# Patient Record
Sex: Female | Born: 1955 | Race: Black or African American | Hispanic: No | Smoking: Former smoker
Health system: Southern US, Community
[De-identification: ages and names within clinical notes are randomized; demographics above are authoritative.]

## PROBLEM LIST (undated history)

## (undated) DIAGNOSIS — M797 Fibromyalgia: Secondary | ICD-10-CM

## (undated) DIAGNOSIS — C50919 Malignant neoplasm of unspecified site of unspecified female breast: Secondary | ICD-10-CM

## (undated) DIAGNOSIS — E213 Hyperparathyroidism, unspecified: Secondary | ICD-10-CM

## (undated) DIAGNOSIS — I82409 Acute embolism and thrombosis of unspecified deep veins of unspecified lower extremity: Secondary | ICD-10-CM

## (undated) DIAGNOSIS — Z8673 Personal history of transient ischemic attack (TIA), and cerebral infarction without residual deficits: Secondary | ICD-10-CM

## (undated) DIAGNOSIS — E119 Type 2 diabetes mellitus without complications: Secondary | ICD-10-CM

## (undated) DIAGNOSIS — I5030 Unspecified diastolic (congestive) heart failure: Secondary | ICD-10-CM

## (undated) DIAGNOSIS — I10 Essential (primary) hypertension: Secondary | ICD-10-CM

## (undated) DIAGNOSIS — N289 Disorder of kidney and ureter, unspecified: Secondary | ICD-10-CM

## (undated) DIAGNOSIS — I639 Cerebral infarction, unspecified: Secondary | ICD-10-CM

## (undated) DIAGNOSIS — N189 Chronic kidney disease, unspecified: Secondary | ICD-10-CM

## (undated) HISTORY — DX: Hyperparathyroidism, unspecified: E21.3

## (undated) HISTORY — PX: OTHER SURGICAL HISTORY: SHX169

## (undated) HISTORY — DX: Acute embolism and thrombosis of unspecified deep veins of unspecified lower extremity: I82.409

## (undated) HISTORY — DX: Unspecified diastolic (congestive) heart failure: I50.30

## (undated) HISTORY — PX: APPENDECTOMY: SHX54

## (undated) HISTORY — DX: Type 2 diabetes mellitus without complications: E11.9

## (undated) HISTORY — DX: Cerebral infarction, unspecified: I63.9

## (undated) HISTORY — DX: Malignant neoplasm of unspecified site of unspecified female breast: C50.919

## (undated) HISTORY — DX: Fibromyalgia: M79.7

## (undated) HISTORY — DX: Chronic kidney disease, unspecified: N18.9

## (undated) HISTORY — DX: Personal history of transient ischemic attack (TIA), and cerebral infarction without residual deficits: Z86.73

## (undated) HISTORY — DX: Essential (primary) hypertension: I10

## (undated) HISTORY — PX: BREAST BIOPSY: SHX20

---

## 2012-05-27 ENCOUNTER — Inpatient Hospital Stay: Payer: Self-pay | Admitting: Internal Medicine

## 2012-05-27 LAB — COMPREHENSIVE METABOLIC PANEL
Albumin: 2.2 g/dL — ABNORMAL LOW (ref 3.4–5.0)
Alkaline Phosphatase: 110 U/L (ref 50–136)
Anion Gap: 6 — ABNORMAL LOW (ref 7–16)
Bilirubin,Total: 0.2 mg/dL (ref 0.2–1.0)
Calcium, Total: 8.3 mg/dL — ABNORMAL LOW (ref 8.5–10.1)
Co2: 25 mmol/L (ref 21–32)
Creatinine: 1.83 mg/dL — ABNORMAL HIGH (ref 0.60–1.30)
Glucose: 86 mg/dL (ref 65–99)
Osmolality: 290 (ref 275–301)
Potassium: 3.8 mmol/L (ref 3.5–5.1)
SGPT (ALT): 39 U/L (ref 12–78)
Sodium: 145 mmol/L (ref 136–145)
Total Protein: 6.8 g/dL (ref 6.4–8.2)

## 2012-05-27 LAB — URINALYSIS, COMPLETE
Bacteria: NONE SEEN
Blood: NEGATIVE
Nitrite: NEGATIVE
RBC,UR: 1 /HPF (ref 0–5)
Specific Gravity: 1.022 (ref 1.003–1.030)
WBC UR: 3 /HPF (ref 0–5)

## 2012-05-27 LAB — DRUG SCREEN, URINE
Amphetamines, Ur Screen: NEGATIVE (ref ?–1000)
Barbiturates, Ur Screen: NEGATIVE (ref ?–200)
Benzodiazepine, Ur Scrn: NEGATIVE (ref ?–200)
Cannabinoid 50 Ng, Ur ~~LOC~~: NEGATIVE (ref ?–50)
Cocaine Metabolite,Ur ~~LOC~~: POSITIVE (ref ?–300)
MDMA (Ecstasy)Ur Screen: NEGATIVE (ref ?–500)
Methadone, Ur Screen: NEGATIVE (ref ?–300)
Phencyclidine (PCP) Ur S: NEGATIVE (ref ?–25)
Tricyclic, Ur Screen: NEGATIVE (ref ?–1000)

## 2012-05-27 LAB — CBC
HGB: 12.1 g/dL (ref 12.0–16.0)
MCH: 29.3 pg (ref 26.0–34.0)
MCV: 88 fL (ref 80–100)
Platelet: 175 10*3/uL (ref 150–440)
RBC: 4.12 10*6/uL (ref 3.80–5.20)

## 2012-05-27 LAB — TROPONIN I: Troponin-I: 0.06 ng/mL — ABNORMAL HIGH

## 2012-05-27 LAB — CK TOTAL AND CKMB (NOT AT ARMC)
CK, Total: 222 U/L — ABNORMAL HIGH (ref 21–215)
CK-MB: 2.4 ng/mL (ref 0.5–3.6)

## 2012-05-28 LAB — CBC WITH DIFFERENTIAL/PLATELET
Basophil #: 0 10*3/uL (ref 0.0–0.1)
Basophil %: 0.5 %
Eosinophil %: 4.1 %
HCT: 29.7 % — ABNORMAL LOW (ref 35.0–47.0)
Lymphocyte #: 2.8 10*3/uL (ref 1.0–3.6)
Lymphocyte %: 57.3 %
MCHC: 34.2 g/dL (ref 32.0–36.0)
Monocyte #: 0.5 x10 3/mm (ref 0.2–0.9)
Monocyte %: 10.9 %
Neutrophil #: 1.3 10*3/uL — ABNORMAL LOW (ref 1.4–6.5)
Platelet: 136 10*3/uL — ABNORMAL LOW (ref 150–440)
RBC: 3.38 10*6/uL — ABNORMAL LOW (ref 3.80–5.20)
RDW: 14.4 % (ref 11.5–14.5)

## 2012-05-28 LAB — BASIC METABOLIC PANEL
Chloride: 114 mmol/L — ABNORMAL HIGH (ref 98–107)
Co2: 26 mmol/L (ref 21–32)
Creatinine: 2.06 mg/dL — ABNORMAL HIGH (ref 0.60–1.30)
EGFR (African American): 30 — ABNORMAL LOW
Glucose: 72 mg/dL (ref 65–99)
Osmolality: 289 (ref 275–301)
Sodium: 144 mmol/L (ref 136–145)

## 2012-05-28 LAB — LIPID PANEL
Cholesterol: 200 mg/dL (ref 0–200)
Ldl Cholesterol, Calc: 105 mg/dL — ABNORMAL HIGH (ref 0–100)
Triglycerides: 98 mg/dL (ref 0–200)
VLDL Cholesterol, Calc: 20 mg/dL (ref 5–40)

## 2012-05-28 LAB — TROPONIN I: Troponin-I: 0.06 ng/mL — ABNORMAL HIGH

## 2012-05-29 DIAGNOSIS — I369 Nonrheumatic tricuspid valve disorder, unspecified: Secondary | ICD-10-CM

## 2012-06-20 ENCOUNTER — Emergency Department: Payer: Self-pay | Admitting: Emergency Medicine

## 2012-06-20 LAB — CBC
HCT: 30.4 % — ABNORMAL LOW (ref 35.0–47.0)
HGB: 10.1 g/dL — ABNORMAL LOW (ref 12.0–16.0)
MCH: 29.3 pg (ref 26.0–34.0)
MCV: 88 fL (ref 80–100)
RDW: 13.6 % (ref 11.5–14.5)
WBC: 5.7 10*3/uL (ref 3.6–11.0)

## 2012-06-20 LAB — COMPREHENSIVE METABOLIC PANEL
Albumin: 2.3 g/dL — ABNORMAL LOW (ref 3.4–5.0)
Alkaline Phosphatase: 119 U/L (ref 50–136)
Anion Gap: 4 — ABNORMAL LOW (ref 7–16)
BUN: 36 mg/dL — ABNORMAL HIGH (ref 7–18)
Bilirubin,Total: 0.2 mg/dL (ref 0.2–1.0)
Calcium, Total: 8.3 mg/dL — ABNORMAL LOW (ref 8.5–10.1)
Chloride: 114 mmol/L — ABNORMAL HIGH (ref 98–107)
Co2: 23 mmol/L (ref 21–32)
Creatinine: 2.46 mg/dL — ABNORMAL HIGH (ref 0.60–1.30)
EGFR (African American): 24 — ABNORMAL LOW
EGFR (Non-African Amer.): 21 — ABNORMAL LOW
Glucose: 94 mg/dL (ref 65–99)
Osmolality: 289 (ref 275–301)
Potassium: 4.1 mmol/L (ref 3.5–5.1)
SGOT(AST): 41 U/L — ABNORMAL HIGH (ref 15–37)
SGPT (ALT): 30 U/L (ref 12–78)
Sodium: 141 mmol/L (ref 136–145)
Total Protein: 6.7 g/dL (ref 6.4–8.2)

## 2012-06-20 LAB — PRO B NATRIURETIC PEPTIDE: B-Type Natriuretic Peptide: 3170 pg/mL — ABNORMAL HIGH (ref 0–125)

## 2012-06-25 LAB — PRO B NATRIURETIC PEPTIDE: B-Type Natriuretic Peptide: 3717 pg/mL — ABNORMAL HIGH (ref 0–125)

## 2012-06-25 LAB — URINALYSIS, COMPLETE
Bilirubin,UR: NEGATIVE
Ketone: NEGATIVE
Nitrite: NEGATIVE
Protein: 100
Specific Gravity: 1.009 (ref 1.003–1.030)
Transitional Epi: <1
WBC UR: 1 /HPF (ref 0–5)

## 2012-06-25 LAB — COMPREHENSIVE METABOLIC PANEL
Albumin: 2.4 g/dL — ABNORMAL LOW (ref 3.4–5.0)
Alkaline Phosphatase: 116 U/L (ref 50–136)
Anion Gap: 6 — ABNORMAL LOW (ref 7–16)
Bilirubin,Total: 0.2 mg/dL (ref 0.2–1.0)
Calcium, Total: 8.4 mg/dL — ABNORMAL LOW (ref 8.5–10.1)
Creatinine: 1.81 mg/dL — ABNORMAL HIGH (ref 0.60–1.30)
EGFR (Non-African Amer.): 30 — ABNORMAL LOW
Glucose: 72 mg/dL (ref 65–99)
SGPT (ALT): 28 U/L (ref 12–78)
Sodium: 142 mmol/L (ref 136–145)

## 2012-06-25 LAB — CBC
HGB: 11 g/dL — ABNORMAL LOW (ref 12.0–16.0)
MCHC: 33.3 g/dL (ref 32.0–36.0)
Platelet: 192 10*3/uL (ref 150–440)
RDW: 13.8 % (ref 11.5–14.5)

## 2012-06-26 ENCOUNTER — Inpatient Hospital Stay: Payer: Self-pay | Admitting: Internal Medicine

## 2012-06-26 DIAGNOSIS — R780 Finding of alcohol in blood: Secondary | ICD-10-CM

## 2012-06-26 LAB — DRUG SCREEN, URINE
Amphetamines, Ur Screen: NEGATIVE (ref ?–1000)
Barbiturates, Ur Screen: NEGATIVE (ref ?–200)
Cocaine Metabolite,Ur ~~LOC~~: NEGATIVE (ref ?–300)
Opiate, Ur Screen: POSITIVE (ref ?–300)
Phencyclidine (PCP) Ur S: NEGATIVE (ref ?–25)
Tricyclic, Ur Screen: NEGATIVE (ref ?–1000)

## 2012-06-26 LAB — BASIC METABOLIC PANEL
Anion Gap: 7 (ref 7–16)
Co2: 23 mmol/L (ref 21–32)
Creatinine: 2.11 mg/dL — ABNORMAL HIGH (ref 0.60–1.30)
EGFR (African American): 29 — ABNORMAL LOW
EGFR (Non-African Amer.): 25 — ABNORMAL LOW
Glucose: 150 mg/dL — ABNORMAL HIGH (ref 65–99)
Osmolality: 286 (ref 275–301)
Potassium: 3.9 mmol/L (ref 3.5–5.1)
Sodium: 140 mmol/L (ref 136–145)

## 2012-06-26 LAB — TROPONIN I: Troponin-I: 0.41 ng/mL — ABNORMAL HIGH

## 2012-06-29 LAB — CREATININE, SERUM
EGFR (African American): 26 — ABNORMAL LOW
EGFR (Non-African Amer.): 23 — ABNORMAL LOW

## 2012-06-30 ENCOUNTER — Telehealth: Payer: Self-pay

## 2012-06-30 NOTE — Telephone Encounter (Signed)
TCM  

## 2012-06-30 NOTE — Telephone Encounter (Signed)
Message copied by [facility], Lynnelle Mesmer E on Fri Jun 30, 2012  9:30 AM ------      Message from: Coralee Rud      Created: Thu Jun 29, 2012 12:33 PM      Regarding: tcm/ph       07/18/12 2pm dr Kirke Corin ------

## 2012-06-30 NOTE — Telephone Encounter (Signed)
No answer TCM #1 attempt

## 2012-07-03 NOTE — Telephone Encounter (Signed)
TCM #2 No answer

## 2012-07-04 ENCOUNTER — Encounter: Payer: Self-pay | Admitting: *Deleted

## 2012-07-09 ENCOUNTER — Emergency Department: Payer: Self-pay | Admitting: Emergency Medicine

## 2012-07-18 ENCOUNTER — Encounter: Payer: Medicaid Other | Admitting: Cardiovascular Disease

## 2012-08-09 ENCOUNTER — Inpatient Hospital Stay: Payer: Self-pay | Admitting: Internal Medicine

## 2012-08-09 LAB — COMPREHENSIVE METABOLIC PANEL
Albumin: 2.9 g/dL — ABNORMAL LOW (ref 3.4–5.0)
Alkaline Phosphatase: 95 U/L (ref 50–136)
BUN: 19 mg/dL — ABNORMAL HIGH (ref 7–18)
Bilirubin,Total: 0.4 mg/dL (ref 0.2–1.0)
Calcium, Total: 8.5 mg/dL (ref 8.5–10.1)
Chloride: 110 mmol/L — ABNORMAL HIGH (ref 98–107)
EGFR (Non-African Amer.): 27 — ABNORMAL LOW
Glucose: 84 mg/dL (ref 65–99)
Osmolality: 283 (ref 275–301)
Potassium: 4.9 mmol/L (ref 3.5–5.1)
Total Protein: 7.2 g/dL (ref 6.4–8.2)

## 2012-08-09 LAB — URINALYSIS, COMPLETE
Bilirubin,UR: NEGATIVE
Glucose,UR: 50 mg/dL (ref 0–75)
Ketone: NEGATIVE
Nitrite: NEGATIVE
Ph: 7 (ref 4.5–8.0)
RBC,UR: <1 /HPF (ref 0–5)
Specific Gravity: 1.016 (ref 1.003–1.030)
Squamous Epithelial: 1

## 2012-08-09 LAB — CBC
HCT: 31.4 % — ABNORMAL LOW (ref 35.0–47.0)
HGB: 10.3 g/dL — ABNORMAL LOW (ref 12.0–16.0)
MCHC: 32.8 g/dL (ref 32.0–36.0)
Platelet: 198 10*3/uL (ref 150–440)
RBC: 3.56 10*6/uL — ABNORMAL LOW (ref 3.80–5.20)

## 2012-08-09 LAB — PROTIME-INR: Prothrombin Time: 12.6 secs (ref 11.5–14.7)

## 2012-08-10 LAB — LIPID PANEL
Cholesterol: 142 mg/dL (ref 0–200)
HDL Cholesterol: 79 mg/dL — ABNORMAL HIGH (ref 40–60)
Ldl Cholesterol, Calc: 47 mg/dL (ref 0–100)
VLDL Cholesterol, Calc: 16 mg/dL (ref 5–40)

## 2012-08-12 ENCOUNTER — Emergency Department: Payer: Self-pay | Admitting: Internal Medicine

## 2012-08-12 LAB — CBC WITH DIFFERENTIAL/PLATELET
HCT: 29.5 % — ABNORMAL LOW (ref 35.0–47.0)
HGB: 9.5 g/dL — ABNORMAL LOW (ref 12.0–16.0)
Lymphocyte #: 2.3 10*3/uL (ref 1.0–3.6)
Lymphocyte %: 46.1 %
MCH: 28.8 pg (ref 26.0–34.0)
MCHC: 32.3 g/dL (ref 32.0–36.0)
MCV: 89 fL (ref 80–100)
Neutrophil #: 1.8 10*3/uL (ref 1.4–6.5)
Neutrophil %: 35.7 %
RBC: 3.31 10*6/uL — ABNORMAL LOW (ref 3.80–5.20)

## 2012-08-12 LAB — TSH: Thyroid Stimulating Horm: 1.08 u[IU]/mL

## 2012-08-12 LAB — BASIC METABOLIC PANEL
Anion Gap: 6 — ABNORMAL LOW (ref 7–16)
BUN: 21 mg/dL — ABNORMAL HIGH (ref 7–18)
Chloride: 112 mmol/L — ABNORMAL HIGH (ref 98–107)
Creatinine: 2.35 mg/dL — ABNORMAL HIGH (ref 0.60–1.30)
EGFR (Non-African Amer.): 22 — ABNORMAL LOW
Glucose: 83 mg/dL (ref 65–99)
Potassium: 3.7 mmol/L (ref 3.5–5.1)

## 2012-08-12 LAB — URINALYSIS, COMPLETE
Bacteria: NONE SEEN
Blood: NEGATIVE
Glucose,UR: NEGATIVE mg/dL (ref 0–75)
Leukocyte Esterase: NEGATIVE
Nitrite: NEGATIVE
Protein: 100
RBC,UR: 1 /HPF (ref 0–5)
Specific Gravity: 1.006 (ref 1.003–1.030)
Squamous Epithelial: <1

## 2012-08-12 LAB — DRUG SCREEN, URINE

## 2012-08-12 LAB — HEPATIC FUNCTION PANEL A (ARMC)
Alkaline Phosphatase: 89 U/L (ref 50–136)
Bilirubin, Direct: 0.1 mg/dL (ref 0.00–0.20)
SGPT (ALT): 21 U/L (ref 12–78)

## 2012-08-12 LAB — TROPONIN I: Troponin-I: <0.02 ng/mL

## 2012-08-12 LAB — ACETAMINOPHEN LEVEL: Acetaminophen: <2 ug/mL

## 2012-09-21 ENCOUNTER — Emergency Department: Payer: Self-pay | Admitting: Emergency Medicine

## 2012-09-21 LAB — CBC
HGB: 11.8 g/dL — ABNORMAL LOW (ref 12.0–16.0)
MCH: 30.3 pg (ref 26.0–34.0)
MCHC: 34.2 g/dL (ref 32.0–36.0)
MCV: 89 fL (ref 80–100)
RBC: 3.9 10*6/uL (ref 3.80–5.20)

## 2012-09-21 LAB — BASIC METABOLIC PANEL
Calcium, Total: 8.7 mg/dL (ref 8.5–10.1)
Chloride: 110 mmol/L — ABNORMAL HIGH (ref 98–107)
Co2: 24 mmol/L (ref 21–32)
EGFR (African American): 29 — ABNORMAL LOW
EGFR (Non-African Amer.): 25 — ABNORMAL LOW
Glucose: 88 mg/dL (ref 65–99)
Osmolality: 283 (ref 275–301)
Potassium: 3.8 mmol/L (ref 3.5–5.1)

## 2012-09-21 LAB — CK TOTAL AND CKMB (NOT AT ARMC)
CK, Total: 83 U/L (ref 21–215)
CK-MB: 1.5 ng/mL (ref 0.5–3.6)

## 2012-09-21 LAB — URINALYSIS, COMPLETE
Bacteria: NONE SEEN
Bilirubin,UR: NEGATIVE
Blood: NEGATIVE
Ketone: NEGATIVE
Specific Gravity: 1.02 (ref 1.003–1.030)
WBC UR: 2 /HPF (ref 0–5)

## 2012-12-08 ENCOUNTER — Ambulatory Visit: Payer: Self-pay | Admitting: Nephrology

## 2013-03-14 ENCOUNTER — Ambulatory Visit: Payer: Self-pay | Admitting: Family Medicine

## 2013-03-27 ENCOUNTER — Ambulatory Visit: Payer: Self-pay | Admitting: Family Medicine

## 2013-03-30 LAB — PATHOLOGY REPORT

## 2013-04-16 ENCOUNTER — Ambulatory Visit: Payer: Self-pay | Admitting: Surgery

## 2013-04-16 LAB — BASIC METABOLIC PANEL
ANION GAP: 6 — AB (ref 7–16)
BUN: 28 mg/dL — ABNORMAL HIGH (ref 7–18)
CALCIUM: 8.2 mg/dL — AB (ref 8.5–10.1)
CO2: 23 mmol/L (ref 21–32)
Chloride: 113 mmol/L — ABNORMAL HIGH (ref 98–107)
Creatinine: 2.78 mg/dL — ABNORMAL HIGH (ref 0.60–1.30)
GFR CALC AF AMER: 21 — AB
GFR CALC NON AF AMER: 18 — AB
Glucose: 230 mg/dL — ABNORMAL HIGH (ref 65–99)
Osmolality: 296 (ref 275–301)
Potassium: 4 mmol/L (ref 3.5–5.1)
SODIUM: 142 mmol/L (ref 136–145)

## 2013-04-16 LAB — CBC
HCT: 32.1 % — AB (ref 35.0–47.0)
HGB: 10.8 g/dL — AB (ref 12.0–16.0)
MCH: 29.3 pg (ref 26.0–34.0)
MCHC: 33.7 g/dL (ref 32.0–36.0)
MCV: 87 fL (ref 80–100)
Platelet: 166 10*3/uL (ref 150–440)
RBC: 3.69 10*6/uL — ABNORMAL LOW (ref 3.80–5.20)
RDW: 13.6 % (ref 11.5–14.5)
WBC: 5.3 10*3/uL (ref 3.6–11.0)

## 2013-04-16 LAB — PROTIME-INR
INR: 1
Prothrombin Time: 13 secs (ref 11.5–14.7)

## 2013-05-25 ENCOUNTER — Ambulatory Visit: Payer: Self-pay | Admitting: Surgery

## 2013-05-25 HISTORY — PX: BREAST LUMPECTOMY: SHX2

## 2013-05-25 LAB — DRUG SCREEN, URINE

## 2013-06-04 LAB — PATHOLOGY REPORT

## 2013-06-08 ENCOUNTER — Ambulatory Visit: Payer: Self-pay | Admitting: Internal Medicine

## 2013-06-13 ENCOUNTER — Ambulatory Visit: Payer: Self-pay | Admitting: Internal Medicine

## 2013-06-13 DIAGNOSIS — I059 Rheumatic mitral valve disease, unspecified: Secondary | ICD-10-CM

## 2013-06-19 ENCOUNTER — Ambulatory Visit: Payer: Self-pay | Admitting: Surgery

## 2013-06-25 ENCOUNTER — Ambulatory Visit: Payer: Self-pay | Admitting: Internal Medicine

## 2013-06-26 LAB — CBC CANCER CENTER
Basophil #: 0 x10 3/mm (ref 0.0–0.1)
Basophil %: 0.5 %
Eosinophil #: 0.3 x10 3/mm (ref 0.0–0.7)
Eosinophil %: 5 %
HCT: 32.2 % — AB (ref 35.0–47.0)
HGB: 10.5 g/dL — AB (ref 12.0–16.0)
LYMPHS PCT: 48.6 %
Lymphocyte #: 3.2 x10 3/mm (ref 1.0–3.6)
MCH: 28.2 pg (ref 26.0–34.0)
MCHC: 32.5 g/dL (ref 32.0–36.0)
MCV: 87 fL (ref 80–100)
MONO ABS: 0.6 x10 3/mm (ref 0.2–0.9)
Monocyte %: 10 %
NEUTROS ABS: 2.3 x10 3/mm (ref 1.4–6.5)
Neutrophil %: 35.9 %
Platelet: 201 x10 3/mm (ref 150–440)
RBC: 3.72 10*6/uL — AB (ref 3.80–5.20)
RDW: 14.5 % (ref 11.5–14.5)
WBC: 6.5 x10 3/mm (ref 3.6–11.0)

## 2013-06-26 LAB — HEPATIC FUNCTION PANEL A (ARMC)
Albumin: 2.7 g/dL — ABNORMAL LOW (ref 3.4–5.0)
Alkaline Phosphatase: 96 U/L
BILIRUBIN DIRECT: 0.1 mg/dL (ref 0.00–0.20)
Bilirubin,Total: 0.2 mg/dL (ref 0.2–1.0)
SGOT(AST): 29 U/L (ref 15–37)
SGPT (ALT): 30 U/L (ref 12–78)
TOTAL PROTEIN: 7.4 g/dL (ref 6.4–8.2)

## 2013-06-26 LAB — CREATININE, SERUM
Creatinine: 3.26 mg/dL — ABNORMAL HIGH (ref 0.60–1.30)
EGFR (African American): 17 — ABNORMAL LOW
GFR CALC NON AF AMER: 15 — AB

## 2013-06-29 ENCOUNTER — Telehealth: Payer: Self-pay

## 2013-06-29 NOTE — Telephone Encounter (Signed)
LMOM to find out PCP

## 2013-07-02 ENCOUNTER — Ambulatory Visit: Payer: Self-pay | Admitting: Nephrology

## 2013-07-03 LAB — CBC CANCER CENTER
BASOS PCT: 0.7 %
Basophil #: 0 x10 3/mm (ref 0.0–0.1)
Eosinophil #: 0.3 x10 3/mm (ref 0.0–0.7)
Eosinophil %: 5.9 %
HCT: 33.5 % — ABNORMAL LOW (ref 35.0–47.0)
HGB: 11 g/dL — AB (ref 12.0–16.0)
LYMPHS ABS: 2.6 x10 3/mm (ref 1.0–3.6)
Lymphocyte %: 45.6 %
MCH: 28.5 pg (ref 26.0–34.0)
MCHC: 32.9 g/dL (ref 32.0–36.0)
MCV: 86 fL (ref 80–100)
Monocyte #: 0.6 x10 3/mm (ref 0.2–0.9)
Monocyte %: 9.9 %
Neutrophil #: 2.1 x10 3/mm (ref 1.4–6.5)
Neutrophil %: 37.9 %
Platelet: 229 x10 3/mm (ref 150–440)
RBC: 3.87 10*6/uL (ref 3.80–5.20)
RDW: 14.2 % (ref 11.5–14.5)
WBC: 5.7 x10 3/mm (ref 3.6–11.0)

## 2013-07-03 LAB — BASIC METABOLIC PANEL
Anion Gap: 9 (ref 7–16)
BUN: 39 mg/dL — AB (ref 7–18)
CHLORIDE: 109 mmol/L — AB (ref 98–107)
Calcium, Total: 8.5 mg/dL (ref 8.5–10.1)
Co2: 23 mmol/L (ref 21–32)
Creatinine: 3.32 mg/dL — ABNORMAL HIGH (ref 0.60–1.30)
EGFR (African American): 17 — ABNORMAL LOW
GFR CALC NON AF AMER: 15 — AB
Glucose: 105 mg/dL — ABNORMAL HIGH (ref 65–99)
Osmolality: 291 (ref 275–301)
POTASSIUM: 4.8 mmol/L (ref 3.5–5.1)
Sodium: 141 mmol/L (ref 136–145)

## 2013-07-10 LAB — CBC CANCER CENTER
BASOS ABS: 0 x10 3/mm (ref 0.0–0.1)
Basophil %: 0.6 %
Eosinophil #: 0.4 x10 3/mm (ref 0.0–0.7)
Eosinophil %: 12.5 %
HCT: 30 % — AB (ref 35.0–47.0)
HGB: 9.7 g/dL — AB (ref 12.0–16.0)
Lymphocyte #: 1.4 x10 3/mm (ref 1.0–3.6)
Lymphocyte %: 49.9 %
MCH: 28.3 pg (ref 26.0–34.0)
MCHC: 32.3 g/dL (ref 32.0–36.0)
MCV: 88 fL (ref 80–100)
MONOS PCT: 14.9 %
Monocyte #: 0.4 x10 3/mm (ref 0.2–0.9)
NEUTROS ABS: 0.6 x10 3/mm — AB (ref 1.4–6.5)
NEUTROS PCT: 22.1 %
PLATELETS: 91 x10 3/mm — AB (ref 150–440)
RBC: 3.42 10*6/uL — ABNORMAL LOW (ref 3.80–5.20)
RDW: 14 % (ref 11.5–14.5)
WBC: 2.8 x10 3/mm — AB (ref 3.6–11.0)

## 2013-07-10 LAB — HEPATIC FUNCTION PANEL A (ARMC)
ALT: 34 U/L (ref 12–78)
AST: 26 U/L (ref 15–37)
Albumin: 2.5 g/dL — ABNORMAL LOW (ref 3.4–5.0)
Alkaline Phosphatase: 87 U/L
Bilirubin, Direct: 0.1 mg/dL (ref 0.00–0.20)
Bilirubin,Total: 0.2 mg/dL (ref 0.2–1.0)
Total Protein: 6.6 g/dL (ref 6.4–8.2)

## 2013-07-10 LAB — CREATININE, SERUM
Creatinine: 3.21 mg/dL — ABNORMAL HIGH (ref 0.60–1.30)
EGFR (African American): 18 — ABNORMAL LOW
EGFR (Non-African Amer.): 15 — ABNORMAL LOW

## 2013-07-11 ENCOUNTER — Ambulatory Visit: Payer: Self-pay | Admitting: Vascular Surgery

## 2013-07-11 LAB — URINALYSIS, COMPLETE
BILIRUBIN, UR: NEGATIVE
Glucose,UR: 50 mg/dL (ref 0–75)
KETONE: NEGATIVE
LEUKOCYTE ESTERASE: NEGATIVE
NITRITE: NEGATIVE
PH: 6 (ref 4.5–8.0)
Protein: >=500
RBC,UR: 15 /HPF (ref 0–5)
Specific Gravity: 1.014 (ref 1.003–1.030)
Squamous Epithelial: 1

## 2013-07-11 LAB — CBC
HCT: 28.9 % — ABNORMAL LOW (ref 35.0–47.0)
HGB: 9.5 g/dL — AB (ref 12.0–16.0)
MCH: 28.8 pg (ref 26.0–34.0)
MCHC: 33 g/dL (ref 32.0–36.0)
MCV: 87 fL (ref 80–100)
Platelet: 95 10*3/uL — ABNORMAL LOW (ref 150–440)
RBC: 3.32 10*6/uL — AB (ref 3.80–5.20)
RDW: 14.1 % (ref 11.5–14.5)
WBC: 6.1 10*3/uL (ref 3.6–11.0)

## 2013-07-11 LAB — BASIC METABOLIC PANEL
ANION GAP: 6 — AB (ref 7–16)
BUN: 38 mg/dL — ABNORMAL HIGH (ref 7–18)
CHLORIDE: 109 mmol/L — AB (ref 98–107)
Calcium, Total: 8.7 mg/dL (ref 8.5–10.1)
Co2: 24 mmol/L (ref 21–32)
Creatinine: 3.35 mg/dL — ABNORMAL HIGH (ref 0.60–1.30)
EGFR (African American): 17 — ABNORMAL LOW
GFR CALC NON AF AMER: 14 — AB
Glucose: 127 mg/dL — ABNORMAL HIGH (ref 65–99)
OSMOLALITY: 288 (ref 275–301)
Potassium: 4.6 mmol/L (ref 3.5–5.1)
SODIUM: 139 mmol/L (ref 136–145)

## 2013-07-12 ENCOUNTER — Emergency Department: Payer: Self-pay | Admitting: Emergency Medicine

## 2013-07-12 LAB — CBC WITH DIFFERENTIAL/PLATELET
Bands: 3 %
EOS PCT: 3 %
HCT: 28.8 % — AB (ref 35.0–47.0)
HGB: 9.5 g/dL — AB (ref 12.0–16.0)
LYMPHS PCT: 31 %
MCH: 28.7 pg (ref 26.0–34.0)
MCHC: 33 g/dL (ref 32.0–36.0)
MCV: 87 fL (ref 80–100)
METAMYELOCYTE: 1 %
Monocytes: 4 %
Myelocyte: 1 %
PLATELETS: 106 10*3/uL — AB (ref 150–440)
RBC: 3.31 10*6/uL — ABNORMAL LOW (ref 3.80–5.20)
RDW: 14.2 % (ref 11.5–14.5)
Segmented Neutrophils: 57 %
WBC: 9.2 10*3/uL (ref 3.6–11.0)

## 2013-07-12 LAB — COMPREHENSIVE METABOLIC PANEL
ALBUMIN: 2.5 g/dL — AB (ref 3.4–5.0)
ALT: 38 U/L (ref 12–78)
AST: 45 U/L — AB (ref 15–37)
Alkaline Phosphatase: 96 U/L
Anion Gap: 7 (ref 7–16)
BUN: 35 mg/dL — AB (ref 7–18)
Bilirubin,Total: 0.3 mg/dL (ref 0.2–1.0)
CHLORIDE: 109 mmol/L — AB (ref 98–107)
Calcium, Total: 8.2 mg/dL — ABNORMAL LOW (ref 8.5–10.1)
Co2: 23 mmol/L (ref 21–32)
Creatinine: 3.44 mg/dL — ABNORMAL HIGH (ref 0.60–1.30)
EGFR (African American): 16 — ABNORMAL LOW
EGFR (Non-African Amer.): 14 — ABNORMAL LOW
GLUCOSE: 87 mg/dL (ref 65–99)
Osmolality: 285 (ref 275–301)
Potassium: 5.1 mmol/L (ref 3.5–5.1)
Sodium: 139 mmol/L (ref 136–145)
Total Protein: 6.8 g/dL (ref 6.4–8.2)

## 2013-07-12 LAB — TROPONIN I

## 2013-07-17 LAB — CBC CANCER CENTER
Basophil #: 0.1 x10 3/mm (ref 0.0–0.1)
Basophil %: 1 %
Eosinophil #: 0.1 x10 3/mm (ref 0.0–0.7)
Eosinophil %: 1 %
HCT: 30.2 % — ABNORMAL LOW (ref 35.0–47.0)
HGB: 9.9 g/dL — ABNORMAL LOW (ref 12.0–16.0)
LYMPHS PCT: 28.2 %
Lymphocyte #: 2 x10 3/mm (ref 1.0–3.6)
MCH: 28 pg (ref 26.0–34.0)
MCHC: 32.8 g/dL (ref 32.0–36.0)
MCV: 85 fL (ref 80–100)
MONOS PCT: 14.4 %
Monocyte #: 1 x10 3/mm — ABNORMAL HIGH (ref 0.2–0.9)
NEUTROS PCT: 55.4 %
Neutrophil #: 3.9 x10 3/mm (ref 1.4–6.5)
PLATELETS: 147 x10 3/mm — AB (ref 150–440)
RBC: 3.53 10*6/uL — ABNORMAL LOW (ref 3.80–5.20)
RDW: 14.2 % (ref 11.5–14.5)
WBC: 7 x10 3/mm (ref 3.6–11.0)

## 2013-07-17 LAB — CREATININE, SERUM
Creatinine: 3.02 mg/dL — ABNORMAL HIGH (ref 0.60–1.30)
GFR CALC AF AMER: 19 — AB
GFR CALC NON AF AMER: 16 — AB

## 2013-07-19 ENCOUNTER — Ambulatory Visit: Payer: Self-pay | Admitting: Vascular Surgery

## 2013-07-25 ENCOUNTER — Ambulatory Visit: Payer: Self-pay | Admitting: Internal Medicine

## 2013-07-25 LAB — BASIC METABOLIC PANEL
ANION GAP: 11 (ref 7–16)
BUN: 42 mg/dL — ABNORMAL HIGH (ref 7–18)
CALCIUM: 8.2 mg/dL — AB (ref 8.5–10.1)
CO2: 20 mmol/L — AB (ref 21–32)
CREATININE: 3.45 mg/dL — AB (ref 0.60–1.30)
Chloride: 111 mmol/L — ABNORMAL HIGH (ref 98–107)
EGFR (African American): 16 — ABNORMAL LOW
GFR CALC NON AF AMER: 14 — AB
Glucose: 158 mg/dL — ABNORMAL HIGH (ref 65–99)
OSMOLALITY: 297 (ref 275–301)
Potassium: 4.6 mmol/L (ref 3.5–5.1)
Sodium: 142 mmol/L (ref 136–145)

## 2013-07-25 LAB — CBC CANCER CENTER
Basophil #: 0.1 x10 3/mm (ref 0.0–0.1)
Basophil %: 1.4 %
Eosinophil #: 0.1 x10 3/mm (ref 0.0–0.7)
Eosinophil %: 1 %
HCT: 28.9 % — ABNORMAL LOW (ref 35.0–47.0)
HGB: 9.4 g/dL — ABNORMAL LOW (ref 12.0–16.0)
LYMPHS PCT: 39.6 %
Lymphocyte #: 2.1 x10 3/mm (ref 1.0–3.6)
MCH: 28.4 pg (ref 26.0–34.0)
MCHC: 32.6 g/dL (ref 32.0–36.0)
MCV: 87 fL (ref 80–100)
Monocyte #: 0.8 x10 3/mm (ref 0.2–0.9)
Monocyte %: 15.3 %
NEUTROS PCT: 42.7 %
Neutrophil #: 2.3 x10 3/mm (ref 1.4–6.5)
PLATELETS: 296 x10 3/mm (ref 150–440)
RBC: 3.33 10*6/uL — ABNORMAL LOW (ref 3.80–5.20)
RDW: 14.9 % — ABNORMAL HIGH (ref 11.5–14.5)
WBC: 5.4 x10 3/mm (ref 3.6–11.0)

## 2013-07-25 LAB — HEPATIC FUNCTION PANEL A (ARMC)
ALBUMIN: 2.3 g/dL — AB (ref 3.4–5.0)
ALT: 40 U/L (ref 12–78)
AST: 35 U/L (ref 15–37)
Alkaline Phosphatase: 99 U/L
Bilirubin, Direct: <0.05 mg/dL (ref 0.00–0.20)
Bilirubin,Total: 0.2 mg/dL (ref 0.2–1.0)
Total Protein: 6.9 g/dL (ref 6.4–8.2)

## 2013-08-01 LAB — CBC CANCER CENTER
BASOS PCT: 1.2 %
Basophil #: 0 x10 3/mm (ref 0.0–0.1)
EOS ABS: 0.3 x10 3/mm (ref 0.0–0.7)
Eosinophil %: 10.4 %
HCT: 25.6 % — AB (ref 35.0–47.0)
HGB: 8.3 g/dL — ABNORMAL LOW (ref 12.0–16.0)
LYMPHS PCT: 41.5 %
Lymphocyte #: 1.2 x10 3/mm (ref 1.0–3.6)
MCH: 28.5 pg (ref 26.0–34.0)
MCHC: 32.4 g/dL (ref 32.0–36.0)
MCV: 88 fL (ref 80–100)
MONO ABS: 0.4 x10 3/mm (ref 0.2–0.9)
Monocyte %: 14.5 %
Neutrophil #: 1 x10 3/mm — ABNORMAL LOW (ref 1.4–6.5)
Neutrophil %: 32.4 %
PLATELETS: 76 x10 3/mm — AB (ref 150–440)
RBC: 2.91 10*6/uL — ABNORMAL LOW (ref 3.80–5.20)
RDW: 15.4 % — ABNORMAL HIGH (ref 11.5–14.5)
WBC: 3 x10 3/mm — ABNORMAL LOW (ref 3.6–11.0)

## 2013-08-01 LAB — CREATININE, SERUM
Creatinine: 3.25 mg/dL — ABNORMAL HIGH (ref 0.60–1.30)
EGFR (African American): 17 — ABNORMAL LOW
EGFR (Non-African Amer.): 15 — ABNORMAL LOW

## 2013-08-08 LAB — CBC CANCER CENTER
BASOS ABS: 0 x10 3/mm (ref 0.0–0.1)
Basophil %: 0.8 %
Eosinophil #: 0.1 x10 3/mm (ref 0.0–0.7)
Eosinophil %: 2.7 %
HCT: 28.2 % — AB (ref 35.0–47.0)
HGB: 9.2 g/dL — AB (ref 12.0–16.0)
LYMPHS ABS: 1.5 x10 3/mm (ref 1.0–3.6)
Lymphocyte %: 27.2 %
MCH: 28.6 pg (ref 26.0–34.0)
MCHC: 32.5 g/dL (ref 32.0–36.0)
MCV: 88 fL (ref 80–100)
Monocyte #: 1 x10 3/mm — ABNORMAL HIGH (ref 0.2–0.9)
Monocyte %: 18.1 %
NEUTROS PCT: 51.2 %
Neutrophil #: 2.8 x10 3/mm (ref 1.4–6.5)
Platelet: 152 x10 3/mm (ref 150–440)
RBC: 3.2 10*6/uL — AB (ref 3.80–5.20)
RDW: 15.3 % — ABNORMAL HIGH (ref 11.5–14.5)
WBC: 5.5 x10 3/mm (ref 3.6–11.0)

## 2013-08-08 LAB — CREATININE, SERUM
Creatinine: 3.4 mg/dL — ABNORMAL HIGH (ref 0.60–1.30)
EGFR (Non-African Amer.): 14 — ABNORMAL LOW
GFR CALC AF AMER: 16 — AB

## 2013-08-15 ENCOUNTER — Ambulatory Visit: Payer: Self-pay | Admitting: Nephrology

## 2013-08-15 LAB — CBC CANCER CENTER
BASOS PCT: 2.1 %
Basophil #: 0.1 x10 3/mm (ref 0.0–0.1)
EOS PCT: 2 %
Eosinophil #: 0.1 x10 3/mm (ref 0.0–0.7)
HCT: 27.7 % — AB (ref 35.0–47.0)
HGB: 9 g/dL — ABNORMAL LOW (ref 12.0–16.0)
LYMPHS PCT: 41.6 %
Lymphocyte #: 1.6 x10 3/mm (ref 1.0–3.6)
MCH: 28.7 pg (ref 26.0–34.0)
MCHC: 32.3 g/dL (ref 32.0–36.0)
MCV: 89 fL (ref 80–100)
MONO ABS: 0.8 x10 3/mm (ref 0.2–0.9)
Monocyte %: 20.9 %
NEUTROS PCT: 33.4 %
Neutrophil #: 1.3 x10 3/mm — ABNORMAL LOW (ref 1.4–6.5)
Platelet: 248 x10 3/mm (ref 150–440)
RBC: 3.12 10*6/uL — AB (ref 3.80–5.20)
RDW: 17 % — AB (ref 11.5–14.5)
WBC: 3.9 x10 3/mm (ref 3.6–11.0)

## 2013-08-15 LAB — BASIC METABOLIC PANEL
Anion Gap: 9 (ref 7–16)
BUN: 33 mg/dL — AB (ref 7–18)
CO2: 25 mmol/L (ref 21–32)
Calcium, Total: 8.4 mg/dL — ABNORMAL LOW (ref 8.5–10.1)
Chloride: 108 mmol/L — ABNORMAL HIGH (ref 98–107)
Creatinine: 3.28 mg/dL — ABNORMAL HIGH (ref 0.60–1.30)
GFR CALC AF AMER: 17 — AB
GFR CALC NON AF AMER: 15 — AB
GLUCOSE: 112 mg/dL — AB (ref 65–99)
OSMOLALITY: 291 (ref 275–301)
POTASSIUM: 4.1 mmol/L (ref 3.5–5.1)
SODIUM: 142 mmol/L (ref 136–145)

## 2013-08-15 LAB — PHOSPHORUS: Phosphorus: 4.9 mg/dL (ref 2.5–4.9)

## 2013-08-15 LAB — HEPATIC FUNCTION PANEL A (ARMC)
ALT: 49 U/L
Albumin: 2.4 g/dL — ABNORMAL LOW (ref 3.4–5.0)
Alkaline Phosphatase: 101 U/L
Bilirubin, Direct: <0.1 mg/dL (ref 0.00–0.20)
Bilirubin,Total: <0.1 mg/dL — ABNORMAL LOW (ref 0.2–1.0)
SGOT(AST): 40 U/L — ABNORMAL HIGH (ref 15–37)
TOTAL PROTEIN: 6.5 g/dL (ref 6.4–8.2)

## 2013-08-22 LAB — CBC CANCER CENTER
BASOS ABS: 0.1 x10 3/mm (ref 0.0–0.1)
Basophil %: 1.3 %
EOS ABS: 0.2 x10 3/mm (ref 0.0–0.7)
Eosinophil %: 4.3 %
HCT: 29 % — AB (ref 35.0–47.0)
HGB: 9.7 g/dL — ABNORMAL LOW (ref 12.0–16.0)
LYMPHS ABS: 2 x10 3/mm (ref 1.0–3.6)
LYMPHS PCT: 46.8 %
MCH: 29.5 pg (ref 26.0–34.0)
MCHC: 33.2 g/dL (ref 32.0–36.0)
MCV: 89 fL (ref 80–100)
MONOS PCT: 13.5 %
Monocyte #: 0.6 x10 3/mm (ref 0.2–0.9)
NEUTROS ABS: 1.4 x10 3/mm (ref 1.4–6.5)
NEUTROS PCT: 34.1 %
Platelet: 227 x10 3/mm (ref 150–440)
RBC: 3.27 10*6/uL — AB (ref 3.80–5.20)
RDW: 17.3 % — ABNORMAL HIGH (ref 11.5–14.5)
WBC: 4.2 x10 3/mm (ref 3.6–11.0)

## 2013-08-22 LAB — CREATININE, SERUM
Creatinine: 3.71 mg/dL — ABNORMAL HIGH (ref 0.60–1.30)
EGFR (Non-African Amer.): 13 — ABNORMAL LOW
GFR CALC AF AMER: 15 — AB

## 2013-08-25 ENCOUNTER — Ambulatory Visit: Payer: Self-pay | Admitting: Internal Medicine

## 2013-08-29 LAB — CREATININE, SERUM
Creatinine: 3.36 mg/dL — ABNORMAL HIGH (ref 0.60–1.30)
GFR CALC AF AMER: 17 — AB
GFR CALC NON AF AMER: 14 — AB

## 2013-08-29 LAB — CBC CANCER CENTER
BASOS ABS: 0 x10 3/mm (ref 0.0–0.1)
Basophil %: 1.2 %
Eosinophil #: 0.4 x10 3/mm (ref 0.0–0.7)
Eosinophil %: 19.7 %
HCT: 27.3 % — ABNORMAL LOW (ref 35.0–47.0)
HGB: 8.9 g/dL — AB (ref 12.0–16.0)
LYMPHS ABS: 0.8 x10 3/mm — AB (ref 1.0–3.6)
LYMPHS PCT: 44.6 %
MCH: 29.4 pg (ref 26.0–34.0)
MCHC: 32.7 g/dL (ref 32.0–36.0)
MCV: 90 fL (ref 80–100)
MONO ABS: 0.3 x10 3/mm (ref 0.2–0.9)
MONOS PCT: 15.6 %
NEUTROS PCT: 18.9 %
Neutrophil #: 0.3 x10 3/mm — ABNORMAL LOW (ref 1.4–6.5)
Platelet: 59 x10 3/mm — ABNORMAL LOW (ref 150–440)
RBC: 3.04 10*6/uL — AB (ref 3.80–5.20)
RDW: 16.3 % — AB (ref 11.5–14.5)
WBC: 1.8 x10 3/mm — CL (ref 3.6–11.0)

## 2013-09-05 LAB — CBC CANCER CENTER
Basophil #: 0 10*3/uL
Basophil %: 0.9 %
Eosinophil #: 0.1 10*3/uL
Eosinophil %: 2.1 %
HCT: 28.9 % — ABNORMAL LOW
HGB: 9.3 g/dL — ABNORMAL LOW
Lymphocyte %: 28.8 %
Lymphs Abs: 1.3 10*3/uL
MCH: 29.1 pg
MCHC: 32.4 g/dL
MCV: 90 fL
Monocyte #: 1.2 10*3/uL — ABNORMAL HIGH
Monocyte %: 26.4 %
Neutrophil #: 1.8 10*3/uL
Neutrophil %: 41.8 %
Platelet: 159 10*3/uL
RBC: 3.21 10*6/uL — ABNORMAL LOW
RDW: 16.6 % — ABNORMAL HIGH
WBC: 4.4 10*3/uL

## 2013-09-05 LAB — CREATININE, SERUM
Creatinine: 3.79 mg/dL — ABNORMAL HIGH (ref 0.60–1.30)
EGFR (Non-African Amer.): 12 — ABNORMAL LOW
GFR CALC AF AMER: 14 — AB

## 2013-09-12 LAB — CBC CANCER CENTER
BASOS ABS: 0.1 x10 3/mm (ref 0.0–0.1)
Basophil %: 1.9 %
EOS PCT: 1.5 %
Eosinophil #: 0 x10 3/mm (ref 0.0–0.7)
HCT: 27.4 % — ABNORMAL LOW (ref 35.0–47.0)
HGB: 8.9 g/dL — AB (ref 12.0–16.0)
LYMPHS PCT: 52.6 %
Lymphocyte #: 1.6 x10 3/mm (ref 1.0–3.6)
MCH: 29.6 pg (ref 26.0–34.0)
MCHC: 32.6 g/dL (ref 32.0–36.0)
MCV: 91 fL (ref 80–100)
MONO ABS: 0.6 x10 3/mm (ref 0.2–0.9)
Monocyte %: 20.2 %
Neutrophil #: 0.7 x10 3/mm — ABNORMAL LOW (ref 1.4–6.5)
Neutrophil %: 23.8 %
Platelet: 241 x10 3/mm (ref 150–440)
RBC: 3.01 10*6/uL — AB (ref 3.80–5.20)
RDW: 16.8 % — ABNORMAL HIGH (ref 11.5–14.5)
WBC: 3.1 x10 3/mm — ABNORMAL LOW (ref 3.6–11.0)

## 2013-09-12 LAB — HEPATIC FUNCTION PANEL A (ARMC)
ALBUMIN: 2.4 g/dL — AB (ref 3.4–5.0)
ALK PHOS: 91 U/L
Bilirubin, Direct: <0.05 mg/dL (ref 0.00–0.20)
Bilirubin,Total: 0.2 mg/dL (ref 0.2–1.0)
SGOT(AST): 56 U/L — ABNORMAL HIGH (ref 15–37)
SGPT (ALT): 64 U/L — ABNORMAL HIGH
Total Protein: 6.3 g/dL — ABNORMAL LOW (ref 6.4–8.2)

## 2013-09-12 LAB — BASIC METABOLIC PANEL
Anion Gap: 10 (ref 7–16)
BUN: 48 mg/dL — ABNORMAL HIGH (ref 7–18)
CALCIUM: 7.9 mg/dL — AB (ref 8.5–10.1)
Chloride: 107 mmol/L (ref 98–107)
Co2: 23 mmol/L (ref 21–32)
Creatinine: 4.19 mg/dL — ABNORMAL HIGH (ref 0.60–1.30)
EGFR (Non-African Amer.): 11 — ABNORMAL LOW
GFR CALC AF AMER: 13 — AB
GLUCOSE: 151 mg/dL — AB (ref 65–99)
Osmolality: 295 (ref 275–301)
POTASSIUM: 5 mmol/L (ref 3.5–5.1)
Sodium: 140 mmol/L (ref 136–145)

## 2013-09-19 LAB — BASIC METABOLIC PANEL
Anion Gap: 10 (ref 7–16)
BUN: 59 mg/dL — ABNORMAL HIGH (ref 7–18)
Calcium, Total: 8.4 mg/dL — ABNORMAL LOW (ref 8.5–10.1)
Chloride: 107 mmol/L (ref 98–107)
Co2: 24 mmol/L (ref 21–32)
Creatinine: 3.96 mg/dL — ABNORMAL HIGH (ref 0.60–1.30)
EGFR (African American): 14 — ABNORMAL LOW
EGFR (Non-African Amer.): 12 — ABNORMAL LOW
Glucose: 157 mg/dL — ABNORMAL HIGH (ref 65–99)
OSMOLALITY: 301 (ref 275–301)
Potassium: 4.2 mmol/L (ref 3.5–5.1)
SODIUM: 141 mmol/L (ref 136–145)

## 2013-09-19 LAB — CBC CANCER CENTER
Basophil #: 0 x10 3/mm (ref 0.0–0.1)
Basophil %: 1.4 %
Eosinophil #: 0.2 x10 3/mm (ref 0.0–0.7)
Eosinophil %: 5.8 %
HCT: 31.6 % — AB (ref 35.0–47.0)
HGB: 10.2 g/dL — AB (ref 12.0–16.0)
Lymphocyte #: 1.4 x10 3/mm (ref 1.0–3.6)
Lymphocyte %: 42.8 %
MCH: 29.8 pg (ref 26.0–34.0)
MCHC: 32.3 g/dL (ref 32.0–36.0)
MCV: 92 fL (ref 80–100)
Monocyte #: 0.5 x10 3/mm (ref 0.2–0.9)
Monocyte %: 17 %
Neutrophil #: 1 x10 3/mm — ABNORMAL LOW (ref 1.4–6.5)
Neutrophil %: 33 %
Platelet: 219 x10 3/mm (ref 150–440)
RBC: 3.43 10*6/uL — ABNORMAL LOW (ref 3.80–5.20)
RDW: 16.8 % — ABNORMAL HIGH (ref 11.5–14.5)
WBC: 3.2 x10 3/mm — AB (ref 3.6–11.0)

## 2013-09-19 LAB — HEPATIC FUNCTION PANEL A (ARMC)
ALBUMIN: 2.7 g/dL — AB (ref 3.4–5.0)
Alkaline Phosphatase: 85 U/L
Bilirubin, Direct: <0.05 mg/dL (ref 0.00–0.20)
Bilirubin,Total: 0.2 mg/dL (ref 0.2–1.0)
SGOT(AST): 55 U/L — ABNORMAL HIGH (ref 15–37)
SGPT (ALT): 64 U/L — ABNORMAL HIGH
Total Protein: 7 g/dL (ref 6.4–8.2)

## 2013-09-25 ENCOUNTER — Ambulatory Visit: Payer: Self-pay | Admitting: Internal Medicine

## 2013-09-26 LAB — CBC CANCER CENTER
Basophil #: 0 x10 3/mm (ref 0.0–0.1)
Basophil %: 1.3 %
EOS ABS: 0.3 x10 3/mm (ref 0.0–0.7)
Eosinophil %: 11.3 %
HCT: 30.2 % — ABNORMAL LOW (ref 35.0–47.0)
HGB: 9.8 g/dL — ABNORMAL LOW (ref 12.0–16.0)
LYMPHS PCT: 41 %
Lymphocyte #: 1.2 x10 3/mm (ref 1.0–3.6)
MCH: 29.7 pg (ref 26.0–34.0)
MCHC: 32.4 g/dL (ref 32.0–36.0)
MCV: 92 fL (ref 80–100)
Monocyte #: 0.6 x10 3/mm (ref 0.2–0.9)
Monocyte %: 19.6 %
Neutrophil #: 0.8 x10 3/mm — ABNORMAL LOW (ref 1.4–6.5)
Neutrophil %: 26.8 %
Platelet: 163 x10 3/mm (ref 150–440)
RBC: 3.29 10*6/uL — AB (ref 3.80–5.20)
RDW: 16.1 % — ABNORMAL HIGH (ref 11.5–14.5)
WBC: 2.8 x10 3/mm — AB (ref 3.6–11.0)

## 2013-09-26 LAB — BASIC METABOLIC PANEL
ANION GAP: 12 (ref 7–16)
BUN: 55 mg/dL — AB (ref 7–18)
CO2: 21 mmol/L (ref 21–32)
CREATININE: 3.94 mg/dL — AB (ref 0.60–1.30)
Calcium, Total: 8.1 mg/dL — ABNORMAL LOW (ref 8.5–10.1)
Chloride: 109 mmol/L — ABNORMAL HIGH (ref 98–107)
EGFR (African American): 14 — ABNORMAL LOW
EGFR (Non-African Amer.): 12 — ABNORMAL LOW
Glucose: 105 mg/dL — ABNORMAL HIGH (ref 65–99)
Osmolality: 299 (ref 275–301)
POTASSIUM: 4.1 mmol/L (ref 3.5–5.1)
Sodium: 142 mmol/L (ref 136–145)

## 2013-10-03 LAB — CBC CANCER CENTER
Basophil #: 0 x10 3/mm (ref 0.0–0.1)
Basophil %: 1.1 %
Eosinophil #: 0.3 x10 3/mm (ref 0.0–0.7)
Eosinophil %: 10.1 %
HCT: 31.2 % — AB (ref 35.0–47.0)
HGB: 10.2 g/dL — ABNORMAL LOW (ref 12.0–16.0)
Lymphocyte #: 1.3 x10 3/mm (ref 1.0–3.6)
Lymphocyte %: 48.7 %
MCH: 29.8 pg (ref 26.0–34.0)
MCHC: 32.7 g/dL (ref 32.0–36.0)
MCV: 91 fL (ref 80–100)
Monocyte #: 0.3 x10 3/mm (ref 0.2–0.9)
Monocyte %: 12.8 %
NEUTROS PCT: 27.3 %
Neutrophil #: 0.7 x10 3/mm — ABNORMAL LOW (ref 1.4–6.5)
Platelet: 132 x10 3/mm — ABNORMAL LOW (ref 150–440)
RBC: 3.42 10*6/uL — AB (ref 3.80–5.20)
RDW: 14.8 % — ABNORMAL HIGH (ref 11.5–14.5)
WBC: 2.6 x10 3/mm — AB (ref 3.6–11.0)

## 2013-10-03 LAB — BASIC METABOLIC PANEL
Anion Gap: 12 (ref 7–16)
BUN: 43 mg/dL — ABNORMAL HIGH (ref 7–18)
CALCIUM: 7.8 mg/dL — AB (ref 8.5–10.1)
CHLORIDE: 108 mmol/L — AB (ref 98–107)
Co2: 22 mmol/L (ref 21–32)
Creatinine: 3.75 mg/dL — ABNORMAL HIGH (ref 0.60–1.30)
EGFR (African American): 15 — ABNORMAL LOW
EGFR (Non-African Amer.): 13 — ABNORMAL LOW
Glucose: 142 mg/dL — ABNORMAL HIGH (ref 65–99)
Osmolality: 296 (ref 275–301)
Potassium: 3.9 mmol/L (ref 3.5–5.1)
Sodium: 142 mmol/L (ref 136–145)

## 2013-10-08 ENCOUNTER — Ambulatory Visit: Payer: Self-pay | Admitting: Nephrology

## 2013-10-08 LAB — CBC CANCER CENTER
BASOS ABS: 0 x10 3/mm (ref 0.0–0.1)
BASOS PCT: 0.8 %
EOS ABS: 0.4 x10 3/mm (ref 0.0–0.7)
Eosinophil %: 7.8 %
HCT: 32.6 % — ABNORMAL LOW (ref 35.0–47.0)
HGB: 10.6 g/dL — ABNORMAL LOW (ref 12.0–16.0)
Lymphocyte #: 1.8 x10 3/mm (ref 1.0–3.6)
Lymphocyte %: 37.1 %
MCH: 29.9 pg (ref 26.0–34.0)
MCHC: 32.5 g/dL (ref 32.0–36.0)
MCV: 92 fL (ref 80–100)
MONOS PCT: 18.8 %
Monocyte #: 0.9 x10 3/mm (ref 0.2–0.9)
Neutrophil #: 1.7 x10 3/mm (ref 1.4–6.5)
Neutrophil %: 35.5 %
Platelet: 134 x10 3/mm — ABNORMAL LOW (ref 150–440)
RBC: 3.55 10*6/uL — ABNORMAL LOW (ref 3.80–5.20)
RDW: 14.6 % — AB (ref 11.5–14.5)
WBC: 4.8 x10 3/mm (ref 3.6–11.0)

## 2013-10-08 LAB — BASIC METABOLIC PANEL
ANION GAP: 10 (ref 7–16)
BUN: 54 mg/dL — AB (ref 7–18)
CHLORIDE: 107 mmol/L (ref 98–107)
CO2: 24 mmol/L (ref 21–32)
Calcium, Total: 8.7 mg/dL (ref 8.5–10.1)
Creatinine: 3.73 mg/dL — ABNORMAL HIGH (ref 0.60–1.30)
EGFR (Non-African Amer.): 13 — ABNORMAL LOW
GFR CALC AF AMER: 15 — AB
GLUCOSE: 148 mg/dL — AB (ref 65–99)
OSMOLALITY: 299 (ref 275–301)
Potassium: 3.4 mmol/L — ABNORMAL LOW (ref 3.5–5.1)
Sodium: 141 mmol/L (ref 136–145)

## 2013-10-08 LAB — PHOSPHORUS: Phosphorus: 5.1 mg/dL — ABNORMAL HIGH (ref 2.5–4.9)

## 2013-10-15 LAB — CBC CANCER CENTER
Basophil #: 0 x10 3/mm (ref 0.0–0.1)
Basophil %: 0.9 %
Eosinophil #: 0.3 x10 3/mm (ref 0.0–0.7)
Eosinophil %: 17 %
HCT: 30.8 % — ABNORMAL LOW (ref 35.0–47.0)
HGB: 10.1 g/dL — AB (ref 12.0–16.0)
LYMPHS ABS: 0.8 x10 3/mm — AB (ref 1.0–3.6)
Lymphocyte %: 48.7 %
MCH: 29.9 pg (ref 26.0–34.0)
MCHC: 32.6 g/dL (ref 32.0–36.0)
MCV: 92 fL (ref 80–100)
Monocyte #: 0.3 x10 3/mm (ref 0.2–0.9)
Monocyte %: 16.9 %
Neutrophil #: 0.3 x10 3/mm — ABNORMAL LOW (ref 1.4–6.5)
Neutrophil %: 16.5 %
Platelet: 57 x10 3/mm — ABNORMAL LOW (ref 150–440)
RBC: 3.36 10*6/uL — AB (ref 3.80–5.20)
RDW: 13.3 % (ref 11.5–14.5)
WBC: 1.6 x10 3/mm — CL (ref 3.6–11.0)

## 2013-10-15 LAB — CREATININE, SERUM
CREATININE: 3.92 mg/dL — AB (ref 0.60–1.30)
EGFR (Non-African Amer.): 12 — ABNORMAL LOW
GFR CALC AF AMER: 14 — AB

## 2013-10-22 LAB — CBC CANCER CENTER
Basophil #: 0 x10 3/mm (ref 0.0–0.1)
Basophil %: 0.9 %
EOS ABS: 0.1 x10 3/mm (ref 0.0–0.7)
EOS PCT: 1.5 %
HCT: 28.2 % — ABNORMAL LOW (ref 35.0–47.0)
HGB: 9.1 g/dL — AB (ref 12.0–16.0)
LYMPHS ABS: 0.9 x10 3/mm — AB (ref 1.0–3.6)
Lymphocyte %: 22.3 %
MCH: 30.1 pg (ref 26.0–34.0)
MCHC: 32.4 g/dL (ref 32.0–36.0)
MCV: 93 fL (ref 80–100)
MONO ABS: 0.9 x10 3/mm (ref 0.2–0.9)
Monocyte %: 24 %
Neutrophil #: 2 x10 3/mm (ref 1.4–6.5)
Neutrophil %: 51.3 %
PLATELETS: 146 x10 3/mm — AB (ref 150–440)
RBC: 3.03 10*6/uL — ABNORMAL LOW (ref 3.80–5.20)
RDW: 13.9 % (ref 11.5–14.5)
WBC: 3.9 x10 3/mm (ref 3.6–11.0)

## 2013-10-22 LAB — CREATININE, SERUM
Creatinine: 3.88 mg/dL — ABNORMAL HIGH (ref 0.60–1.30)
EGFR (African American): 15 — ABNORMAL LOW
EGFR (Non-African Amer.): 13 — ABNORMAL LOW

## 2013-10-25 ENCOUNTER — Ambulatory Visit: Payer: Self-pay | Admitting: Internal Medicine

## 2013-10-25 ENCOUNTER — Ambulatory Visit: Payer: Self-pay | Admitting: Nephrology

## 2013-10-29 ENCOUNTER — Ambulatory Visit: Payer: Self-pay | Admitting: Vascular Surgery

## 2013-10-29 LAB — HEPATIC FUNCTION PANEL A (ARMC)
AST: 134 U/L — AB (ref 15–37)
Albumin: 2.9 g/dL — ABNORMAL LOW (ref 3.4–5.0)
Alkaline Phosphatase: 116 U/L
BILIRUBIN TOTAL: 0.2 mg/dL (ref 0.2–1.0)
SGPT (ALT): 142 U/L — ABNORMAL HIGH
TOTAL PROTEIN: 7.2 g/dL (ref 6.4–8.2)

## 2013-10-29 LAB — CBC CANCER CENTER
Basophil #: 0.1 x10 3/mm (ref 0.0–0.1)
Basophil %: 1.2 %
Eosinophil #: 0.1 x10 3/mm (ref 0.0–0.7)
Eosinophil %: 1.4 %
HCT: 28.6 % — ABNORMAL LOW (ref 35.0–47.0)
HGB: 9.1 g/dL — ABNORMAL LOW (ref 12.0–16.0)
Lymphocyte #: 1.7 x10 3/mm (ref 1.0–3.6)
Lymphocyte %: 33.3 %
MCH: 29.6 pg (ref 26.0–34.0)
MCHC: 31.7 g/dL — ABNORMAL LOW (ref 32.0–36.0)
MCV: 94 fL (ref 80–100)
Monocyte #: 0.8 x10 3/mm (ref 0.2–0.9)
Monocyte %: 14.9 %
Neutrophil #: 2.5 x10 3/mm (ref 1.4–6.5)
Neutrophil %: 49.2 %
Platelet: 221 x10 3/mm (ref 150–440)
RBC: 3.06 10*6/uL — ABNORMAL LOW (ref 3.80–5.20)
RDW: 14.8 % — ABNORMAL HIGH (ref 11.5–14.5)
WBC: 5.1 x10 3/mm (ref 3.6–11.0)

## 2013-10-29 LAB — BASIC METABOLIC PANEL
Anion Gap: 5 — ABNORMAL LOW (ref 7–16)
BUN: 39 mg/dL — ABNORMAL HIGH (ref 7–18)
Calcium, Total: 8.1 mg/dL — ABNORMAL LOW (ref 8.5–10.1)
Chloride: 114 mmol/L — ABNORMAL HIGH (ref 98–107)
Co2: 21 mmol/L (ref 21–32)
Creatinine: 3.87 mg/dL — ABNORMAL HIGH (ref 0.60–1.30)
EGFR (African American): 15 — ABNORMAL LOW
EGFR (Non-African Amer.): 13 — ABNORMAL LOW
Glucose: 207 mg/dL — ABNORMAL HIGH (ref 65–99)
Osmolality: 295 (ref 275–301)
Potassium: 4.7 mmol/L (ref 3.5–5.1)
Sodium: 140 mmol/L (ref 136–145)

## 2013-11-05 ENCOUNTER — Ambulatory Visit: Payer: Self-pay | Admitting: Vascular Surgery

## 2013-11-05 LAB — COMPREHENSIVE METABOLIC PANEL
ALBUMIN: 3 g/dL — AB (ref 3.4–5.0)
ALK PHOS: 100 U/L
Anion Gap: 9 (ref 7–16)
BUN: 41 mg/dL — AB (ref 7–18)
Bilirubin,Total: 0.3 mg/dL (ref 0.2–1.0)
CALCIUM: 8.2 mg/dL — AB (ref 8.5–10.1)
CHLORIDE: 99 mmol/L (ref 98–107)
CO2: 24 mmol/L (ref 21–32)
Creatinine: 3.94 mg/dL — ABNORMAL HIGH (ref 0.60–1.30)
EGFR (African American): 15 — ABNORMAL LOW
EGFR (Non-African Amer.): 12 — ABNORMAL LOW
Glucose: 456 mg/dL — ABNORMAL HIGH (ref 65–99)
Osmolality: 294 (ref 275–301)
Potassium: 4.5 mmol/L (ref 3.5–5.1)
SGOT(AST): 79 U/L — ABNORMAL HIGH (ref 15–37)
SGPT (ALT): 120 U/L — ABNORMAL HIGH
SODIUM: 132 mmol/L — AB (ref 136–145)
TOTAL PROTEIN: 7 g/dL (ref 6.4–8.2)

## 2013-11-05 LAB — CBC CANCER CENTER
BASOS ABS: 0 x10 3/mm (ref 0.0–0.1)
Basophil %: 0.5 %
Eosinophil #: 0 x10 3/mm (ref 0.0–0.7)
Eosinophil %: 0.1 %
HCT: 32.2 % — ABNORMAL LOW (ref 35.0–47.0)
HGB: 10.4 g/dL — ABNORMAL LOW (ref 12.0–16.0)
LYMPHS ABS: 0.5 x10 3/mm — AB (ref 1.0–3.6)
LYMPHS PCT: 19.8 %
MCH: 29.9 pg (ref 26.0–34.0)
MCHC: 32.4 g/dL (ref 32.0–36.0)
MCV: 92 fL (ref 80–100)
Monocyte #: 0 x10 3/mm — ABNORMAL LOW (ref 0.2–0.9)
Monocyte %: 1.2 %
Neutrophil #: 2 x10 3/mm (ref 1.4–6.5)
Neutrophil %: 78.4 %
PLATELETS: 144 x10 3/mm — AB (ref 150–440)
RBC: 3.49 10*6/uL — ABNORMAL LOW (ref 3.80–5.20)
RDW: 14.5 % (ref 11.5–14.5)
WBC: 2.6 x10 3/mm — ABNORMAL LOW (ref 3.6–11.0)

## 2013-11-12 LAB — COMPREHENSIVE METABOLIC PANEL
ALK PHOS: 85 U/L
Albumin: 2.9 g/dL — ABNORMAL LOW (ref 3.4–5.0)
Anion Gap: 7 (ref 7–16)
BILIRUBIN TOTAL: 0.3 mg/dL (ref 0.2–1.0)
BUN: 24 mg/dL — ABNORMAL HIGH (ref 7–18)
CO2: 28 mmol/L (ref 21–32)
Calcium, Total: 8.9 mg/dL (ref 8.5–10.1)
Chloride: 101 mmol/L (ref 98–107)
Creatinine: 3.34 mg/dL — ABNORMAL HIGH (ref 0.60–1.30)
EGFR (African American): 18 — ABNORMAL LOW
GFR CALC NON AF AMER: 15 — AB
Glucose: 235 mg/dL — ABNORMAL HIGH (ref 65–99)
OSMOLALITY: 284 (ref 275–301)
POTASSIUM: 4.1 mmol/L (ref 3.5–5.1)
SGOT(AST): 60 U/L — ABNORMAL HIGH (ref 15–37)
SGPT (ALT): 106 U/L — ABNORMAL HIGH
SODIUM: 136 mmol/L (ref 136–145)
TOTAL PROTEIN: 7 g/dL (ref 6.4–8.2)

## 2013-11-12 LAB — CBC CANCER CENTER
Basophil #: 0 x10 3/mm (ref 0.0–0.1)
Basophil %: 0.9 %
EOS ABS: 0.1 x10 3/mm (ref 0.0–0.7)
EOS PCT: 3.7 %
HCT: 29.5 % — ABNORMAL LOW (ref 35.0–47.0)
HGB: 9.5 g/dL — ABNORMAL LOW (ref 12.0–16.0)
LYMPHS ABS: 0.7 x10 3/mm — AB (ref 1.0–3.6)
LYMPHS PCT: 19.2 %
MCH: 29.7 pg (ref 26.0–34.0)
MCHC: 32.3 g/dL (ref 32.0–36.0)
MCV: 92 fL (ref 80–100)
Monocyte #: 0.1 x10 3/mm — ABNORMAL LOW (ref 0.2–0.9)
Monocyte %: 1.5 %
NEUTROS PCT: 74.7 %
Neutrophil #: 2.8 x10 3/mm (ref 1.4–6.5)
Platelet: 103 x10 3/mm — ABNORMAL LOW (ref 150–440)
RBC: 3.21 10*6/uL — ABNORMAL LOW (ref 3.80–5.20)
RDW: 13.9 % (ref 11.5–14.5)
WBC: 3.7 x10 3/mm (ref 3.6–11.0)

## 2013-11-17 ENCOUNTER — Inpatient Hospital Stay: Payer: Self-pay | Admitting: Internal Medicine

## 2013-11-17 LAB — CBC WITH DIFFERENTIAL/PLATELET
Basophil #: 0 10*3/uL (ref 0.0–0.1)
Basophil %: 1.2 %
Eosinophil #: 0.1 10*3/uL (ref 0.0–0.7)
Eosinophil %: 10 %
HCT: 30.7 % — AB (ref 35.0–47.0)
HGB: 9.8 g/dL — ABNORMAL LOW (ref 12.0–16.0)
Lymphocyte #: 1 10*3/uL (ref 1.0–3.6)
Lymphocyte %: 76.2 %
MCH: 29.3 pg (ref 26.0–34.0)
MCHC: 32 g/dL (ref 32.0–36.0)
MCV: 92 fL (ref 80–100)
MONOS PCT: 4 %
Monocyte #: 0.1 x10 3/mm — ABNORMAL LOW (ref 0.2–0.9)
Neutrophil #: 0.1 10*3/uL — ABNORMAL LOW (ref 1.4–6.5)
Neutrophil %: 8.6 %
Platelet: 87 10*3/uL — ABNORMAL LOW (ref 150–440)
RBC: 3.35 10*6/uL — ABNORMAL LOW (ref 3.80–5.20)
RDW: 13.7 % (ref 11.5–14.5)
WBC: 1.3 10*3/uL — CL (ref 3.6–11.0)

## 2013-11-17 LAB — COMPREHENSIVE METABOLIC PANEL
ALK PHOS: 70 U/L
ANION GAP: 9 (ref 7–16)
AST: 45 U/L — AB (ref 15–37)
Albumin: 2.9 g/dL — ABNORMAL LOW (ref 3.4–5.0)
BILIRUBIN TOTAL: 0.4 mg/dL (ref 0.2–1.0)
BUN: 31 mg/dL — ABNORMAL HIGH (ref 7–18)
CO2: 28 mmol/L (ref 21–32)
Calcium, Total: 7.8 mg/dL — ABNORMAL LOW (ref 8.5–10.1)
Chloride: 103 mmol/L (ref 98–107)
Creatinine: 3.09 mg/dL — ABNORMAL HIGH (ref 0.60–1.30)
GFR CALC AF AMER: 20 — AB
GFR CALC NON AF AMER: 16 — AB
Glucose: 227 mg/dL — ABNORMAL HIGH (ref 65–99)
Osmolality: 293 (ref 275–301)
POTASSIUM: 3.7 mmol/L (ref 3.5–5.1)
SGPT (ALT): 74 U/L — ABNORMAL HIGH
Sodium: 140 mmol/L (ref 136–145)
Total Protein: 6.6 g/dL (ref 6.4–8.2)

## 2013-11-17 LAB — URINALYSIS, COMPLETE
BACTERIA: NONE SEEN
BLOOD: NEGATIVE
Bilirubin,UR: NEGATIVE
Glucose,UR: 50 mg/dL (ref 0–75)
Ketone: NEGATIVE
Leukocyte Esterase: NEGATIVE
Nitrite: NEGATIVE
PH: 7 (ref 4.5–8.0)
RBC,UR: NONE SEEN /HPF (ref 0–5)
Specific Gravity: 1.011 (ref 1.003–1.030)
Squamous Epithelial: 1

## 2013-11-17 LAB — TROPONIN I: Troponin-I: 0.03 ng/mL

## 2013-11-17 LAB — LIPASE, BLOOD: Lipase: 150 U/L (ref 73–393)

## 2013-11-18 LAB — BASIC METABOLIC PANEL
ANION GAP: 8 (ref 7–16)
BUN: 31 mg/dL — AB (ref 7–18)
CALCIUM: 7.7 mg/dL — AB (ref 8.5–10.1)
Chloride: 109 mmol/L — ABNORMAL HIGH (ref 98–107)
Co2: 25 mmol/L (ref 21–32)
Creatinine: 2.82 mg/dL — ABNORMAL HIGH (ref 0.60–1.30)
EGFR (African American): 22 — ABNORMAL LOW
EGFR (Non-African Amer.): 18 — ABNORMAL LOW
GLUCOSE: 165 mg/dL — AB (ref 65–99)
Osmolality: 293 (ref 275–301)
Potassium: 3.8 mmol/L (ref 3.5–5.1)
SODIUM: 142 mmol/L (ref 136–145)

## 2013-11-18 LAB — CBC WITH DIFFERENTIAL/PLATELET
Basophil: 4 %
Eosinophil: 18 %
HCT: 27 % — ABNORMAL LOW (ref 35.0–47.0)
HGB: 8.5 g/dL — ABNORMAL LOW (ref 12.0–16.0)
Lymphocytes: 64 %
MCH: 29.2 pg (ref 26.0–34.0)
MCHC: 31.3 g/dL — ABNORMAL LOW (ref 32.0–36.0)
MCV: 93 fL (ref 80–100)
MONOS PCT: 4 %
Platelet: 80 10*3/uL — ABNORMAL LOW (ref 150–440)
RBC: 2.9 10*6/uL — ABNORMAL LOW (ref 3.80–5.20)
RDW: 13.7 % (ref 11.5–14.5)
SEGMENTED NEUTROPHILS: 8 %
VARIANT LYMPHOCYTE - H1-RLYMPH: 2 %
WBC: 1.1 10*3/uL — CL (ref 3.6–11.0)

## 2013-11-19 LAB — CBC WITH DIFFERENTIAL/PLATELET
BASOS PCT: 1.4 %
Basophil #: 0 10*3/uL (ref 0.0–0.1)
EOS ABS: 0.1 10*3/uL (ref 0.0–0.7)
Eosinophil %: 9 %
HCT: 25.3 % — ABNORMAL LOW (ref 35.0–47.0)
HGB: 8.3 g/dL — ABNORMAL LOW (ref 12.0–16.0)
Lymphocyte #: 0.9 10*3/uL — ABNORMAL LOW (ref 1.0–3.6)
Lymphocyte %: 66.2 %
MCH: 30.3 pg (ref 26.0–34.0)
MCHC: 32.7 g/dL (ref 32.0–36.0)
MCV: 93 fL (ref 80–100)
Monocyte #: 0.1 x10 3/mm — ABNORMAL LOW (ref 0.2–0.9)
Monocyte %: 10.9 %
Neutrophil #: 0.2 10*3/uL — ABNORMAL LOW (ref 1.4–6.5)
Neutrophil %: 12.5 %
Platelet: 100 10*3/uL — ABNORMAL LOW (ref 150–440)
RBC: 2.73 10*6/uL — AB (ref 3.80–5.20)
RDW: 13.4 % (ref 11.5–14.5)
WBC: 1.4 10*3/uL — AB (ref 3.6–11.0)

## 2013-11-19 LAB — BASIC METABOLIC PANEL
ANION GAP: 7 (ref 7–16)
BUN: 26 mg/dL — ABNORMAL HIGH (ref 7–18)
CALCIUM: 7.8 mg/dL — AB (ref 8.5–10.1)
CHLORIDE: 111 mmol/L — AB (ref 98–107)
Co2: 21 mmol/L (ref 21–32)
Creatinine: 3.04 mg/dL — ABNORMAL HIGH (ref 0.60–1.30)
EGFR (Non-African Amer.): 17 — ABNORMAL LOW
GFR CALC AF AMER: 20 — AB
Glucose: 150 mg/dL — ABNORMAL HIGH (ref 65–99)
OSMOLALITY: 285 (ref 275–301)
Potassium: 4.3 mmol/L (ref 3.5–5.1)
Sodium: 139 mmol/L (ref 136–145)

## 2013-11-19 LAB — URINE CULTURE

## 2013-11-20 LAB — RENAL FUNCTION PANEL
ALBUMIN: 2.3 g/dL — AB (ref 3.4–5.0)
Anion Gap: 7 (ref 7–16)
BUN: 24 mg/dL — ABNORMAL HIGH (ref 7–18)
CALCIUM: 7.7 mg/dL — AB (ref 8.5–10.1)
CHLORIDE: 116 mmol/L — AB (ref 98–107)
CREATININE: 3.16 mg/dL — AB (ref 0.60–1.30)
Co2: 22 mmol/L (ref 21–32)
EGFR (African American): 19 — ABNORMAL LOW
EGFR (Non-African Amer.): 16 — ABNORMAL LOW
GLUCOSE: 139 mg/dL — AB (ref 65–99)
Osmolality: 295 (ref 275–301)
POTASSIUM: 4.5 mmol/L (ref 3.5–5.1)
Phosphorus: 3 mg/dL (ref 2.5–4.9)
Sodium: 145 mmol/L (ref 136–145)

## 2013-11-20 LAB — CBC WITH DIFFERENTIAL/PLATELET
BASOS ABS: 0 10*3/uL (ref 0.0–0.1)
Basophil %: 0.6 %
Eosinophil #: 0.2 10*3/uL (ref 0.0–0.7)
Eosinophil %: 5.4 %
HCT: 26.1 % — AB (ref 35.0–47.0)
HGB: 8.5 g/dL — AB (ref 12.0–16.0)
LYMPHS PCT: 36.3 %
Lymphocyte #: 1.1 10*3/uL (ref 1.0–3.6)
MCH: 30 pg (ref 26.0–34.0)
MCHC: 32.8 g/dL (ref 32.0–36.0)
MCV: 92 fL (ref 80–100)
Monocyte #: 0.5 x10 3/mm (ref 0.2–0.9)
Monocyte %: 17.1 %
Neutrophil #: 1.3 10*3/uL — ABNORMAL LOW (ref 1.4–6.5)
Neutrophil %: 40.6 %
Platelet: 115 10*3/uL — ABNORMAL LOW (ref 150–440)
RBC: 2.84 10*6/uL — ABNORMAL LOW (ref 3.80–5.20)
RDW: 13.9 % (ref 11.5–14.5)
WBC: 3.2 10*3/uL — ABNORMAL LOW (ref 3.6–11.0)

## 2013-11-21 LAB — CBC WITH DIFFERENTIAL/PLATELET
Basophil #: 0 10*3/uL (ref 0.0–0.1)
Basophil %: 0.4 %
Eosinophil #: 0.2 10*3/uL (ref 0.0–0.7)
Eosinophil %: 2.7 %
HCT: 26.1 % — AB (ref 35.0–47.0)
HGB: 8.6 g/dL — ABNORMAL LOW (ref 12.0–16.0)
LYMPHS PCT: 31.2 %
Lymphocyte #: 1.9 10*3/uL (ref 1.0–3.6)
MCH: 30 pg (ref 26.0–34.0)
MCHC: 32.8 g/dL (ref 32.0–36.0)
MCV: 91 fL (ref 80–100)
MONOS PCT: 20 %
Monocyte #: 1.2 x10 3/mm — ABNORMAL HIGH (ref 0.2–0.9)
Neutrophil #: 2.8 10*3/uL (ref 1.4–6.5)
Neutrophil %: 45.7 %
Platelet: 131 10*3/uL — ABNORMAL LOW (ref 150–440)
RBC: 2.85 10*6/uL — AB (ref 3.80–5.20)
RDW: 13.9 % (ref 11.5–14.5)
WBC: 6.1 10*3/uL (ref 3.6–11.0)

## 2013-11-22 LAB — CULTURE, BLOOD (SINGLE)

## 2013-11-25 ENCOUNTER — Ambulatory Visit: Payer: Self-pay | Admitting: Internal Medicine

## 2013-11-26 ENCOUNTER — Inpatient Hospital Stay: Payer: Self-pay | Admitting: Internal Medicine

## 2013-11-26 LAB — URINALYSIS, COMPLETE
BILIRUBIN, UR: NEGATIVE
Bacteria: NONE SEEN
Blood: NEGATIVE
Glucose,UR: 50 mg/dL (ref 0–75)
Ketone: NEGATIVE
NITRITE: NEGATIVE
Ph: 7 (ref 4.5–8.0)
RBC,UR: 14 /HPF (ref 0–5)
Specific Gravity: 1.013 (ref 1.003–1.030)
Squamous Epithelial: 1
WBC UR: 93 /HPF (ref 0–5)

## 2013-11-26 LAB — COMPREHENSIVE METABOLIC PANEL
ALBUMIN: 2.3 g/dL — AB (ref 3.4–5.0)
ALK PHOS: 101 U/L
Anion Gap: 9 (ref 7–16)
BILIRUBIN TOTAL: 0.4 mg/dL (ref 0.2–1.0)
BUN: 17 mg/dL (ref 7–18)
CHLORIDE: 105 mmol/L (ref 98–107)
CO2: 26 mmol/L (ref 21–32)
Calcium, Total: 7.7 mg/dL — ABNORMAL LOW (ref 8.5–10.1)
Creatinine: 3.41 mg/dL — ABNORMAL HIGH (ref 0.60–1.30)
EGFR (Non-African Amer.): 15 — ABNORMAL LOW
GFR CALC AF AMER: 18 — AB
GLUCOSE: 150 mg/dL — AB (ref 65–99)
Osmolality: 284 (ref 275–301)
Potassium: 3.3 mmol/L — ABNORMAL LOW (ref 3.5–5.1)
SGOT(AST): 30 U/L (ref 15–37)
SGPT (ALT): 30 U/L
Sodium: 140 mmol/L (ref 136–145)
Total Protein: 6.2 g/dL — ABNORMAL LOW (ref 6.4–8.2)

## 2013-11-26 LAB — PROTIME-INR
INR: 1.1
Prothrombin Time: 13.7 secs (ref 11.5–14.7)

## 2013-11-26 LAB — CK-MB: CK-MB: 0.6 ng/mL (ref 0.5–3.6)

## 2013-11-26 LAB — APTT: ACTIVATED PTT: 32.2 s (ref 23.6–35.9)

## 2013-11-26 LAB — CBC WITH DIFFERENTIAL/PLATELET
BASOS PCT: 0.4 %
Basophil #: 0 10*3/uL (ref 0.0–0.1)
Eosinophil #: 0 10*3/uL (ref 0.0–0.7)
Eosinophil %: 0.5 %
HCT: 26.6 % — AB (ref 35.0–47.0)
HGB: 8.6 g/dL — ABNORMAL LOW (ref 12.0–16.0)
LYMPHS PCT: 15.4 %
Lymphocyte #: 1.3 10*3/uL (ref 1.0–3.6)
MCH: 29.7 pg (ref 26.0–34.0)
MCHC: 32.2 g/dL (ref 32.0–36.0)
MCV: 92 fL (ref 80–100)
MONOS PCT: 19.1 %
Monocyte #: 1.6 x10 3/mm — ABNORMAL HIGH (ref 0.2–0.9)
NEUTROS PCT: 64.6 %
Neutrophil #: 5.5 10*3/uL (ref 1.4–6.5)
Platelet: 129 10*3/uL — ABNORMAL LOW (ref 150–440)
RBC: 2.88 10*6/uL — AB (ref 3.80–5.20)
RDW: 14.7 % — ABNORMAL HIGH (ref 11.5–14.5)
WBC: 8.5 10*3/uL (ref 3.6–11.0)

## 2013-11-26 LAB — TROPONIN I
TROPONIN-I: 0.09 ng/mL — AB
Troponin-I: 0.08 ng/mL — ABNORMAL HIGH
Troponin-I: 0.08 ng/mL — ABNORMAL HIGH

## 2013-11-26 LAB — MAGNESIUM: Magnesium: 1.6 mg/dL — ABNORMAL LOW

## 2013-11-27 DIAGNOSIS — N186 End stage renal disease: Secondary | ICD-10-CM

## 2013-11-27 DIAGNOSIS — I10 Essential (primary) hypertension: Secondary | ICD-10-CM

## 2013-11-27 DIAGNOSIS — R7989 Other specified abnormal findings of blood chemistry: Secondary | ICD-10-CM

## 2013-11-27 LAB — CBC WITH DIFFERENTIAL/PLATELET
BASOS ABS: 0 10*3/uL (ref 0.0–0.1)
Basophil #: 0.1 10*3/uL (ref 0.0–0.1)
Basophil %: 0.4 %
Basophil %: 1.4 %
EOS PCT: 2 %
Eosinophil #: 0 10*3/uL (ref 0.0–0.7)
Eosinophil #: 0.1 10*3/uL (ref 0.0–0.7)
Eosinophil %: 0.4 %
HCT: 25.2 % — AB (ref 35.0–47.0)
HCT: 24.5 % — ABNORMAL LOW (ref 35.0–47.0)
HGB: 8.3 g/dL — ABNORMAL LOW (ref 12.0–16.0)
HGB: 8 g/dL — AB (ref 12.0–16.0)
LYMPHS PCT: 15.4 %
Lymphocyte #: 1.2 10*3/uL (ref 1.0–3.6)
Lymphocyte #: 1.5 10*3/uL (ref 1.0–3.6)
Lymphocyte %: 22.5 %
MCH: 30.1 pg (ref 26.0–34.0)
MCH: 30.1 pg (ref 26.0–34.0)
MCHC: 32.8 g/dL (ref 32.0–36.0)
MCHC: 32.6 g/dL (ref 32.0–36.0)
MCV: 92 fL (ref 80–100)
MCV: 93 fL (ref 80–100)
MONO ABS: 1 x10 3/mm — AB (ref 0.2–0.9)
MONOS PCT: 16.2 %
Monocyte #: 1 x10 3/mm — ABNORMAL HIGH (ref 0.2–0.9)
Monocyte %: 12.8 %
Neutrophil #: 5.4 10*3/uL (ref 1.4–6.5)
Neutrophil #: 3.8 10*3/uL (ref 1.4–6.5)
Neutrophil %: 71 %
Neutrophil %: 57.9 %
PLATELETS: 142 10*3/uL — AB (ref 150–440)
Platelet: 130 10*3/uL — ABNORMAL LOW (ref 150–440)
RBC: 2.75 10*6/uL — ABNORMAL LOW (ref 3.80–5.20)
RBC: 2.65 10*6/uL — AB (ref 3.80–5.20)
RDW: 14.4 % (ref 11.5–14.5)
RDW: 15 % — ABNORMAL HIGH (ref 11.5–14.5)
WBC: 7.6 10*3/uL (ref 3.6–11.0)
WBC: 6.5 10*3/uL (ref 3.6–11.0)

## 2013-11-27 LAB — DRUG SCREEN, URINE

## 2013-11-27 LAB — COMPREHENSIVE METABOLIC PANEL
ALBUMIN: 2.1 g/dL — AB (ref 3.4–5.0)
ALK PHOS: 95 U/L
ANION GAP: 8 (ref 7–16)
AST: 25 U/L (ref 15–37)
BUN: 23 mg/dL — ABNORMAL HIGH (ref 7–18)
Bilirubin,Total: 0.4 mg/dL (ref 0.2–1.0)
CHLORIDE: 105 mmol/L (ref 98–107)
Calcium, Total: 7.5 mg/dL — ABNORMAL LOW (ref 8.5–10.1)
Co2: 26 mmol/L (ref 21–32)
Creatinine: 3.45 mg/dL — ABNORMAL HIGH (ref 0.60–1.30)
EGFR (Non-African Amer.): 14 — ABNORMAL LOW
GFR CALC AF AMER: 18 — AB
Glucose: 177 mg/dL — ABNORMAL HIGH (ref 65–99)
Osmolality: 286 (ref 275–301)
Potassium: 3.6 mmol/L (ref 3.5–5.1)
SGPT (ALT): 28 U/L
Sodium: 139 mmol/L (ref 136–145)
Total Protein: 6.1 g/dL — ABNORMAL LOW (ref 6.4–8.2)

## 2013-11-27 LAB — CK-MB: CK-MB: 0.6 ng/mL (ref 0.5–3.6)

## 2013-11-27 LAB — RENAL FUNCTION PANEL
ANION GAP: 6 — AB (ref 7–16)
Albumin: 2.1 g/dL — ABNORMAL LOW (ref 3.4–5.0)
BUN: 27 mg/dL — ABNORMAL HIGH (ref 7–18)
CREATININE: 3.47 mg/dL — AB (ref 0.60–1.30)
Calcium, Total: 7.8 mg/dL — ABNORMAL LOW (ref 8.5–10.1)
Chloride: 105 mmol/L (ref 98–107)
Co2: 27 mmol/L (ref 21–32)
Glucose: 159 mg/dL — ABNORMAL HIGH (ref 65–99)
OSMOLALITY: 284 (ref 275–301)
POTASSIUM: 3.5 mmol/L (ref 3.5–5.1)
Phosphorus: 4.9 mg/dL (ref 2.5–4.9)
Sodium: 138 mmol/L (ref 136–145)

## 2013-11-27 LAB — HEPARIN LEVEL (UNFRACTIONATED): ANTI-XA(UNFRACTIONATED): 0.57 [IU]/mL (ref 0.30–0.70)

## 2013-11-27 LAB — WOUND CULTURE

## 2013-11-28 LAB — CBC WITH DIFFERENTIAL/PLATELET
Basophil #: 0 10*3/uL (ref 0.0–0.1)
Basophil %: 0.9 %
Eosinophil #: 0.2 10*3/uL (ref 0.0–0.7)
Eosinophil %: 3.2 %
HCT: 24.1 % — AB (ref 35.0–47.0)
HGB: 7.9 g/dL — ABNORMAL LOW (ref 12.0–16.0)
Lymphocyte #: 1.3 10*3/uL (ref 1.0–3.6)
Lymphocyte %: 23.8 %
MCH: 29.9 pg (ref 26.0–34.0)
MCHC: 32.6 g/dL (ref 32.0–36.0)
MCV: 92 fL (ref 80–100)
MONOS PCT: 17.7 %
Monocyte #: 0.9 x10 3/mm (ref 0.2–0.9)
Neutrophil #: 2.9 10*3/uL (ref 1.4–6.5)
Neutrophil %: 54.4 %
Platelet: 147 10*3/uL — ABNORMAL LOW (ref 150–440)
RBC: 2.63 10*6/uL — ABNORMAL LOW (ref 3.80–5.20)
RDW: 14.8 % — ABNORMAL HIGH (ref 11.5–14.5)
WBC: 5.3 10*3/uL (ref 3.6–11.0)

## 2013-11-28 LAB — PROTIME-INR
INR: 1
PROTHROMBIN TIME: 13.5 s (ref 11.5–14.7)

## 2013-11-28 LAB — HEPARIN LEVEL (UNFRACTIONATED)
Anti-Xa(Unfractionated): 1.04 IU/mL — ABNORMAL HIGH (ref 0.30–0.70)
Anti-Xa(Unfractionated): 0.78 IU/mL — ABNORMAL HIGH (ref 0.30–0.70)

## 2013-11-29 LAB — RENAL FUNCTION PANEL
ANION GAP: 9 (ref 7–16)
Albumin: 2.1 g/dL — ABNORMAL LOW (ref 3.4–5.0)
BUN: 18 mg/dL (ref 7–18)
CALCIUM: 8.2 mg/dL — AB (ref 8.5–10.1)
CHLORIDE: 102 mmol/L (ref 98–107)
Co2: 30 mmol/L (ref 21–32)
Creatinine: 3.33 mg/dL — ABNORMAL HIGH (ref 0.60–1.30)
EGFR (African American): 18 — ABNORMAL LOW
GFR CALC NON AF AMER: 15 — AB
GLUCOSE: 131 mg/dL — AB (ref 65–99)
Osmolality: 285 (ref 275–301)
PHOSPHORUS: 4.1 mg/dL (ref 2.5–4.9)
POTASSIUM: 3.8 mmol/L (ref 3.5–5.1)
Sodium: 141 mmol/L (ref 136–145)

## 2013-11-29 LAB — CBC WITH DIFFERENTIAL/PLATELET
BASOS ABS: 0 10*3/uL (ref 0.0–0.1)
BASOS ABS: 0 10*3/uL (ref 0.0–0.1)
Basophil %: 0.6 %
Basophil %: 0.7 %
EOS ABS: 0.1 10*3/uL (ref 0.0–0.7)
EOS ABS: 0.1 10*3/uL (ref 0.0–0.7)
EOS PCT: 2.6 %
EOS PCT: 2.5 %
HCT: 23.9 % — ABNORMAL LOW (ref 35.0–47.0)
HCT: 23.9 % — ABNORMAL LOW (ref 35.0–47.0)
HGB: 7.7 g/dL — ABNORMAL LOW (ref 12.0–16.0)
HGB: 7.7 g/dL — AB (ref 12.0–16.0)
LYMPHS PCT: 24.8 %
LYMPHS PCT: 21.1 %
Lymphocyte #: 1.1 10*3/uL (ref 1.0–3.6)
Lymphocyte #: 1 10*3/uL (ref 1.0–3.6)
MCH: 29.6 pg (ref 26.0–34.0)
MCH: 29.8 pg (ref 26.0–34.0)
MCHC: 32.4 g/dL (ref 32.0–36.0)
MCHC: 32.3 g/dL (ref 32.0–36.0)
MCV: 92 fL (ref 80–100)
MCV: 92 fL (ref 80–100)
MONO ABS: 0.7 x10 3/mm (ref 0.2–0.9)
Monocyte #: 0.6 x10 3/mm (ref 0.2–0.9)
Monocyte %: 14.4 %
Monocyte %: 12 %
NEUTROS ABS: 2.6 10*3/uL (ref 1.4–6.5)
NEUTROS PCT: 63.7 %
Neutrophil #: 3 10*3/uL (ref 1.4–6.5)
Neutrophil %: 57.6 %
PLATELETS: 153 10*3/uL (ref 150–440)
Platelet: 165 10*3/uL (ref 150–440)
RBC: 2.62 10*6/uL — AB (ref 3.80–5.20)
RBC: 2.6 10*6/uL — AB (ref 3.80–5.20)
RDW: 14.5 % (ref 11.5–14.5)
RDW: 15.2 % — ABNORMAL HIGH (ref 11.5–14.5)
WBC: 4.6 10*3/uL (ref 3.6–11.0)
WBC: 4.8 10*3/uL (ref 3.6–11.0)

## 2013-11-29 LAB — HEPARIN LEVEL (UNFRACTIONATED)
ANTI-XA(UNFRACTIONATED): 0.31 [IU]/mL (ref 0.30–0.70)
Anti-Xa(Unfractionated): 0.32 IU/mL (ref 0.30–0.70)

## 2013-11-29 LAB — IRON AND TIBC
Iron Bind.Cap.(Total): 148 ug/dL — ABNORMAL LOW (ref 250–450)
Iron Saturation: 19 %
Iron: 28 ug/dL — ABNORMAL LOW (ref 50–170)
Unbound Iron-Bind.Cap.: 120 ug/dL

## 2013-11-29 LAB — PROTIME-INR
INR: 1.2
PROTHROMBIN TIME: 15.1 s — AB (ref 11.5–14.7)

## 2013-11-30 LAB — CBC WITH DIFFERENTIAL/PLATELET
Basophil #: 0 10*3/uL (ref 0.0–0.1)
Basophil %: 0.9 %
EOS ABS: 0.1 10*3/uL (ref 0.0–0.7)
EOS PCT: 1.4 %
HCT: 22.3 % — ABNORMAL LOW (ref 35.0–47.0)
HGB: 7.1 g/dL — AB (ref 12.0–16.0)
Lymphocyte #: 1.3 10*3/uL (ref 1.0–3.6)
Lymphocyte %: 24.7 %
MCH: 29.5 pg (ref 26.0–34.0)
MCHC: 32 g/dL (ref 32.0–36.0)
MCV: 92 fL (ref 80–100)
MONOS PCT: 14.2 %
Monocyte #: 0.7 x10 3/mm (ref 0.2–0.9)
Neutrophil #: 3 10*3/uL (ref 1.4–6.5)
Neutrophil %: 58.8 %
Platelet: 165 10*3/uL (ref 150–440)
RBC: 2.42 10*6/uL — AB (ref 3.80–5.20)
RDW: 14.5 % (ref 11.5–14.5)
WBC: 5.1 10*3/uL (ref 3.6–11.0)

## 2013-11-30 LAB — PROTIME-INR
INR: 2.5
PROTHROMBIN TIME: 26.7 s — AB (ref 11.5–14.7)

## 2013-11-30 LAB — HEPARIN LEVEL (UNFRACTIONATED): Anti-Xa(Unfractionated): 0.3 IU/mL (ref 0.30–0.70)

## 2013-12-01 LAB — CBC WITH DIFFERENTIAL/PLATELET
BASOS PCT: 0.7 %
Basophil #: 0 10*3/uL (ref 0.0–0.1)
Eosinophil #: 0.2 10*3/uL (ref 0.0–0.7)
Eosinophil %: 3.8 %
HCT: 20.8 % — ABNORMAL LOW (ref 35.0–47.0)
HGB: 6.8 g/dL — AB (ref 12.0–16.0)
Lymphocyte #: 1.4 10*3/uL (ref 1.0–3.6)
Lymphocyte %: 27.9 %
MCH: 29.9 pg (ref 26.0–34.0)
MCHC: 32.5 g/dL (ref 32.0–36.0)
MCV: 92 fL (ref 80–100)
MONOS PCT: 14.2 %
Monocyte #: 0.7 x10 3/mm (ref 0.2–0.9)
NEUTROS PCT: 53.4 %
Neutrophil #: 2.7 10*3/uL (ref 1.4–6.5)
PLATELETS: 168 10*3/uL (ref 150–440)
RBC: 2.26 10*6/uL — AB (ref 3.80–5.20)
RDW: 14.5 % (ref 11.5–14.5)
WBC: 5.1 10*3/uL (ref 3.6–11.0)

## 2013-12-01 LAB — RENAL FUNCTION PANEL
Albumin: 1.9 g/dL — ABNORMAL LOW (ref 3.4–5.0)
Anion Gap: 5 — ABNORMAL LOW (ref 7–16)
BUN: 20 mg/dL — ABNORMAL HIGH (ref 7–18)
CO2: 31 mmol/L (ref 21–32)
CREATININE: 3.64 mg/dL — AB (ref 0.60–1.30)
Calcium, Total: 7.5 mg/dL — ABNORMAL LOW (ref 8.5–10.1)
Chloride: 102 mmol/L (ref 98–107)
EGFR (African American): 16 — ABNORMAL LOW
EGFR (Non-African Amer.): 14 — ABNORMAL LOW
Glucose: 181 mg/dL — ABNORMAL HIGH (ref 65–99)
Osmolality: 283 (ref 275–301)
Phosphorus: 3.2 mg/dL (ref 2.5–4.9)
Potassium: 3.6 mmol/L (ref 3.5–5.1)
SODIUM: 138 mmol/L (ref 136–145)

## 2013-12-01 LAB — VANCOMYCIN, TROUGH: Vancomycin, Trough: 12 ug/mL (ref 10–20)

## 2013-12-01 LAB — CULTURE, BLOOD (SINGLE)

## 2013-12-03 LAB — CBC WITH DIFFERENTIAL/PLATELET
Basophil #: 0 10*3/uL (ref 0.0–0.1)
Basophil %: 1 %
Eosinophil #: 0.2 10*3/uL (ref 0.0–0.7)
Eosinophil %: 3.7 %
HCT: 26.2 % — ABNORMAL LOW (ref 35.0–47.0)
HGB: 8.6 g/dL — AB (ref 12.0–16.0)
LYMPHS ABS: 1.6 10*3/uL (ref 1.0–3.6)
LYMPHS PCT: 31.2 %
MCH: 29.5 pg (ref 26.0–34.0)
MCHC: 32.9 g/dL (ref 32.0–36.0)
MCV: 90 fL (ref 80–100)
MONOS PCT: 16.8 %
Monocyte #: 0.9 x10 3/mm (ref 0.2–0.9)
Neutrophil #: 2.4 10*3/uL (ref 1.4–6.5)
Neutrophil %: 47.3 %
Platelet: 200 10*3/uL (ref 150–440)
RBC: 2.93 10*6/uL — ABNORMAL LOW (ref 3.80–5.20)
RDW: 14.9 % — ABNORMAL HIGH (ref 11.5–14.5)
WBC: 5.1 10*3/uL (ref 3.6–11.0)

## 2013-12-03 LAB — CULTURE, BLOOD (SINGLE)

## 2013-12-04 LAB — PHOSPHORUS: Phosphorus: 4 mg/dL (ref 2.5–4.9)

## 2013-12-05 LAB — CREATININE, SERUM
Creatinine: 2.52 mg/dL — ABNORMAL HIGH (ref 0.60–1.30)
EGFR (African American): 25 — ABNORMAL LOW
EGFR (Non-African Amer.): 21 — ABNORMAL LOW

## 2013-12-05 LAB — HEMOGLOBIN: HGB: 8.9 g/dL — AB (ref 12.0–16.0)

## 2013-12-06 LAB — CULTURE, BLOOD (SINGLE)

## 2013-12-09 LAB — CULTURE, BLOOD (SINGLE)

## 2013-12-13 ENCOUNTER — Emergency Department: Payer: Self-pay | Admitting: Emergency Medicine

## 2013-12-13 LAB — COMPREHENSIVE METABOLIC PANEL
Albumin: 2.5 g/dL — ABNORMAL LOW (ref 3.4–5.0)
Alkaline Phosphatase: 107 U/L
Anion Gap: 7 (ref 7–16)
BILIRUBIN TOTAL: 0.3 mg/dL (ref 0.2–1.0)
BUN: 4 mg/dL — ABNORMAL LOW (ref 7–18)
CO2: 29 mmol/L (ref 21–32)
Calcium, Total: 7.9 mg/dL — ABNORMAL LOW (ref 8.5–10.1)
Chloride: 102 mmol/L (ref 98–107)
Creatinine: 1.59 mg/dL — ABNORMAL HIGH (ref 0.60–1.30)
EGFR (African American): 43 — ABNORMAL LOW
EGFR (Non-African Amer.): 35 — ABNORMAL LOW
Glucose: 109 mg/dL — ABNORMAL HIGH (ref 65–99)
OSMOLALITY: 273 (ref 275–301)
POTASSIUM: 3 mmol/L — AB (ref 3.5–5.1)
SGOT(AST): 63 U/L — ABNORMAL HIGH (ref 15–37)
SGPT (ALT): 53 U/L
SODIUM: 138 mmol/L (ref 136–145)
Total Protein: 7.4 g/dL (ref 6.4–8.2)

## 2013-12-13 LAB — URINALYSIS, COMPLETE
Bacteria: NONE SEEN
Bilirubin,UR: NEGATIVE
Glucose,UR: 50 mg/dL (ref 0–75)
Ketone: NEGATIVE
Leukocyte Esterase: NEGATIVE
Nitrite: NEGATIVE
PH: 8 (ref 4.5–8.0)
Protein: >=500
RBC,UR: 2 /HPF (ref 0–5)
SPECIFIC GRAVITY: 1.005 (ref 1.003–1.030)
Squamous Epithelial: 1
WBC UR: <1 /HPF (ref 0–5)

## 2013-12-13 LAB — CBC
HCT: 28.7 % — ABNORMAL LOW (ref 35.0–47.0)
HGB: 9.3 g/dL — ABNORMAL LOW (ref 12.0–16.0)
MCH: 29.7 pg (ref 26.0–34.0)
MCHC: 32.5 g/dL (ref 32.0–36.0)
MCV: 91 fL (ref 80–100)
Platelet: 206 10*3/uL (ref 150–440)
RBC: 3.14 10*6/uL — ABNORMAL LOW (ref 3.80–5.20)
RDW: 15 % — AB (ref 11.5–14.5)
WBC: 4.7 10*3/uL (ref 3.6–11.0)

## 2013-12-13 LAB — PROTIME-INR
INR: 1
Prothrombin Time: 12.7 secs (ref 11.5–14.7)

## 2013-12-13 LAB — TROPONIN I: TROPONIN-I: 0.08 ng/mL — AB

## 2013-12-14 LAB — CBC CANCER CENTER
Basophil #: 0 x10 3/mm (ref 0.0–0.1)
Basophil %: 0.6 %
Eosinophil #: 0.2 x10 3/mm (ref 0.0–0.7)
Eosinophil %: 4.3 %
HCT: 30.8 % — ABNORMAL LOW (ref 35.0–47.0)
HGB: 9.9 g/dL — ABNORMAL LOW (ref 12.0–16.0)
Lymphocyte #: 1.6 x10 3/mm (ref 1.0–3.6)
Lymphocyte %: 32.3 %
MCH: 29.3 pg (ref 26.0–34.0)
MCHC: 32 g/dL (ref 32.0–36.0)
MCV: 92 fL (ref 80–100)
Monocyte #: 0.5 x10 3/mm (ref 0.2–0.9)
Monocyte %: 10.6 %
Neutrophil #: 2.7 x10 3/mm (ref 1.4–6.5)
Neutrophil %: 52.2 %
Platelet: 237 x10 3/mm (ref 150–440)
RBC: 3.37 10*6/uL — ABNORMAL LOW (ref 3.80–5.20)
RDW: 15.5 % — ABNORMAL HIGH (ref 11.5–14.5)
WBC: 5.1 x10 3/mm (ref 3.6–11.0)

## 2013-12-14 LAB — BASIC METABOLIC PANEL
Anion Gap: 5 — ABNORMAL LOW (ref 7–16)
BUN: 10 mg/dL (ref 7–18)
CREATININE: 2.67 mg/dL — AB (ref 0.60–1.30)
Calcium, Total: 8.4 mg/dL — ABNORMAL LOW (ref 8.5–10.1)
Chloride: 104 mmol/L (ref 98–107)
Co2: 30 mmol/L (ref 21–32)
EGFR (African American): 24 — ABNORMAL LOW
EGFR (Non-African Amer.): 19 — ABNORMAL LOW
GLUCOSE: 170 mg/dL — AB (ref 65–99)
Osmolality: 281 (ref 275–301)
POTASSIUM: 3.3 mmol/L — AB (ref 3.5–5.1)
Sodium: 139 mmol/L (ref 136–145)

## 2013-12-14 LAB — HEPATIC FUNCTION PANEL A (ARMC)
Albumin: 2.5 g/dL — ABNORMAL LOW (ref 3.4–5.0)
Alkaline Phosphatase: 109 U/L
Bilirubin, Direct: <0.1 mg/dL (ref 0.0–0.2)
Bilirubin,Total: 0.4 mg/dL (ref 0.2–1.0)
SGOT(AST): 59 U/L — ABNORMAL HIGH (ref 15–37)
SGPT (ALT): 51 U/L
Total Protein: 7.6 g/dL (ref 6.4–8.2)

## 2013-12-25 ENCOUNTER — Ambulatory Visit: Payer: Self-pay | Admitting: Internal Medicine

## 2013-12-28 LAB — CBC CANCER CENTER
Basophil #: 0 x10 3/mm (ref 0.0–0.1)
Basophil %: 1.2 %
EOS PCT: 4.2 %
Eosinophil #: 0.1 x10 3/mm (ref 0.0–0.7)
HCT: 30.6 % — ABNORMAL LOW (ref 35.0–47.0)
HGB: 10 g/dL — AB (ref 12.0–16.0)
LYMPHS ABS: 0.8 x10 3/mm — AB (ref 1.0–3.6)
LYMPHS PCT: 34.3 %
MCH: 29.2 pg (ref 26.0–34.0)
MCHC: 32.7 g/dL (ref 32.0–36.0)
MCV: 89 fL (ref 80–100)
Monocyte #: 0.2 x10 3/mm (ref 0.2–0.9)
Monocyte %: 7.3 %
NEUTROS PCT: 53 %
Neutrophil #: 1.3 x10 3/mm — ABNORMAL LOW (ref 1.4–6.5)
Platelet: 214 x10 3/mm (ref 150–440)
RBC: 3.42 10*6/uL — ABNORMAL LOW (ref 3.80–5.20)
RDW: 15.5 % — ABNORMAL HIGH (ref 11.5–14.5)
WBC: 2.4 x10 3/mm — ABNORMAL LOW (ref 3.6–11.0)

## 2014-01-04 LAB — CBC CANCER CENTER
BASOS ABS: 0 x10 3/mm (ref 0.0–0.1)
Basophil %: 0.6 %
EOS PCT: 3.1 %
Eosinophil #: 0.1 x10 3/mm (ref 0.0–0.7)
HCT: 30.3 % — AB (ref 35.0–47.0)
HGB: 9.9 g/dL — ABNORMAL LOW (ref 12.0–16.0)
LYMPHS ABS: 1.9 x10 3/mm (ref 1.0–3.6)
Lymphocyte %: 41.1 %
MCH: 29.3 pg (ref 26.0–34.0)
MCHC: 32.5 g/dL (ref 32.0–36.0)
MCV: 90 fL (ref 80–100)
MONO ABS: 0.3 x10 3/mm (ref 0.2–0.9)
MONOS PCT: 7.7 %
NEUTROS PCT: 47.5 %
Neutrophil #: 2.1 x10 3/mm (ref 1.4–6.5)
Platelet: 191 x10 3/mm (ref 150–440)
RBC: 3.37 10*6/uL — AB (ref 3.80–5.20)
RDW: 15.9 % — ABNORMAL HIGH (ref 11.5–14.5)
WBC: 4.5 x10 3/mm (ref 3.6–11.0)

## 2014-01-11 LAB — CBC CANCER CENTER
BASOS PCT: 0.4 %
Basophil #: 0 x10 3/mm (ref 0.0–0.1)
Eosinophil #: 0 x10 3/mm (ref 0.0–0.7)
Eosinophil %: 0.2 %
HCT: 29 % — ABNORMAL LOW (ref 35.0–47.0)
HGB: 9.4 g/dL — ABNORMAL LOW (ref 12.0–16.0)
LYMPHS PCT: 29.8 %
Lymphocyte #: 0.6 x10 3/mm — ABNORMAL LOW (ref 1.0–3.6)
MCH: 29.3 pg (ref 26.0–34.0)
MCHC: 32.3 g/dL (ref 32.0–36.0)
MCV: 91 fL (ref 80–100)
Monocyte #: 0.1 x10 3/mm — ABNORMAL LOW (ref 0.2–0.9)
Monocyte %: 2.6 %
NEUTROS ABS: 1.3 x10 3/mm — AB (ref 1.4–6.5)
Neutrophil %: 67 %
PLATELETS: 193 x10 3/mm (ref 150–440)
RBC: 3.19 10*6/uL — ABNORMAL LOW (ref 3.80–5.20)
RDW: 15.4 % — ABNORMAL HIGH (ref 11.5–14.5)
WBC: 1.9 x10 3/mm — AB (ref 3.6–11.0)

## 2014-01-11 LAB — HEPATIC FUNCTION PANEL A (ARMC)
ALK PHOS: 78 U/L
ALT: 35 U/L
Albumin: 3 g/dL — ABNORMAL LOW (ref 3.4–5.0)
Bilirubin, Direct: 0.1 mg/dL (ref 0.0–0.2)
Bilirubin,Total: 0.3 mg/dL (ref 0.2–1.0)
SGOT(AST): 21 U/L (ref 15–37)
Total Protein: 7.1 g/dL (ref 6.4–8.2)

## 2014-01-15 ENCOUNTER — Observation Stay: Payer: Self-pay | Admitting: Internal Medicine

## 2014-01-15 LAB — COMPREHENSIVE METABOLIC PANEL
Albumin: 2.5 g/dL — ABNORMAL LOW (ref 3.4–5.0)
Alkaline Phosphatase: 57 U/L
Anion Gap: 9 (ref 7–16)
BUN: 43 mg/dL — ABNORMAL HIGH (ref 7–18)
Bilirubin,Total: 0.3 mg/dL (ref 0.2–1.0)
Calcium, Total: 7.4 mg/dL — ABNORMAL LOW (ref 8.5–10.1)
Chloride: 108 mmol/L — ABNORMAL HIGH (ref 98–107)
Co2: 22 mmol/L (ref 21–32)
Creatinine: 3.22 mg/dL — ABNORMAL HIGH (ref 0.60–1.30)
EGFR (African American): 19 — ABNORMAL LOW
EGFR (Non-African Amer.): 16 — ABNORMAL LOW
Glucose: 280 mg/dL — ABNORMAL HIGH (ref 65–99)
Osmolality: 298 (ref 275–301)
Potassium: 3.7 mmol/L (ref 3.5–5.1)
SGOT(AST): 28 U/L (ref 15–37)
SGPT (ALT): 37 U/L
Sodium: 139 mmol/L (ref 136–145)
Total Protein: 5.7 g/dL — ABNORMAL LOW (ref 6.4–8.2)

## 2014-01-15 LAB — CBC WITH DIFFERENTIAL/PLATELET
Basophil #: 0 10*3/uL (ref 0.0–0.1)
Basophil %: 0.2 %
Eosinophil #: 0.1 10*3/uL (ref 0.0–0.7)
Eosinophil %: 3 %
HCT: 27 % — ABNORMAL LOW (ref 35.0–47.0)
HGB: 8.7 g/dL — ABNORMAL LOW (ref 12.0–16.0)
Lymphocyte #: 1.1 10*3/uL (ref 1.0–3.6)
Lymphocyte %: 46 %
MCH: 29.9 pg (ref 26.0–34.0)
MCHC: 32.3 g/dL (ref 32.0–36.0)
MCV: 93 fL (ref 80–100)
Monocyte #: 0.1 x10 3/mm — ABNORMAL LOW (ref 0.2–0.9)
Monocyte %: 5.5 %
Neutrophil #: 1 10*3/uL — ABNORMAL LOW (ref 1.4–6.5)
Neutrophil %: 45.3 %
Platelet: 155 10*3/uL (ref 150–440)
RBC: 2.92 10*6/uL — ABNORMAL LOW (ref 3.80–5.20)
RDW: 16.1 % — ABNORMAL HIGH (ref 11.5–14.5)
WBC: 2.3 10*3/uL — ABNORMAL LOW (ref 3.6–11.0)

## 2014-01-15 LAB — TSH: Thyroid Stimulating Horm: 0.863 u[IU]/mL

## 2014-01-15 LAB — PHOSPHORUS: PHOSPHORUS: 3.5 mg/dL (ref 2.5–4.9)

## 2014-01-16 LAB — CBC WITH DIFFERENTIAL/PLATELET
Basophil #: 0 10*3/uL (ref 0.0–0.1)
Basophil %: 0.7 %
Eosinophil #: 0.1 10*3/uL (ref 0.0–0.7)
Eosinophil %: 3.2 %
HCT: 25.9 % — ABNORMAL LOW (ref 35.0–47.0)
HGB: 8.4 g/dL — ABNORMAL LOW (ref 12.0–16.0)
Lymphocyte #: 1.1 10*3/uL (ref 1.0–3.6)
Lymphocyte %: 71.6 %
MCH: 29.7 pg (ref 26.0–34.0)
MCHC: 32.3 g/dL (ref 32.0–36.0)
MCV: 92 fL (ref 80–100)
Monocyte #: 0.1 x10 3/mm — ABNORMAL LOW (ref 0.2–0.9)
Monocyte %: 6.4 %
Neutrophil #: 0.3 10*3/uL — ABNORMAL LOW (ref 1.4–6.5)
Neutrophil %: 18.1 %
Platelet: 151 10*3/uL (ref 150–440)
RBC: 2.83 10*6/uL — ABNORMAL LOW (ref 3.80–5.20)
RDW: 16.8 % — ABNORMAL HIGH (ref 11.5–14.5)
WBC: 1.6 10*3/uL — CL (ref 3.6–11.0)

## 2014-01-16 LAB — BASIC METABOLIC PANEL
Anion Gap: 5 — ABNORMAL LOW (ref 7–16)
BUN: 15 mg/dL (ref 7–18)
Calcium, Total: 8 mg/dL — ABNORMAL LOW (ref 8.5–10.1)
Chloride: 106 mmol/L (ref 98–107)
Co2: 31 mmol/L (ref 21–32)
Creatinine: 2.21 mg/dL — ABNORMAL HIGH (ref 0.60–1.30)
EGFR (African American): 29 — ABNORMAL LOW
EGFR (Non-African Amer.): 24 — ABNORMAL LOW
Glucose: 121 mg/dL — ABNORMAL HIGH (ref 65–99)
Osmolality: 285 (ref 275–301)
Potassium: 4.1 mmol/L (ref 3.5–5.1)
Sodium: 142 mmol/L (ref 136–145)

## 2014-01-16 LAB — LIPID PANEL
Cholesterol: 150 mg/dL (ref 0–200)
HDL Cholesterol: 45 mg/dL (ref 40–60)
Ldl Cholesterol, Calc: 70 mg/dL (ref 0–100)
Triglycerides: 174 mg/dL (ref 0–200)
VLDL Cholesterol, Calc: 35 mg/dL (ref 5–40)

## 2014-01-16 LAB — HEMOGLOBIN A1C: Hemoglobin A1C: 9.4 % — ABNORMAL HIGH (ref 4.2–6.3)

## 2014-01-16 LAB — TSH: Thyroid Stimulating Horm: 1.39 u[IU]/mL

## 2014-01-17 LAB — CBC WITH DIFFERENTIAL/PLATELET
Basophil #: 0 10*3/uL (ref 0.0–0.1)
Basophil %: 0.7 %
EOS ABS: 0.1 10*3/uL (ref 0.0–0.7)
Eosinophil %: 3.3 %
HCT: 25.5 % — AB (ref 35.0–47.0)
HGB: 8.3 g/dL — AB (ref 12.0–16.0)
LYMPHS PCT: 71.2 %
Lymphocyte #: 1.3 10*3/uL (ref 1.0–3.6)
MCH: 29.8 pg (ref 26.0–34.0)
MCHC: 32.3 g/dL (ref 32.0–36.0)
MCV: 92 fL (ref 80–100)
MONOS PCT: 8.2 %
Monocyte #: 0.1 x10 3/mm — ABNORMAL LOW (ref 0.2–0.9)
NEUTROS PCT: 16.6 %
Neutrophil #: 0.3 10*3/uL — ABNORMAL LOW (ref 1.4–6.5)
PLATELETS: 157 10*3/uL (ref 150–440)
RBC: 2.77 10*6/uL — ABNORMAL LOW (ref 3.80–5.20)
RDW: 16.9 % — AB (ref 11.5–14.5)
WBC: 1.8 10*3/uL — CL (ref 3.6–11.0)

## 2014-01-17 LAB — BASIC METABOLIC PANEL
Anion Gap: 6 — ABNORMAL LOW (ref 7–16)
BUN: 18 mg/dL (ref 7–18)
Calcium, Total: 7.8 mg/dL — ABNORMAL LOW (ref 8.5–10.1)
Chloride: 104 mmol/L (ref 98–107)
Co2: 31 mmol/L (ref 21–32)
Creatinine: 2.98 mg/dL — ABNORMAL HIGH (ref 0.60–1.30)
EGFR (African American): 21 — ABNORMAL LOW
GFR CALC NON AF AMER: 17 — AB
Glucose: 173 mg/dL — ABNORMAL HIGH (ref 65–99)
OSMOLALITY: 287 (ref 275–301)
POTASSIUM: 4 mmol/L (ref 3.5–5.1)
SODIUM: 141 mmol/L (ref 136–145)

## 2014-01-17 LAB — URINALYSIS, COMPLETE
BLOOD: NEGATIVE
Bilirubin,UR: NEGATIVE
Glucose,UR: NEGATIVE mg/dL (ref 0–75)
KETONE: NEGATIVE
Leukocyte Esterase: NEGATIVE
NITRITE: NEGATIVE
Ph: 9 (ref 4.5–8.0)
Protein: >=500
RBC,UR: 2 /HPF (ref 0–5)
Specific Gravity: 1.009 (ref 1.003–1.030)
Squamous Epithelial: 1
WBC UR: NONE SEEN /HPF (ref 0–5)

## 2014-01-17 LAB — PHOSPHORUS: Phosphorus: 3.2 mg/dL (ref 2.5–4.9)

## 2014-01-18 LAB — CREATININE, SERUM
Creatinine: 2.63 mg/dL — ABNORMAL HIGH (ref 0.60–1.30)
EGFR (African American): 24 — ABNORMAL LOW
EGFR (Non-African Amer.): 20 — ABNORMAL LOW

## 2014-01-18 LAB — HEMOGLOBIN: HGB: 8.3 g/dL — ABNORMAL LOW (ref 12.0–16.0)

## 2014-01-19 LAB — PHOSPHORUS: Phosphorus: 3.9 mg/dL (ref 2.5–4.9)

## 2014-01-25 ENCOUNTER — Ambulatory Visit: Payer: Self-pay | Admitting: Internal Medicine

## 2014-02-01 LAB — CBC CANCER CENTER
BASOS ABS: 0 x10 3/mm (ref 0.0–0.1)
BASOS PCT: 0.4 %
Eosinophil #: 0 x10 3/mm (ref 0.0–0.7)
Eosinophil %: 0 %
HCT: 31.6 % — AB (ref 35.0–47.0)
HGB: 10.3 g/dL — AB (ref 12.0–16.0)
LYMPHS PCT: 19.8 %
Lymphocyte #: 0.7 x10 3/mm — ABNORMAL LOW (ref 1.0–3.6)
MCH: 30.2 pg (ref 26.0–34.0)
MCHC: 32.4 g/dL (ref 32.0–36.0)
MCV: 93 fL (ref 80–100)
MONO ABS: 0.1 x10 3/mm — AB (ref 0.2–0.9)
MONOS PCT: 2.8 %
NEUTROS ABS: 2.6 x10 3/mm (ref 1.4–6.5)
Neutrophil %: 77 %
Platelet: 237 x10 3/mm (ref 150–440)
RBC: 3.4 10*6/uL — ABNORMAL LOW (ref 3.80–5.20)
RDW: 18.9 % — ABNORMAL HIGH (ref 11.5–14.5)
WBC: 3.4 x10 3/mm — AB (ref 3.6–11.0)

## 2014-02-08 LAB — CBC CANCER CENTER
BASOS PCT: 0.3 %
Basophil #: 0 x10 3/mm (ref 0.0–0.1)
EOS ABS: 0 x10 3/mm (ref 0.0–0.7)
Eosinophil %: 0.1 %
HCT: 29.7 % — ABNORMAL LOW (ref 35.0–47.0)
HGB: 9.5 g/dL — AB (ref 12.0–16.0)
LYMPHS PCT: 21.8 %
Lymphocyte #: 0.9 x10 3/mm — ABNORMAL LOW (ref 1.0–3.6)
MCH: 29.9 pg (ref 26.0–34.0)
MCHC: 32.1 g/dL (ref 32.0–36.0)
MCV: 93 fL (ref 80–100)
MONOS PCT: 1.4 %
Monocyte #: 0.1 x10 3/mm — ABNORMAL LOW (ref 0.2–0.9)
NEUTROS PCT: 76.4 %
Neutrophil #: 3 x10 3/mm (ref 1.4–6.5)
Platelet: 175 x10 3/mm (ref 150–440)
RBC: 3.18 10*6/uL — AB (ref 3.80–5.20)
RDW: 18.3 % — ABNORMAL HIGH (ref 11.5–14.5)
WBC: 3.9 x10 3/mm (ref 3.6–11.0)

## 2014-02-22 LAB — BASIC METABOLIC PANEL
Anion Gap: 7 (ref 7–16)
BUN: 21 mg/dL — ABNORMAL HIGH (ref 7–18)
CALCIUM: 8.5 mg/dL (ref 8.5–10.1)
CO2: 30 mmol/L (ref 21–32)
Chloride: 104 mmol/L (ref 98–107)
Creatinine: 4.1 mg/dL — ABNORMAL HIGH (ref 0.60–1.30)
EGFR (Non-African Amer.): 12 — ABNORMAL LOW
GFR CALC AF AMER: 14 — AB
Glucose: 155 mg/dL — ABNORMAL HIGH (ref 65–99)
Osmolality: 287 (ref 275–301)
Potassium: 3.9 mmol/L (ref 3.5–5.1)
Sodium: 141 mmol/L (ref 136–145)

## 2014-02-22 LAB — CBC CANCER CENTER
BASOS ABS: 0 x10 3/mm (ref 0.0–0.1)
BASOS PCT: 1.7 %
EOS ABS: 0.1 x10 3/mm (ref 0.0–0.7)
Eosinophil %: 5.4 %
HCT: 30.7 % — ABNORMAL LOW (ref 35.0–47.0)
HGB: 9.9 g/dL — ABNORMAL LOW (ref 12.0–16.0)
LYMPHS ABS: 1.6 x10 3/mm (ref 1.0–3.6)
LYMPHS PCT: 60.6 %
MCH: 30 pg (ref 26.0–34.0)
MCHC: 32.3 g/dL (ref 32.0–36.0)
MCV: 93 fL (ref 80–100)
Monocyte #: 0.6 x10 3/mm (ref 0.2–0.9)
Monocyte %: 23.5 %
NEUTROS ABS: 0.2 x10 3/mm — AB (ref 1.4–6.5)
Neutrophil %: 8.8 %
Platelet: 248 x10 3/mm (ref 150–440)
RBC: 3.3 10*6/uL — ABNORMAL LOW (ref 3.80–5.20)
RDW: 18.2 % — ABNORMAL HIGH (ref 11.5–14.5)
WBC: 2.7 x10 3/mm — ABNORMAL LOW (ref 3.6–11.0)

## 2014-02-22 LAB — HEPATIC FUNCTION PANEL A (ARMC)
ALBUMIN: 3 g/dL — AB (ref 3.4–5.0)
Alkaline Phosphatase: 100 U/L (ref 46–116)
BILIRUBIN DIRECT: 0.1 mg/dL (ref 0.0–0.2)
BILIRUBIN TOTAL: 0.2 mg/dL (ref 0.2–1.0)
SGOT(AST): 24 U/L (ref 15–37)
SGPT (ALT): 33 U/L (ref 14–63)
Total Protein: 6.5 g/dL (ref 6.4–8.2)

## 2014-02-25 ENCOUNTER — Ambulatory Visit: Payer: Self-pay | Admitting: Internal Medicine

## 2014-03-01 LAB — CBC CANCER CENTER
BASOS ABS: 0 x10 3/mm (ref 0.0–0.1)
Basophil %: 0.5 %
Eosinophil #: 0.1 x10 3/mm (ref 0.0–0.7)
Eosinophil %: 1.5 %
HCT: 33.2 % — AB (ref 35.0–47.0)
HGB: 10.7 g/dL — ABNORMAL LOW (ref 12.0–16.0)
Lymphocyte #: 1.4 x10 3/mm (ref 1.0–3.6)
Lymphocyte %: 40.8 %
MCH: 29.5 pg (ref 26.0–34.0)
MCHC: 32.2 g/dL (ref 32.0–36.0)
MCV: 92 fL (ref 80–100)
MONOS PCT: 17.3 %
Monocyte #: 0.6 x10 3/mm (ref 0.2–0.9)
Neutrophil #: 1.4 x10 3/mm (ref 1.4–6.5)
Neutrophil %: 39.9 %
Platelet: 209 x10 3/mm (ref 150–440)
RBC: 3.63 10*6/uL — ABNORMAL LOW (ref 3.80–5.20)
RDW: 18.1 % — AB (ref 11.5–14.5)
WBC: 3.5 x10 3/mm — ABNORMAL LOW (ref 3.6–11.0)

## 2014-03-08 ENCOUNTER — Inpatient Hospital Stay: Payer: Self-pay | Admitting: Internal Medicine

## 2014-03-10 ENCOUNTER — Inpatient Hospital Stay: Payer: Self-pay | Admitting: Internal Medicine

## 2014-03-26 ENCOUNTER — Ambulatory Visit
Admit: 2014-03-26 | Disposition: A | Discharge status: Home / Self Care | Payer: Self-pay | Attending: Internal Medicine | Admitting: Internal Medicine

## 2014-04-24 ENCOUNTER — Ambulatory Visit: Payer: Self-pay | Admitting: Vascular Surgery

## 2014-04-26 ENCOUNTER — Ambulatory Visit
Admit: 2014-04-26 | Disposition: A | Discharge status: Home / Self Care | Payer: Self-pay | Attending: Internal Medicine | Admitting: Internal Medicine

## 2014-05-03 ENCOUNTER — Ambulatory Visit
Admit: 2014-05-03 | Disposition: A | Discharge status: Home / Self Care | Payer: Self-pay | Attending: Vascular Surgery | Admitting: Vascular Surgery

## 2014-05-07 LAB — CBC CANCER CENTER
Basophil #: 0 x10 3/mm (ref 0.0–0.1)
Basophil %: 0.8 %
EOS PCT: 8 %
Eosinophil #: 0.2 x10 3/mm (ref 0.0–0.7)
HCT: 31.3 % — ABNORMAL LOW (ref 35.0–47.0)
HGB: 10 g/dL — ABNORMAL LOW (ref 12.0–16.0)
LYMPHS ABS: 1 x10 3/mm (ref 1.0–3.6)
Lymphocyte %: 32.7 %
MCH: 27.3 pg (ref 26.0–34.0)
MCHC: 31.8 g/dL — ABNORMAL LOW (ref 32.0–36.0)
MCV: 86 fL (ref 80–100)
MONOS PCT: 13.3 %
Monocyte #: 0.4 x10 3/mm (ref 0.2–0.9)
NEUTROS PCT: 45.2 %
Neutrophil #: 1.4 x10 3/mm (ref 1.4–6.5)
PLATELETS: 161 x10 3/mm (ref 150–440)
RBC: 3.65 10*6/uL — AB (ref 3.80–5.20)
RDW: 15.9 % — AB (ref 11.5–14.5)
WBC: 3 x10 3/mm — AB (ref 3.6–11.0)

## 2014-05-09 ENCOUNTER — Ambulatory Visit: Payer: Medicaid Other | Admitting: Neurology

## 2014-05-15 ENCOUNTER — Telehealth: Payer: Self-pay | Admitting: *Deleted

## 2014-05-15 ENCOUNTER — Encounter: Payer: Self-pay | Admitting: Neurology

## 2014-05-15 ENCOUNTER — Ambulatory Visit (INDEPENDENT_AMBULATORY_CARE_PROVIDER_SITE_OTHER): Payer: Medicare Other | Admitting: Neurology

## 2014-05-15 VITALS — Temp 97.4°F | Ht 61.0 in

## 2014-05-15 DIAGNOSIS — E108 Type 1 diabetes mellitus with unspecified complications: Secondary | ICD-10-CM

## 2014-05-15 DIAGNOSIS — I639 Cerebral infarction, unspecified: Secondary | ICD-10-CM

## 2014-05-15 DIAGNOSIS — I10 Essential (primary) hypertension: Secondary | ICD-10-CM

## 2014-05-15 DIAGNOSIS — N186 End stage renal disease: Secondary | ICD-10-CM

## 2014-05-15 NOTE — Patient Instructions (Signed)
Overall you are doing fairly well but I do want to suggest a few things today:   Remember to drink plenty of fluid, eat healthy meals and do not skip any meals. Try to eat protein with a every meal and eat a healthy snack such as fruit or nuts in between meals. Try to keep a regular sleep-wake schedule and try to exercise daily, particularly in the form of walking, 20-30 minutes a day, if you can.   As far as your medications are concerned, I would like to suggest: continue current medications  I would like to see you back in 3 months, sooner if we need to. Please call us with any interim questions, concerns, problems, updates or refill requests.   Please also call us for any test results so we can go over those with you on the phone.  My clinical assistant and will answer any of your questions and relay your messages to me and also relay most of my messages to you.   Our phone number is [phone]. We also have an after hours call service for urgent matters and there is a physician on-call for urgent questions. For any emergencies you know to call 911 or go to the nearest emergency room

## 2014-05-15 NOTE — Progress Notes (Addendum)
[facility]    Provider:  Dr Jaynee Eagles Referring Provider: Lenor Coffin, PA-C Primary Care Physician:  Victory Dakin, MD  CC:  stroke  HPI:  Jessica Duran is a 59 y.o. female here as a referral from Dr. Tamala Julian for stroke, Acute cerebellar infarct.   PMHx ESRD, DM.CVA with resultant left-sided weakness, htn,,  She was living with boyfriend. He called the ambulance. 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 She feels ok. She is still dizzy. She is seeing 4 of everything. She is wearing this patch over her eye. When she gets up her head spins which causes her to jerk. Her vision is blurry. She is in physical therapy. She is in speech therapy. She is not able to ambulate. She doesn't feel like she is improving, her head still spins. She is a poor historian. Started with the spinning, felt uncoordinated. She was seeing 4 of everything. She feels incoordinated. Patient is a very poor historian, and limited records provided.  No imaging or labs provided, no notes from inpatient admission.  Reviewed 30 pages of daily medication administration and nursing notes from her rehab facility. History and physical from rehabilitation admission was provided but does not provide all needed documentation.  Addendum 06/24/2014: Records received from [place]. 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 She has a past medical history of end-stage renal disease on hemodialysis, hypertension, diabetes, diabetic retinopathy and constipation, prior CVA residual left-sided weakness. Patient was admitted after 3 days of nausea and vomiting and imaging showed an acute cerebellar cerebrovascular accident presenting with dizziness, nausea, diplopia. MRI was positive for left middle cerebellar peduncle infarct. The patient was on aspirin as an outpatient and changed to Plavix along with low  dose Eliquis for a right IJ clot seen on carotid Dopplers. Notes state that IJ thrombus was diagnosed 2 months prior to admission and plan was to keep her on the Eliquis for at least 6 months. Meclizine was started to help with symptomatic dizziness. She was also discharged on Norvasc, clonidine and Coreg for her hypertension. She was started on low-dose glipizide for diabetes, previously not on any diabetes medication.   Discharge medications for stroke prevention at time of discharge 03/13/2014 included Eliquis 2.5 mg by mouth twice a day, atorvastatin 20 mg by mouth daily, Plavix 75 mg by mouth daily. She was instructed to patch her eyes alternating between both in follow-up with neurology in 3 months. Laboratory studies included LDL 87, anemia of chronic disease with hemoglobin 9.5, hemoglobin A1c of 9. She was discharged to subacute rehabilitation.   Ultrasound carotid Doppler bilateral: 03/11/2014 showed minor echogenic shadowing plaque formation in the right carotid artery without hemodynamically significant right ICA stenosis, velocity elevation, are turbulent flow, degree of narrowing less than 50%. Left carotid artery showed similar scattered minor echogenic plaques without hemodynamically significant left ICA stenosis, velocity elevation are turbulent flow. 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 Impression was minor carotid atherosclerosis without hemodynamically significant ICA stenosis by ultrasound, degree of narrowing less than 50% bilaterally, no significant change  MRI of the brain showed a 1 cm acute infarction within the left middle cerebellar peduncle, no other acute infarction. There are extensive old infarctions affecting the pons. There are old infarctions within the cerebellum. There are old infarctions affecting the thalami, basal ganglia and throughout the cerebral hemispheric white matter. There is cortical Cleo Sisson the right frontal region and at  both  temporal tips. This could relate old head injury or old cortical infarctions. No evidence of neoplastic mass lesion, acute hemorrhage, hydrocephalus or extra-axial collection. There are foci of hemosiderin deposition scattered within areas of abnormal white matter consistent with microhemorrhages, related old infarctions or old head injury.   Echo Doppler showed ejection fraction 60-65% with impaired relaxation of left ventricular diastolic filling.   Review of Systems: Patient complains of symptoms per HPI as well as the following symptoms: Denies chest pain shortness of breath. Pertinent negatives per HPI. All others negative.   History   Social History  . Marital Status: Widowed    Spouse Name: N/A  . Number of Children: 4  . Years of Education: 12   Occupational History  . Not on file.   Social History Main Topics  . Smoking status: Former Smoker    Types: Cigarettes    Quit date: 01/25/2013  . Smokeless tobacco: Not on file  . Alcohol Use: No  . Drug Use: Yes    Special: Cocaine  . Sexual Activity: Not on file   Other Topics Concern  . Not on file   Social History Narrative   Lives at [facility] center.   Right handed.   Caffeine use: Drinks coffee occas    Family History  Problem Relation Age of Onset  . Hypertension Father     Past Medical History  Diagnosis Date  . History of stroke   . Hypertension   . Chronic kidney disease     stage III  . Diastolic congestive heart failure     ef 55-60%  . Fibromyalgia     Past Surgical History  Procedure Laterality Date  . Appendectomy    . Cesarean section      x3    Current Outpatient Prescriptions  Medication Sig Dispense Refill  . amLODipine (NORVASC) 10 MG tablet Take 10 mg by mouth daily.    Marland Kitchen apixaban (ELIQUIS) 2.5 MG TABS tablet Take 2.5 mg by mouth 2 (two) times daily.    . carvedilol (COREG) 6.25 MG tablet Take 12.5 mg by mouth daily.    Marland Kitchen trolamine salicylate (ASPERCREME) 10 % cream  Apply 1 application topically as needed for muscle pain (Apply to left shoulder topically every day and evening shift for pain).    Marland Kitchen aspirin 81 MG tablet Take 81 mg by mouth daily.    . cloNIDine (CATAPRES) 0.2 MG tablet Take 0.2 mg by mouth 2 (two) times daily.    Marland Kitchen gabapentin (NEURONTIN) 300 MG capsule Take 300 mg by mouth daily.    . hydrALAZINE (APRESOLINE) 100 MG tablet Take 100 mg by mouth 4 (four) times daily.    . isosorbide dinitrate (ISORDIL) 40 MG tablet Take 40 mg by mouth 3 (three) times daily.    . ondansetron (ZOFRAN) 4 MG tablet Take 4 mg by mouth every 8 (eight) hours as needed for nausea.    . simvastatin (ZOCOR) 20 MG tablet Take 20 mg by mouth daily.    Marland Kitchen zolpidem (AMBIEN) 5 MG tablet Take 5 mg by mouth at bedtime as needed for sleep.     No current facility-administered medications for this visit.    Allergies as of 05/15/2014 - Review Complete 05/15/2014  Allergen Reaction Noted  . Acetaminophen  05/15/2014  . Hydralazine  05/15/2014  . Isosorbide  05/15/2014  . Levaquin [levofloxacin in d5w]  05/15/2014  . Oxycodone  07/04/2012  . Oxycodone-acetaminophen  05/15/2014  . Penicillins  07/04/2012    Vitals: Temp(Src) 97.4 F (36.3 C)  Ht 5\' 1"  (1.549 m) Last Weight:  Wt Readings from Last 1 Encounters:  No data found for Wt   Last Height:   Ht Readings from Last 1 Encounters:  05/15/14 5\' 1"  (1.549 m)   Could not record weight due to wheelchair. Respirations 12.   Physical exam: Exam: Gen: NAD, conversant, well nourised, obese, well groomed                     CV: RRR, no MRG. No Carotid Bruits. No peripheral edema, warm, nontender Eyes: Conjunctivae clear without exudates or hemorrhage  Neuro: Detailed Neurologic Exam  Speech:    dysarthria Cognition:    The patient is oriented to person, place, and time;     recent and remote memory impaired ;     language fluent;     normal attention, concentration,     fund of knowledge impaired Cranial  Nerves:    The pupils are equal, round, and reactive to light.  Couldn't visualize. The fundi are normal and spontaneous venous pulsations are present. Visual fields are full to finger confrontation. Extraocular movements are intact. Trigeminal sensation is intact and the muscles of mastication are normal. The face is symmetric(adentulous). The palate elevates in the midline. Hearing intact. Voice is normal. Shoulder shrug is normal. The tongue has normal motion without fasciculations.   Coordination: Left dysmetria on ftn  Gait:    Attempted, difficult supporting own weight  Motor Observation:     no involuntary movements noted. Tone:    Normal muscle tone.    Posture:    Posture is normal.     Strength:    Strength is 3/V in the upper and lower limbs, poor effort      Sensation: intact to LT     Reflex Exam:  DTR's: hypo in the lowers    Deep tendon reflexes in the upper and lower extremities are symmetric bilaterally.   Toes:    The toes are withdrawal bilaterally. Clonus:    Clonus is absent.   Assessment/Plan:    Jessica Duran is a 59 y.o. female here as a referral from Dr. Tamala Julian for stroke, Acute cerebellar infarct.   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 Unfortunately atient is a very poor historian, and limited records provided.  No imaging or labs provided, no notes from inpatient admission.  Reviewed 30 pages of daily medication administration and nursing notes from her rehab facility. History and physical from rehabilitation admission was provided but does not provide all needed documentation.  - Requested inpatient records from [place]. unfortunately until I receive these records I cannot provide a comprehensive assessment and plan for patient. - Continue statin and Eliquis for stroke prevention. Needs close follow-up with primary care to manage vascular  risk factors. - Unclear if patient is on a Eliquis and ASA or plavix, again she is a poor historian. It would be unusual to be on both aspirin or Plavix and Eliquis however, again since I don't have records I cannot make a definitive comment on this. Until I get records would recommend that here facility who probably has access to [facility] records double check that patient should be on Eliquis and aspirin or Plavix. This may likely infer an increased bleeding risk. Would recommend holding aspirin and plavix if patient is taking Eliquis however  again since I don't have the previous records cannot say for sure why this management is in place - Requested records. Records were received (see detailed summary in history of present illness above). Patient was discharged on both Eliquis and Plavix, this may infer an increased bleeding risk. From stroke prevention standpoint, no reason to have patient on both. However, sometimes there are cardiac reasons or other reasons to be on dual medications and if so I would defer to other physician if both are needed. But from a stroke standpoint, Recommend continuing the Eliquis for nonocclusive IJ thrombus and when anticoagulation is complete, Eliquis can be discontinued and Plavix restarted.   Sarina Ill, MD  [facility] [address] [place], [place] [address]  Phone [phone] Fax [phone]

## 2014-05-15 NOTE — Telephone Encounter (Signed)
Dr Jaynee Eagles requested records from [facility] released faxed 05-15-14.

## 2014-05-17 NOTE — Discharge Summary (Signed)
PATIENT NAME:  Jessica Duran, Jessica Duran MR#:  [id number] DATE OF BIRTH:  [birth year]  DATE OF ADMISSION:  08/09/2012 DATE OF DISCHARGE:  08/11/2012  PRIMARY CARE PHYSICIAN: At the [facility].   FINAL DIAGNOSES: 1.  Transient ischemic attack versus medication reaction.  2.  Malignant hypertension.  3.  Chronic kidney disease.  4.  Chronic constipation.  5.  Burning on urination.  6.  History of diastolic congestive heart failure. No signs of congestive heart failure on this hospital stay.   MEDICATIONS ON DISCHARGE: Include lisinopril 20 mg daily, simvastatin 20 mg at bedtime, aspirin 325 mg daily, magnesium citrate 150 mL twice a day as needed for constipation, baclofen 5 mg twice a day, Cipro 500 mg every 12 hours for 3 days, hydralazine 100 mg 4 times a day.   DIET: Low-sodium diet, regular consistency.   ACTIVITY: As tolerated.   FOLLOWUP: In 1 to 2 weeks with your medical doctor.   REASON FOR ADMISSION: The patient was admitted 08/09/2012, discharged 08/11/2012. Came in with left-sided slurred speech, blurring of vision, found to have malignant hypertension. The patient was admitted to the hospital for further evaluation.   LABORATORY AND RADIOLOGICAL DATA DURING THE HOSPITAL COURSE: Included a troponin that was negative. Glucose 84, BUN 19, creatinine 2.01, sodium 141, potassium 4.9, chloride 110, CO2 of 23, calcium 8.5. Liver function tests: AST slightly elevated at 55. INR 0.9. White blood cell count 6.8, hemoglobin and hematocrit 10.3 and 31.4, platelet count of 198. CT scan of the head showed lucency in the posterior limb of the right internal capsule, suggestive of a lacunar infarct. Multiple periventricular lucencies in the brainstem and cerebellum consistent with chronic ischemia. Chest x-ray: Negative. EKG: Normal sinus rhythm, septal infarct age undetermined. Urinalysis on admission negative. LDL 47, HDL 79, triglycerides 78. Ultrasound of the carotids: Negative.  MRI of the brain: No acute intracranial pathology.   HOSPITAL COURSE PER PROBLEM LIST:  1.  For the patient's TIA versus medication reaction, the patient does not like the isosorbide that she was taking and does not want to take it. She thinks that may have been the cause of her symptoms. She also does not like the Seroquel that she has been taking for sleep and thinks that may have been a cause of her symptoms. This could also be TIA. The patient has been noncompliant with her aspirin. I advised her that she must take aspirin on a daily basis, 325 mg daily, in order to prevent further stroke.  2.  Malignant hypertension: Blood pressure very elevated on admission. It was allowed to be high initially and then brought down to 152/71 upon discharge. Lisinopril was added instead of the isosorbide. She is on high-dose hydralazine  3.  Chronic kidney disease: This is stable over a period of time. Would recommend checking a BMP after the starting of low-dose lisinopril.  4.  Chronic constipation: She does take magnesium citrate.  5.  Burning upon urination upon discharge: I did prescribe Cipro, even though her initial urinalysis when she came in was negative.  6.  History of diastolic congestive heart failure: No signs of congestive heart failure on this hospitalization.  7.  Hyperlipidemia: Lipid profile is excellent. Continue simvastatin.   TIME SPENT ON DISCHARGE: 35 minutes.   ____________________________ Tana Conch. Leslye Peer, MD rjw:jm D: 08/11/2012 16:32:38 ET T: 08/11/2012 17:19:20 ET JOB#: [id number]  cc: Tana Conch. Leslye Peer, MD, <Dictator> Marisue Brooklyn MD ELECTRONICALLY SIGNED 08/21/2012 14:51

## 2014-05-17 NOTE — Discharge Summary (Signed)
PATIENT NAME:  Jessica Duran, Jessica Duran MR#:  [id number] DATE OF BIRTH:  May 02, 1955  DATE OF ADMISSION:  05/27/2012 DATE OF DISCHARGE:  05/29/2012  PRESENTING COMPLAINT: Headache and elevated blood pressure.   DISCHARGE DIAGNOSES: 1.  Malignant hypertension.  2.  Cocaine abuse.  3.  Chronic kidney disease stage II. 4.  Tobacco abuse.   DISCHARGE MEDICATIONS: 1.  Clonidine 0.1 mg b.i.d.  2.  Simvastatin 20 mg at bedtime.  3.  Aspirin 81 mg p.o. daily.  4.  Amlodipine 10 mg daily.  5.  Hydralazine 25 mg 3 tablets q. 12.  6.  Acetaminophen/hydrocodone 1 tablet every 6 hours as needed.   DIET: Low sodium diet.   DISCHARGE INSTRUCTIONS:  Follow up with your primary care physician, Dr. Melina Copa, in [place].   LABORATORY AND DIAGNOSTICS:  Echo Doppler showed no source of CVA. EF is 55% to 60%.  Normal left ventricular systolic function.  Impaired relaxation for LV diastolic dysfunction.  Mildly dilated left atrium.  H and H is 10.2 and 29.7 and platelet count is 136. Glucose 72, BUN is 24, creatinine 2.06, sodium 144, potassium is 3.7 and calcium is 8.3. LDL is 105. Cholesterol is 200.  Triglyceride is 98.  HDL is 75. Troponin is 0.04.   Ultrasound carotid Doppler is normal.  UA is negative for UTI.  Positive for proteinuria.   Urine drug screen positive for cocaine.   MRI of the brain: No evidence of acute ischemic change. No intracranial hemorrhage noted.   Chest x-ray:  Borderline cardiomegaly.   EKG suggestive of normal sinus rhythm, septal infarct, ST-T abnormality in inferolateral leads, more suggestive of LVH changes.   BRIEF SUMMARY OF HOSPITAL COURSE: Ms. Pounds is a 59 year old Caucasian female with long-standing history of hypertension, untreated, secondary noncompliance, who comes to the Emergency Room with: 1.  Hypertensive encephalopathy in the setting of malignant hypertension with cocaine abuse. The patient was admitted on the medical floor. Her blood pressure was normalized  after she was kept on  Norvasc, hydralazine and clonidine. Beta blockers were discontinued since urine drug screen was positive for cocaine. Lisinopril was not started since the patient's creatinine was mildly elevated and baseline was not known. MRI of the brain was negative.  Mentation improved. She was continued on p.o. aspirin.  2.  Elevated troponin with chest pain. Abnormal EKG.  EKG showed normal sinus rhythm with flipped T waves laterally, likely all secondary to hypertensive encephalopathy. The patient was given aspirin, nitro paste and blood pressure meds.  Serial cardiac enzymes were negative. No chest pain was noted in the hospital.  3.  Abdominal pain with constipation, improved.  4.  Expression of suicidal ideation with history of depression. The patient was seen by Dr. Franchot Mimes.  Does not require inpatient behavior medicine treatment. She was started on Cymbalta.  The patient did not tolerate it. She felt very nauseated and sick to her stomach.  Cymbalta was discontinued. She is asked to follow up with her primary care physician regarding the same.  5.  Acute renal failure versus CKD.  Appears more secondary to untreated hypertension. ACE inhibitors were not prescribed.  6.  Tobacco abuse. Smoking cessation was advised.          Her hospital stay otherwise remained stable. The patient will follow up with Dr. Melina Copa, primary care physician, in [place].  TIME SPENT: 40 minutes. ____________________________ Hart Rochester Posey Pronto, MD sap:sb D: 05/30/2012 07:16:22 ET T: 05/30/2012 08:03:57 ET JOB#: [id number]  cc: Marshay Slates A.  Posey Pronto, MD, <Dictator> Dr. Melina Copa - [place] Mallerie Blok A Daryle Amis MD ELECTRONICALLY SIGNED 06/13/2012 15:21

## 2014-05-17 NOTE — H&P (Signed)
PATIENT NAME:  Jessica Duran, Jessica Duran MR#:  [id number] DATE OF BIRTH:  10/01/1955  DATE OF ADMISSION:  08/09/2012  PRIMARY CARE PHYSICIAN: Dr. Melina Copa in [place], [place].   REFERRING PHYSICIAN: Vivien Presto, Physician Assistant with Dr. Graciella Freer.   CHIEF COMPLAINT: Left-sided weakness.   HISTORY OF PRESENT ILLNESS: Jessica Duran is a 59 year old African American female with a history of severe systemic hypertension, chronic diastolic congestive heart failure with ejection fraction of 55% to 60% by recent echocardiogram on 05/29/2012, recently admitted on June 2nd and discharged on June 5th after treatment of malignant hypertension associated with elevated troponin attributed to increased demand ischemia. She also had some peripheral edema. The patient stated that upon discharge she was placed on isosorbide along with hydralazine; however, when she took this medicine at home, she developed jerking movements in the left arm along with weakness on the left side of the body associated with slurred speech. The patient took herself off these medications, and she states that over the last 4 to 5 days she was having headache. Then, she decided to place herself on hydrochlorothiazide alone; however, her headache did not improve. Today, she tried again to take the combination of hydralazine with isosorbide, and she reported that again she developed left-sided weakness. It involved the left arm and left leg, along with that slurred speech and some blurring of vision. Also described jerking of left arm. The patient does not tell me whether her blood pressure is controlled at home or elevated. She appears to be upset or gets bothered if I ask a question. It seems that her symptoms started around noon time today, around 2:00; that is about 9 hours ago.   REVIEW OF SYSTEMS:   CONSTITUTIONAL: Denies any fever. No chills. No fatigue.  EYES: Reports blurring of vision. No double vision.  ENT: No hearing  impairment. No sore throat. No dysphagia.  CARDIOVASCULAR: No chest pain. No shortness of breath. No syncope.  RESPIRATORY: No shortness of breath. No chest pain. No cough.  GASTROINTESTINAL: No abdominal pain. No vomiting. No diarrhea.  GENITOURINARY: No dysuria. No frequency of urination. She reports chronic urinary incontinence. Nothing has changed recently.  MUSCULOSKELETAL: Reports some left shoulder pain. No joint pain or swelling other than the shoulder pain. No muscular pain or swelling. She indicates that the shoulder pain is not new.  INTEGUMENTARY: No skin rash. No ulcers.  NEUROLOGY: She has weakness in the left arm and left leg as reported above and slurred speech. No seizure activity. She describes headaches.  PSYCHIATRY: No anxiety. No depression.  ENDOCRINE: No polyuria or polydipsia. No heat or cold intolerance.   PAST MEDICAL HISTORY: Recent admission on June 2nd and discharged on June 5th with severe systemic hypertension associated with elevated troponin attributed to increased demand ischemia. Chronic diastolic congestive heart failure with ejection fraction of 55% to 60% by recent echocardiogram on 05/29/2012. Chronic kidney disease stage III, mild pulmonary hypertension, fibromyalgia and she has a prior history of stroke with left hemiparesis.   PAST SURGICAL HISTORY: Appendectomy and C-section x3.   FAMILY HISTORY: Her father suffered from stroke and died at the age of 4. He also had hypertension. Her mother died from complications of pancreatic cancer.   SOCIAL HABITS: She smokes cigarettes only occasionally and occasionally may use cocaine. The last time was a few months ago. She drinks alcohol only once in a while.   SOCIAL HISTORY: She is widowed. Lives at home with her cousin.   ADMISSION MEDICATIONS:  Simvastatin 20 mg a day. Hydralazine 100 mg 4 times a day. She is not taking it. Isosorbide dinitrate 40 mg 3 times a day. She was not taking it until today. Aspirin  81 mg a day.    ALLERGIES: OXYCODONE CAUSING HALLUCINATIONS. SHE IS ALSO ALLERGIC TO PENICILLIN.   PHYSICAL EXAMINATION:  VITAL SIGNS: Blood pressure 204/99, respiratory rate 20, pulse 89, oxygen saturation 100%.  GENERAL APPEARANCE: Middle-aged female lying in bed in no acute distress.  HEAD AND NECK: No pallor. No icterus. No cyanosis. Ear examination revealed normal hearing, no discharge, no lesions. Examination of the nose showed no discharge, no lesions, no bleeding. Oropharyngeal examination revealed normal lips and tongue. She is wearing her dentures. Eye examination revealed normal eyelids and conjunctivae. Pupils about 4 to 5 mm, round, equal and reactive to light. Neck is supple. Trachea at midline. No thyromegaly. No cervical lymphadenopathy.   HEART: Normal S1, S2. No S3 or S4. No murmur. No gallop. No carotid bruits.  RESPIRATORY: Normal breathing pattern without use of accessory muscles. No rales. No wheezing.  ABDOMEN: Soft without tenderness. No hepatosplenomegaly. No masses. No hernias.  SKIN: No ulcers. No subcutaneous nodules.  MUSCULOSKELETAL: No joint swelling. No clubbing.  NEUROLOGIC: There is no facial asymmetry. She has normal eye movements. Tongue is central. She has slurred speech. She has left-sided weakness. Left arm is about 3/5 strength. Left leg is about 3/5 compared to the right leg. Plantar responses withdrawing on the right and 0 on the left; that is no upgoing or downgoing.  PSYCHIATRIC: The patient is alert and oriented x3. Mood and affect were normal.   LABORATORY FINDINGS: CAT scan of the head revealed lucency in the posterior limb of right internal capsule suggesting a lacunar infarct. There are multiple periventricular lucencies in the brainstem and cerebellum consistent with chronic ischemia. Her EKG showed normal sinus rhythm at rate of 98 per minute. Poor progression of R waves in the anterior chest leads. Nonspecific T wave abnormalities. Serum glucose 84,  BUN 19, creatinine 2.01, sodium 141, potassium 4.9. Estimated GFR 31. Albumin 2.9. Bilirubin 0.4, AST 55, ALT 26. Troponin less than 0.02. CBC showed white count of 6000, hemoglobin 10, hematocrit 31, platelet count 198. Prothrombin time 12. INR 0.9. Urinalysis negative.   ASSESSMENT:  1. Acute left-sided weakness associated with slurred speech and blurring of vision consistent with embolic stroke.  2. Severe uncontrolled systemic hypertension, partly secondary to noncompliance with medications.  3. Chronic kidney disease stage III.  4. Chronic diastolic congestive heart failure with ejection fraction of 55% to 60% by echocardiogram on 05/29/2012.  5. Her other medical problems include mild pulmonary hypertension, history of stroke with left hemiparesis, fibromyalgia and history of cocaine and smoking but none recently according to the patient.   PLAN: Will admit the patient to telemetry monitoring with frequent neurologic examinations and followup. Increase aspirin to 325 mg a day. Blood pressure control with intravenous labetalol, a small dose at 10 mg p.r.n., but avoid excessive blood pressure drop, only if systolic is above 517. Order carotid ultrasound. I did not order echocardiogram since it was just done recently. Neurology consultation. I will send urine for drug screen just to make sure there is no cocaine abuse recently. I will change simvastatin to atorvastatin for more potent statin treatment. For deep venous thrombosis prophylaxis, will place her on TED stockings and compression.   Time spent in evaluating this patient took more than 1 hour.    ____________________________ Clovis Pu.  Lenore Manner, MD amd:gb D: 08/09/2012 23:25:20 ET T: 08/10/2012 00:17:40 ET JOB#: [id number]  cc: Clovis Pu. Lenore Manner, MD, <Dictator> Ellin Saba MD ELECTRONICALLY SIGNED 08/10/2012 4:58

## 2014-05-17 NOTE — Discharge Summary (Signed)
PATIENT NAME:  Jessica Duran, Jessica Duran MR#:  [id number] DATE OF BIRTH:  August 07, 1955  DATE OF ADMISSION:  06/26/2012 DATE OF DISCHARGE:  06/29/2012  DISCHARGE DIAGNOSES: 1.  Elevated troponin, likely due to supply demand ischemia.  No myocardial infarction.   2.  Acute on chronic diastolic heart failure with ejection fraction of 55% to 60% by echocardiogram done on 05/29/2012.  The patient's amlodipine has been stopped due to peripheral edema which is a likely side effect of calcium channel blocker.  Her clonidine has been stopped for dry mouth and has been started on hydralazine and Isordil for better blood pressure control.  Also avoiding hydrochlorothiazide and ACE inhibitor or ARB due to renal dysfunction.  3.  Severe systemic hypertension, slowly improving.  May need more blood pressure medication adjustment as an outpatient.  4.  Nausea and vomiting, improving.  Can go home on oral nausea medication which has been prescribed.  She is tolerating diet and has been well-hydrated.  5.  Acute on chronic kidney disease, stage 3, with a creatinine of 2.3 on discharge.  Her baseline creatinine is 1.8.  She will need outpatient nephrology follow-up.    6.  Cocaine abuse.  She was counseled.  7.  Peripheral edema, likely due to calcium channel blocker, now stopped and has been improving.   SECONDARY DIAGNOSES: 1.  Malignant hypertension.  2.  Chronic kidney disease stage 3.  3.  Diastolic heart failure.  4.  Mild pulmonary hypertension.   CONSULTATIONS:  Cardiology, Dr. Rockey Situ.   PROCEDURES AND RADIOLOGY STUDIES:  Chest x-ray on 06/25/2012 showed mild interstitial prominence, possible pulmonary vascular congestion.  No focal consolidation.   KUB on June 4th showed mild constipation.  No evidence of bowel obstruction or ileus.   MAJOR LABORATORY PANEL:  UA on admission was negative.   HISTORY AND SHORT HOSPITAL COURSE:  The patient is a 59 year old female with the above-mentioned medical problems, was  admitted for elevated troponin thought to be secondary to supply/demand ischemia.  Please see Dr. Zacarias Pontes dictated history and physical for further details.  The patient was also evaluated by cardiology Dr. Rockey Situ who made some recommendation for changing her blood pressure medication as amlodipine was thought to be causing some peripheral edema and her dry mouth was thought to be caused by clonidine which is a common side effect.  She was changed to hydralazine and isosorbide dinitrate and medications were slowly adjusted to get blood pressure under better controlled.  She had some nausea and vomiting which was slowly improving which was thought to be possibly one of the medication side effect or just a viral infection, but she has been tolerating diet fine.  She was feeling much better and was discharged home in stable condition on June 5th.    ON THE DATE OF DISCHARGE HER VITAL SIGNS ARE AS FOLLOWS:  Temperature 99, heart rate 88 per minute, respirations 16 per minute, blood pressure 154/74 mmHg.  She was saturating 98% on room air.   PERTINENT PHYSICAL EXAMINATION ON THE DATE OF DISCHARGE:  CARDIOVASCULAR:  S1, S2 normal.  No murmurs, rubs or gallops.  LUNGS:  Clear to auscultation bilaterally.  No wheezing, rales, rhonchi, or crepitation.  ABDOMEN:  Soft, benign.  NEUROLOGIC:  Nonfocal examination.  All other physical examination remained at baseline.   DISCHARGE MEDICATIONS: 1.  Simvastatin 20 mg by mouth at bedtime.  2.  Aspirin 81 mg by mouth daily.  3.  Isosorbide dinitrate 40 mg by mouth 3 times a  day.  4.  Gabapentin 300 mg by mouth at bedtime.    5.  Zofran 4 mg by mouth every 8 hours as needed.  6.  Zolpidem 5 mg by mouth at bedtime, 15 days as needed for insomnia.  7.  Hydralazine 100 mg by mouth 4 times a day.   DISCHARGE DIET:  Low sodium, low fat, low cholesterol, renal diet.   DISCHARGE ACTIVITY:  As tolerated.   DISCHARGE INSTRUCTIONS AND FOLLOW-UP:  The patient was  instructed to follow up with her primary care physician, Dr. Melina Copa in [place], [place] in 1 to 2 weeks.  She will need follow-up with Dr. Rockey Situ from [facility] in 2 to 4 weeks and Dr. Candiss Norse from nephrology in 4 to 6 weeks.   TOTAL TIME DISCHARGING THIS PATIENT:  Was 55 minutes.   Please note, that patient was requesting narcotic in the form of Percocet and I refused to fill that as she states her outpatient doctor has been prescribing the same.  She does not have any obvious evidence of acute pain.  At this point she has been told that she could get a refill on her medication on her follow-up appointment with her family doctor.     ____________________________ Lucina Mellow. Manuella Ghazi, MD vss:ea D: 06/29/2012 17:28:32 ET T: 06/29/2012 23:02:57 ET JOB#: [id number]  cc: Christell Steinmiller S. Manuella Ghazi, MD, <Dictator> Dr. Melina Copa in [place] Minna Merritts, MD Dr. Su Monks [facility] MD ELECTRONICALLY SIGNED 07/03/2012 10:24

## 2014-05-17 NOTE — H&P (Signed)
PATIENT NAME:  Jessica Duran, Jessica Duran MR#:  [id number] DATE OF BIRTH:  Jul 05, 1955  DATE OF ADMISSION:  05/27/2012  PRIMARY CARE PHYSICIAN: Was at [facility], but she is looking to get her medical care here.   CHIEF COMPLAINT: "I think I had a stroke."   HISTORY OF PRESENT ILLNESS: This is a 59 year old female who states that she thinks she had a stroke last night, similar symptoms to a previous stroke. Normally she does have left-sided weakness. She states that she had left-sided pain, real sharp in nature, going down her entire left side, arm and leg down into her foot, lasting 5 to 6 seconds, and then an aching feeling that has been continuous. Still very numb in her left fourth and fifth toe and numbness on the left side. Her blood pressure is elevated. She has not been on medications in months. She does complain of some chest pain that she had last night and aching feeling all night. No shortness of breath. No nausea or vomiting. Positive for sweating. Nothing made her chest pain better or worse. Nothing relieved the pain. In the ER, she was found to have a very elevated blood pressure on presentation, 229/134. Hospitalist services were contacted for further evaluation.   PAST MEDICAL HISTORY: CVA with left-sided weakness, bladder problem, hypertension, diabetes, fibromyalgia, depression.   PAST SURGICAL HISTORY: Three C-sections, appendectomy.   ALLERGIES: PENICILLIN.   MEDICATIONS: None.   SOCIAL HISTORY: Occasionally smokes a cigarette. She drinks beer once per month. She does crack cocaine, last was two months ago. She lives with her boyfriend.   FAMILY HISTORY: Father died at 89 of a stroke, had hypertension. Mother died of pancreatic cancer.   REVIEW OF SYSTEMS:    CONSTITUTIONAL: Positive for fever. Positive for sweats. Positive for fatigue. Positive for left-sided weakness.  EYES: She has worsening vision. She does wear glasses.  EARS, NOSE, MOUTH AND THROAT: Decreased hearing in the left ear.  Positive for runny nose. Positive for dysphagia to liquids and solids. No sore throat.  CARDIOVASCULAR: Positive for chest pain.  RESPIRATORY: Positive for shortness of breath. No cough. No sputum. No hemoptysis.  GASTROINTESTINAL: Positive for abdominal pain. Positive for constipation. No bright red blood per rectum. No melena.  GENITOURINARY: No burning on urination. No hematuria.  MUSCULOSKELETAL: Positive for joint pain.  INTEGUMENTARY: Positive for itching on the chest and elbows.  PSYCHIATRIC: Positive for anxiety, depression. Positive for suicidal ideation.  ENDOCRINE: No thyroid problems.  HEMATOLOGIC AND LYMPHATIC: No anemia. No easy bruising or bleeding.   PHYSICAL EXAMINATION:  VITAL SIGNS: Temperature 98.2, pulse 84, respirations 20, blood pressure 229/134, pulse oximetry 99% on room air.  GENERAL: No respiratory distress.  EYES: Conjunctivae and lids normal. Pupils equal, round and reactive to light. Extraocular muscles intact. No nystagmus.  EARS, NOSE, MOUTH AND THROAT: Tympanic membranes: No erythema. Nasal mucosa: No erythema. Throat: No erythema. No exudate seen. Lips and gums: No lesions.  NECK: No JVD. No bruits. No lymphadenopathy. No thyromegaly. No thyroid nodules palpated.  LUNGS: Clear to auscultation. No use of accessory muscles to breathe. No rhonchi, rales or wheeze heard.  CARDIOVASCULAR: S1, S2 normal, II/VI systolic ejection murmur. Carotid upstroke 2+ bilaterally. No bruits. Dorsalis pedis pulses 2+ bilaterally. No edema of the lower extremities.  ABDOMEN: Soft. Positive tenderness throughout the lower abdomen. No organomegaly/splenomegaly. Normoactive bowel sounds. No masses felt.  LYMPHATIC: No lymph nodes in the neck.  MUSCULOSKELETAL: Trace edema of the lower extremities. No cyanosis. No clubbing.  SKIN:  No ulcers or lesions seen.  NEUROLOGICAL: Cranial nerves II through XII grossly intact. Deep tendon reflexes 1+ bilateral lower extremities. Power 4/5 on  the left upper extremity, 3+/5 on the left lower extremity, 5/5 on the right upper and lower extremity.  PSYCHIATRIC: The patient is alert and oriented x 3.   LABORATORY AND RADIOLOGICAL DATA: CT scan of the head showed lacunar infarcts in the basal ganglia, age undetermined. Small infarcts also may be present in the cerebellum and brainstem. Chest x-ray shows borderline cardiomegaly. White blood cell count 5.8, hemoglobin and hematocrit 12.1 and 36.1, platelet count 175. Glucose 86, BUN 19, creatinine 0.83, sodium 145, potassium 3.8, chloride 114, CO2 of 25, calcium 8.3. Liver function tests: AST slightly elevated at 53. Troponin borderline at 0.06.   ASSESSMENT AND PLAN:  1.  Hypertensive encephalopathy versus cerebrovascular accident: Aspirin given stat and will continue that on a daily basis with her history of stroke in the past. The patient has malignant hypertension at this point. Her blood pressure has probably been high for a long period of time. Will probably need multiple medications to control it. Goal blood pressure for today 190/100. Will start with clonidine 0.2 mg stat and 0.1 mg twice daily, nitro paste, low-dose metoprolol and as-needed intravenous labetalol. Titrate medications as needed. Will check a lipid profile and start a statin. Physical therapy, occupational therapy, speech therapy. MRI of the brain, echocardiogram and carotid ultrasound ordered.  2.  Elevated troponin, chest pain and abnormal EKG: EKG shows normal sinus rhythm, 78 beats per minute, left atrial enlargement, Q waves septally and flipped T waves laterally. Likely this is all secondary to hypertensive encephalopathy. Will give aspirin, nitro paste and metoprolol. The patient has already received intravenous labetalol in the Emergency Room. Will check serial cardiac enzymes and monitor on telemetry.  3.  Abdominal pain and constipation: Will give MiraLax on a daily basis.  4.  Suicidal ideation: We will get a  psychiatric consultation and put on one-to-one.  5.  Acute renal failure versus chronic kidney disease: This is likely chronic kidney disease secondary to the patient's long-standing hypertension, but I will give a liter of fluid and check the creatinine in the morning. 6.  Tobacco abuse: Smoking cessation counseling done 3 minutes by me. No need for a nicotine patch.   TIME SPENT ON ADMISSION: 55 minutes.   CODE STATUS: The patient wishes to be a DO NOT RESUSCITATE.   ____________________________ Tana Conch. Leslye Peer, MD rjw:jm D: 05/27/2012 17:16:37 ET T: 05/27/2012 17:51:29 ET JOB#: [id number]  cc: Tana Conch. Leslye Peer, MD, <Dictator> Marisue Brooklyn MD ELECTRONICALLY SIGNED 05/29/2012 21:28

## 2014-05-17 NOTE — Consult Note (Signed)
PATIENT NAME:  Jessica Duran, Jessica Duran MR#:  [id number] DATE OF BIRTH:  August 10, 1955  DATE OF CONSULTATION:  08/10/2012  REFERRING PHYSICIAN:  Dr. Lenore Manner CONSULTING PHYSICIAN:  Hemang K. Manuella Ghazi, MD  PRIMARY CARE PHYSICIAN:  Dr. Melina Copa in [place], [place].  REASON FOR CONSULTATION:  Recurrent left-sided uncontrollable movement on antihypertensive medications.   HISTORY OF PRESENT ILLNESS:  [name] is a 59 year old African American female who had her first clinical stroke in June of 2012, caused her to have left-sided weakness upper and lower extremity and now has a residual spastic left hemiparesis.   The patient also has chronic systemic hypertension, diastolic congestive heart failure, etc.  She was admitted on 06/26/2012, and discharged on 06/29/2012, with antihypertensive medication, but when she took the medication she had some unusual movements of her left arm and legs and slurred speech, which got better when she stopped taking her antihypertensive medications.    Two weeks went by and the patient started again the antihypertensive medication as she was having headaches.   But she took two antihypertensive medications at the same time and felt like she was having worsening weakness and uncontrollable movement of her left hand and possible tremors.   PAST MEDICAL HISTORY:  Significant for severe systemic hypertension, chronic diastolic congestive heart failure, chronic kidney disease stage III, mild pulmonary hypertension, fibromyalgia, history of stroke with left spastic hemiparesis.   PAST SURGICAL HISTORY:  Significant for appendectomy and C-section x 3.   FAMILY HISTORY:  Significant for a stroke in the father at age of 8, hypertension.  Mother had pancreatic cancer.   SOCIAL HISTORY:  Significant that she is widowed.  She lives with her cousin.    Her social history is also positive for the use of cocaine that she mentioned she has been "clean for the last 4-5 months."    She  does drink occasional alcohol.   She smokes occasionally.   REVIEW OF SYSTEMS:  Positive for left-sided body weakness and pain and blurriness of the vision, occasional headaches when her blood pressure goes up.   ALLERGIES:  HER ALLERGIES WERE TO OXYCODONE CAUSING HALLUCINATIONS AS WELL AS ALLERGIC TO PENICILLIN.   PHYSICAL EXAMINATION: VITAL SIGNS:  Temperature was 98.9, pulse 73, respiratory rate 17, blood pressure 167/69, pulse oximetry 98%.  GENERAL:  She is a middle-aged [place] American female lying in bed eating when I visited her room.  CARDIORESPIRATORY:  Her lungs were clear to auscultation.  She had a strong S2 heard sound.  NECK:  Her carotid exam did not reveal any bruit.  MENTAL STATUS:  She was alert, oriented.  She was able to follow two-step noted commands.  She thought that this is 08/09/2012 (got the date wrong as well as the day).  She thought that this is Wednesday.  Currently this is Thursday.    She was able to follow two-step commands.   I do not think that she had a neurological neglect.  Her attention and concentration seems to be appropriate.   She thought that this is " [facility]  CRANIAL NERVES:  Her pupils are equal, round and reactive.  She had some cataract related changes.  Her extraocular movements were intact.  Her face was symmetric.  Tongue was midline.  Facial sensations were intact.   On her motor exam she has a significant spasticity of her left upper and lower extremity which is quite painful to passive movement.   Her strength was around 3 out of 5.  She also has a significant loss of prosody of the movement on that side.    Her right side seems to be 5/5.   Her deep tendon reflexes were 3+ in her bilateral biceps, triceps and brachii radialis.  She has a positive Hoffman sign.   She has clonus on her left ankle.  She also has +3 knee jerks bilaterally with adductor spasms.   I did not make her walk.   ASSESSMENT AND  PLAN:   1.  Previous stroke.  Actually the patient has multiple lacunar stroke which are very clear on her T2-weighted images.  This had old strokes involving her bilateral cerebellum, bilateral thalamus, two strokes in putamen and tectum and one in tegmentum.  She also has a stroke in her left caudate region as well as in her basal ganglia region.  The patient also has extensive white matter microvascular ischemic changes in her corona radiata.  Fortunately, the patient does not seem to have any new stroke at this visit with negative DWI.  The patient might have a pressure dependent ischemia when she takes antihypertensive medications so we should very gradually reduce her blood pressure rather than try to reduce it drastically.  Her cerebral blood flow regulation curve which is auto-regulated and might have shifted to the right so a higher pressure is what her brain is used to and might take a while for the curve to shift to the left.  So she will need very gradual lowering of her blood pressure in a long period of time which I explained to the patient.  2.  In terms of her spasticity, I started her on baclofen 5 mg twice a day.  After one week it can be increased to 10 mg twice a day.  The patient seems to understand the dependent ischemia phenomenon and she is in agreement to work with her physician in lowering blood pressure slowly, but gradually.  She can even work with a hypertension specialist in the community.  Advised the patient to stay off of cocaine or any other sympathomimetic agent, which can make her hypertension worse.  I also talked to the patient about other stroke risk factors.  I agree with the use of aspirin as well as statin.    Her numbers actually look really good.   The patient should continue to have telemetry monitoring.  I do not think that there is a need to repeat echocardiogram.  Her ultrasound looked okay.   I will follow this patient on an as needed basis.  Feel  free to contact me with any further questions.    ____________________________ Royetta Crochet. Manuella Ghazi, MD hks:ea D: 08/10/2012 19:18:06 ET T: 08/10/2012 21:02:45 ET JOB#: [id number]  cc: Hemang K. Manuella Ghazi, MD, <Dictator> Royetta Crochet [facility] MD ELECTRONICALLY SIGNED 08/21/2012 7:44

## 2014-05-17 NOTE — Consult Note (Signed)
General Aspect 59 year old [place] American female with a history of stroke and resultant left hemiparesis, history of malignant hypertension, chronic kidney disease, cocaine, admitted on May 3rd 2014, discharged on May 5th with headache and elevated blood pressure, now with increased peripheral edema affecting the upper and lower extremities (tight and painful), dry mouth.  Since her previous discharge, she has had significant arm and leg edema. She wonders if it could be from one of her medications. Significant dry mouth as well as swelling.  She denies having any chest pain. No shortness of breath. No palpitations. No syncope.   In the Emergency Department  creatinine  was 1.8. Again,  her troponin has climbed on third check 0.4  echocardiogram on May 5th last month and that showed ejection fraction of 55% to 60%. Evidence of decreased relaxation consistent with diastolic dysfunction. Moderate tricuspid regurgitation and mild pulmonary hypertension.   Present Illness . PAST SURGICAL HISTORY: Appendectomy and C-section x3.   FAMILY HISTORY: Her father suffered from stroke and died at the age of 61. He also suffered from hypertension. Her mother died of complications of pancreatic cancer.   SOCIAL HISTORY: She is widowed. Now lives with her boyfriend.   SOCIAL HABITS: She smokes cigarettes but only occasional. Occasionally, may use cocaine, last time was more than 2 months ago. She drinks alcohol once in a while.   Physical Exam:  GEN well developed, well nourished, no acute distress     CVA/Stroke:    Diabetes Mellitus, Type II (NIDD):    Hypertension:        Admit Diagnosis:   790.6 ELEVATED TROPONIN EDEMA: Onset Date: 26-Jun-2012, Status: Active, Description: 790.6 ELEVATED TROPONIN EDEMA  Home Medications: Medication Instructions Status  hydrALAZINE 50 mg oral tablet 1 tab(s) orally 2 times a day Active  cloNIDine 0.1 mg oral tablet 1 tab(s) orally 2 times a day Active   simvastatin 20 mg oral tablet 1 tab(s) orally once a day (at bedtime) Active  aspirin 81 mg oral delayed release tablet 1 tab(s) orally once a day Active  amLODIPine 10 mg oral tablet 1 tab(s) orally once a day Active  gabapentin 300 mg oral capsule 1 cap(s) orally once a day (at bedtime) Active   Lab Results:  Routine Chem:  02-Jun-14 05:07   Result Comment TROPONIN - RESULTS VERIFIED BY REPEAT TESTING.  - ELEVATED TROPONIN PREVIOUSLY CALLED AT  - 2139 06/25/12.PMH  Result(s) reported on 26 Jun 2012 at 05:54AM.  Glucose, Serum  150  BUN  23  Creatinine (comp)  2.11  Sodium, Serum 140  Potassium, Serum 3.9  Chloride, Serum  110  CO2, Serum 23  Calcium (Total), Serum  8.4  Anion Gap 7  Osmolality (calc) 286  eGFR (African American)  29  eGFR (Non-African American)  25 (eGFR values <66m/min/1.73 m2 may be an indication of chronic kidney disease (CKD). Calculated eGFR is useful in patients with stable renal function. The eGFR calculation will not be reliable in acutely ill patients when serum creatinine is changing rapidly. It is not useful in  patients on dialysis. The eGFR calculation may not be applicable to patients at the low and high extremes of body sizes, pregnant women, and vegetarians.)  Cardiac:  01-Jun-14 19:58   Troponin I  0.16 (0.00-0.05 0.05 ng/mL or less: NEGATIVE  Repeat testing in 3-6 hrs  if clinically indicated. >0.05 ng/mL: POTENTIAL  MYOCARDIAL INJURY. Repeat  testing in 3-6 hrs if  clinically indicated. NOTE: An increase or decrease  of 30% or more on serial  testing suggests a  clinically important change)  02-Jun-14 05:07   Troponin I  0.24 (0.00-0.05 0.05 ng/mL or less: NEGATIVE  Repeat testing in 3-6 hrs  if clinically indicated. >0.05 ng/mL: POTENTIAL  MYOCARDIAL INJURY. Repeat  testing in 3-6 hrs if  clinically indicated. NOTE: An increase or decrease  of 30% or more on serial  testing suggests a  clinically important change)     12:02   Troponin I  0.41 (0.00-0.05 0.05 ng/mL or less: NEGATIVE  Repeat testing in 3-6 hrs  if clinically indicated. >0.05 ng/mL: POTENTIAL  MYOCARDIAL INJURY. Repeat  testing in 3-6 hrs if  clinically indicated. NOTE: An increase or decrease  of 30% or more on serial  testing suggests a  clinically important change)   EKG:  Interpretation EKG shows NSR with ST and T wave diffuse (seen previously)   Radiology Results: XRay:    01-Jun-14 22:38, Chest PA and Lateral  Chest PA and Lateral   REASON FOR EXAM:    shortness of breath  COMMENTS:       PROCEDURE: DXR - DXR CHEST PA (OR AP) AND LATERAL  - Jun 25 2012 10:38PM     RESULT: Comparison is made to prior study dated 06/20/2012.    Findings: The patient has taken a shallow inspiration.The cardiac   silhouette is enlarged. There is mild prominence of the interstitial   markings. This finding is accentuated by the shallow inspiration. No   focal regions of consolidation or focal infiltrates are appreciated.    IMPRESSION:      1. Shallow inspiration.  2. Mild interstitial prominence which may represent a component of   pulmonary vascular congestion. Otherwise, there are no focal regions of   consolidation or focal infiltrates.    Thank you for the opportunity to contribute to the care of your patient.         Verified By: Mikki Santee, M.D., MD    Oxycodone: Hallucinations  Penicillin: Unknown  Vital Signs/Nurse's Notes: **Vital Signs.:   02-Jun-14 16:23  Vital Signs Type Routine  Temperature Temperature (F) 98  Celsius 36.6  Temperature Source oral  Pulse Pulse 69  Respirations Respirations 16  Systolic BP Systolic BP 563  Diastolic BP (mmHg) Diastolic BP (mmHg) 62  Mean BP 86  Pulse Ox % Pulse Ox % 95  Pulse Ox Activity Level  At rest  Oxygen Delivery Room Air/ 21 %    Impression 59 year old [place] American female American female with a history of stroke and resultant left hemiparesis, history of malignant  hypertension, chronic kidney disease, cocaine, admitted on May 3rd 2014, discharged on May 5th with headache and elevated blood pressure, now with increased peripheral edema affecting the upper and lower extremities (tight and painful), dry mouth.  1) Elevated cardiac enz: Likely demand ischemia in the setting of moderate LVH, cardiac stretch. Currently with no sx. No further testing needed.  2) Arm swelling, leg swelling Suspect from Ca channel blocker side effects Would hold amlodipine  3) Dry mouth Suspect from clonidine side effects She would like to hold this medication  4) HTN: Would continue hydralazine 500 mg TID with isosorbide dinitrate 20 mg TID Hold Ca channel blocker, clonidine,  B-blocker held before for cocaine use In setting of renal dysfunction, would avoid HCTZ, ACE, ARB   Electronic Signatures: Ida Rogue (MD)  (Signed 02-Jun-14 17:43)  Authored: General Aspect/Present Illness, History and Physical Exam, Past Medical History, Health Issues,  Home Medications, Labs, EKG , Radiology, Allergies, Vital Signs/Nurse's Notes, Impression/Plan   Last Updated: 02-Jun-14 17:43 by Ida Rogue (MD)

## 2014-05-17 NOTE — H&P (Signed)
PATIENT NAME:  Jessica Duran, HOLLINGSHEAD MR#:  [id number] DATE OF BIRTH:  January 26, 1955  DATE OF ADMISSION:  06/25/2012  PRIMARY CARE PHYSICIAN: Dr. Melina Copa in [place], [place].   REFERRING PHYSICIAN: Ferman Hamming.   CHIEF COMPLAINT: Increased peripheral edema affecting 4 limbs. Incidental finding of elevated troponin.   HISTORY OF PRESENT ILLNESS: Jessica Duran is a 59 year old African American female with a history of stroke and resultant left hemiparesis, history of malignant hypertension, chronic kidney disease. Last time admitted here was last month on May 3rd, discharged on May 5th with headache and elevated blood pressure. The patient presented now with increased peripheral edema affecting the upper and lower extremities to the extent that she feels it is becoming tight and painful. She denies having any chest pain. No shortness of breath. No palpitations. No syncope. Evaluation in the Emergency Department reveals evidence of elevated troponin reaching 0.16. Her baseline troponin a month ago was 0.06, even though at that time her creatinine was elevated. It was 2.06 and 2.46; however, her creatinine now is lower at 1.8. Again, her troponin a month ago was normal at 0.06. The patient is admitted for 24-hour observation to reduce the volume overload and to follow up on her cardiac enzymes. It is worth to mention that the patient had echocardiogram on May 5th last month and that showed ejection fraction of 55% to 60%. Evidence of decreased relaxation consistent with diastolic dysfunction. Moderate tricuspid regurgitation and mild pulmonary hypertension.   REVIEW OF SYSTEMS:   CONSTITUTIONAL: Denies any fever. No chills. No excessive fatigue.  EYES: No blurring of vision. No double vision.  ENT: No hearing impairment. No sore throat. No dysphagia.  CARDIOVASCULAR: No chest pain. No shortness of breath, but she has increased peripheral edema. No syncope.  RESPIRATORY: No cough. No sputum production. No  chest pain. No hemoptysis.  GASTROINTESTINAL: No abdominal pain. No vomiting. No diarrhea.  GENITOURINARY: No dysuria. No frequency of urination.  MUSCULOSKELETAL: No joint pain or swelling. No muscular pain or swelling other than her fibromyalgia.  NEUROLOGY: No focal weakness in reference to any new finding; however, she has residual left hemiparesis affecting left arm and left leg. No seizure. No headache.  PSYCHIATRY: No anxiety. No depression.  ENDOCRINE: No polyuria or polydipsia. No heat or cold intolerance.   PAST MEDICAL HISTORY: Admitted last month with malignant hypertension associated with headache, chronic kidney disease stage III, diastolic congestive heart failure with ejection fraction of 55% to 60% by echocardiogram done on 05/29/2012, moderate tricuspid regurgitation and mild pulmonary hypertension. Tobacco abuse and history of occasional use of cocaine, last time was more than 2 months ago. History of stroke with left hemiparesis affecting left arm and left leg and fibromyalgia.   PAST SURGICAL HISTORY: Appendectomy and C-section x3.   FAMILY HISTORY: Her father suffered from stroke and died at the age of 52. He also suffered from hypertension. Her mother died of complications of pancreatic cancer.   SOCIAL HISTORY: She is widowed. Now lives with her boyfriend.   SOCIAL HABITS: She smokes cigarettes but only occasional. Occasionally, may use cocaine, last time was more than 2 months ago. She drinks alcohol once in a while.   ADMISSION MEDICATIONS: Simvastatin 20 mg once a day, clonidine 0.1 mg twice a day, aspirin 81 mg a day, amlodipine 10 mg a day, hydralazine 25 mg taking 2 tablets, that is 50 mg, twice a day. Of note, when she was discharged a month ago, hydralazine dose upon discharge was 75  mg twice a day. Gabapentin 300 mg at bedtime.   ALLERGIES: OXYCODONE CAUSING HALLUCINATIONS AND PENICILLIN.   PHYSICAL EXAMINATION:  VITAL SIGNS: Blood pressure 157/81, respiratory  rate 18, pulse 92, temperature 98.6, oxygen saturation 97%.  GENERAL APPEARANCE: This is a middle-aged female lying in bed in no acute distress.  HEAD AND NECK: No pallor. No icterus. No cyanosis. Ear examination revealed normal hearing, no discharge, no ulcers. Examination of the nose showed no discharge, no bleeding, no ulcers. Oropharyngeal examination revealed normal lips and tongue. She is edentulous, wearing dentures only on the upper part and no none on the lower jaw. Eye examination revealed normal eyelids and conjunctivae. Pupils were constricted. Could not see reactivity to light. Neck is supple. Trachea at midline. No thyromegaly. No cervical lymphadenopathy. No masses.  HEART: Normal S1, S2. No S3 or S4. No murmur. No gallop. No carotid bruits.  RESPIRATORY: Normal breathing pattern without use of accessory muscles. No rales. No wheezing.  ABDOMEN: Soft without tenderness. No hepatosplenomegaly. No masses. No hernias.  SKIN: No ulcers. No subcutaneous nodules.  EXTREMITIES: She has peripheral edema +2 around the ankles. The hands are puffy as well.  NEUROLOGIC: Cranial nerves II through XII were intact. She has residual left hemiparesis. The strength in the left arm and left leg is about 3/5.  PSYCHIATRIC: The patient is alert and oriented x3. Mood and affect were normal.   LABORATORY FINDINGS: Chest x-ray showed cardiomegaly. No evidence of effusion or consolidation. EKG showed normal sinus rhythm at rate of 80 per minute. Probable old anteroseptal MI. Evidence of left ventricular hypertrophy. ST-T wave abnormalities in the lateral leads. These are unchanged compared to her old EKG done on May 27th and also on May 3rd. Her serum glucose 72. B-type natriuretic peptide was elevated at 3717. BUN 23, creatinine 1.8. At baseline creatinine on May 4th was 2.06 and on May 27th was 2.4. Serum sodium 142, potassium 4. Her estimated GFR is 35. Calcium 8.4, albumin 2.4, AST 41, ALT 28, bilirubin 0.2.  Troponin is elevated at 0.16. Her baseline troponin last month was 0.04 to 0.06. CBC showed white count of 5000, hemoglobin 11, hematocrit 33, platelet count 192. Urinalysis showed unremarkable findings.   ASSESSMENT:  1. Elevated troponin. The patient has no symptoms of chest pain or shortness of breath.  2. Increased peripheral edema.  3. Acute on chronic diastolic congestive heart failure with ejection fraction of 55% to 60% by echocardiogram done on 05/29/2012.  4. Severe systemic hypertension. Now, it is moderately controlled.  5. Chronic kidney disease stage III.  6. History of stroke with resultant left hemiparesis.  7. Cocaine abuse, although the patient tells me that the last time she used cocaine was more than 2 months ago; however, a month ago on May 3rd, her drug screen was positive for cocaine.   PLAN: Will admit the patient for observation on telemetry. Follow up on cardiac enzymes. I will increase aspirin from 81 mg to 325 mg. I will add beta blocker using metoprolol 25 mg twice a day. Intravenous Lasix 40 mg twice a day x3 doses to reduce the volume overload. Monitor basic metabolic profile. I will check urine for drug screen to ensure she is not abusing cocaine again. May consider reducing the dose of amlodipine to 5 mg and discharge her on beta blocker and small dose of oral diuretic using Lasix. The patient needs to quit smoking and quit using cocaine.   Time spent in evaluating this patient took  more than 55 minutes, including reviewing her medical records.    ____________________________ Clovis Pu. Lenore Manner, MD amd:gb D: 06/25/2012 23:53:39 ET T: 06/26/2012 00:38:09 ET JOB#: [id number]  cc: Clovis Pu. Lenore Manner, MD, <Dictator> Ellin Saba MD ELECTRONICALLY SIGNED 06/26/2012 23:11

## 2014-05-17 NOTE — Consult Note (Signed)
PATIENT NAME:  Jessica Duran, Jessica Duran MR#:  [id number] DATE OF BIRTH:  04/10/1955  AGE:  59 years  SEX:  Female  RACE:  African-American  DATE OF CONSULTATION:  05/28/2012  PLACE OF DICTATION:  [place], [place], [place], room [address]255.  CONSULTING PHYSICIAN:  Mourad Cwikla K. Sholom Dulude, MD  REPORT OF CONSULTATION:  The patient is a 59 year old African-American female, not employed and is on SSI for multiple physical problems. She is widowed for 47 years and lives with her boyfriend, who is 52 years old.  The patient comes to [facility] with a chief complaint "My blood pressure is high and needs to be checked out."  The patient was seen in consultation for hypertensive encephalopathy, uncontrolled hypertension, possible CVA and left-sided weakness, diabetes, fibromyalgia and depression.  The patient does admit feeling depressed, feeling hopeless and helpless, and reports that when Dr. Bobbye Charleston asked her if she is depressed, she told him that in the past she even had suicidal ideas.   PAST PSYCHIATRIC HISTORY:  The patient reports that she was inpatient for 30 days at [facility] when she talked of suicide and was depressed. She was released, and was given follow up appointment, but she could not go for the follow up appointment because she lives in [place] and it is hard to get a ride.  ALCOHOL AND DRUGS:  Admits that she has an occasional beer. Admits that she used to smoke crack cocaine, and quit smoking for many years. Started smoking crack cocaine, last smoking 2 days ago.   MENTAL STATUS:  The patient is dressed in hospital clothes. Alert and oriented. Appears irritable and frustrated, talking about her problems. She does admit feeling hopeless and helpless, but denies feeling worthless and useless. Does admit to suicidal wishes at times, but absolutely denies any plan for suicide, and contracts for safety. Denies any ideas of plans to hurt herself or others, and she wants to get help. She feels frustrated  when she does not get help for depression. She stays depressed, and this makes her feel frustrated. No psychosis. Cognition is intact. Insight and judgment guarded. Contracts for safety.  IMPRESSIONS:  1.  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   2.  Major depressive disorder, recurrent, with history of suicidal ideas, but currently contracts for safety.  RECOMMEND:  Start patient on Cymbalta 60 mg p.o. every day to help her for depression, and this is helpful for fibromyalgia also.  Discontinue one-on-one observation, as patient contracts for safety.    ____________________________ Wallace Cullens. Franchot Mimes, MD skc:mr D: 05/28/2012 21:32:58 ET T: 05/28/2012 23:00:04 ET JOB#: [id number]  cc: Arlyn Leak K. Franchot Mimes, MD, <Dictator> Dewain Penning MD ELECTRONICALLY SIGNED 06/03/2012 21:59

## 2014-05-18 NOTE — Consult Note (Signed)
PATIENT NAME:  Jessica Duran, Jessica Duran MR#:  [id number] DATE OF BIRTH:  December 30, 1955  DATE OF CONSULTATION:  11/29/2013  REFERRING PHYSICIAN:  Mamie Levers, MD  CONSULTING PHYSICIAN:  Kingson Lohmeyer R. Ma Hillock, MD  REASON FOR CONSULTATION: The patient with breast cancer on chemotherapy, admitted with right IJ DVT.   HISTORY OF PRESENT ILLNESS: The patient is a 59 year old female patient with a history of multiple medical problems including a history of breast cancer on adjuvant chemotherapy, currently on weekly Taxol treatment; end-stage renal disease on hemodialysis; CVA x 3, in 1997, 2004, and 2013; hypertension; history of UTI; who has been admitted with progressive swelling in the right upper extremity. Venous Doppler shows deep venous thrombosis from the right IJ extending up to superior vena cava. The patient has a PermCath catheter in the right upper chest and Port-A-Cath in the left upper chest. She denies any left upper extremity swelling or pain. She denies any new shortness of breath, chest pain, or hemoptysis. No bleeding issues. She has been started on IV heparin and on Coumadin.   PAST MEDICAL AND SURGICAL HISTORY: As in HPI above.   FAMILY HISTORY: Noncontributory.   SOCIAL HISTORY: Denies current smoking or alcohol usage. Chronic weakness and fatigue, and tries to remain physically active.   ALLERGIES: INCLUDE OXYCODONE, TYLENOL, LEVAQUIN, HYDRALAZINE, PERCOCET, ISOSORBIDE, LISINOPRIL, PENICILLIN.   HOME MEDICATIONS: 1.  Amlodipine 10 mg daily.  2.  Carvedilol 6.25 mg b.i.d.  3.  Clonidine 0.1 mg b.i.d.  4.  Dexamethasone prior to Taxol chemotherapy.  5.  Famotidine 20 mg daily.  6.  Lasix 40 mg once daily p.r.n. for swelling.  7.  Multivitamin 1 tablet daily.  8.  Compazine p.r.n. for nausea. 9.  Rena-Vite. 10.  Vitamin B complex 1 daily. 11.  Tramadol 50 mg q. 6 hours p.r.n. for pain.   REVIEW OF SYSTEMS:  CONSTITUTIONAL: Generalized weakness and fatigue. No fever or chills.   HEENT: Denies any headaches or dizziness at rest. No epistaxis, ear or jaw pain. CARDIAC: Dyspnea on exertion. No angina, palpitation, orthopnea.  LUNGS: As in HPI.  GASTROINTESTINAL: No nausea, vomiting, or diarrhea. No blood in stools or melena.  GENITOURINARY: No dysuria.  HEMATOLOGIC: Denies bleeding symptoms.  EXTREMITIES: Right upper extremity swelling and discomfort.  NEUROLOGIC: No new focal weakness, seizures or loss of consciousness.  ENDOCRINE: No polyuria or polydipsia. Appetite is fairly steady.   PHYSICAL EXAMINATION:  GENERAL: The patient is weak and tired looking, resting in bed and getting dialysis at this time. Otherwise awake and oriented and converses appropriately. No acute distress. Pallor present.  VITAL SIGNS: Temperature 98.2, heart rate 99, blood pressure 143/82, saturation 98% on room air.  HEENT: Normocephalic, atraumatic. Extraocular movements intact. Sclerae anicteric.  NECK: Shows swelling in the right lower area along with right upper extremity swelling.  CARDIOVASCULAR: S1, S2, regular rate and rhythm.  LUNGS: Show bilateral good air entry, decreased at bases, no rhonchi noted.  ABDOMEN: Soft, nontender.  EXTREMITIES: Show mild pedal edema. Has right upper extremity edema.   LABORATORY RESULTS: Creatinine 3.33, serum iron 28, iron saturation 19%, TIBC low at 148, calcium 8.2, WBC 4800, hemoglobin 7.7, platelets 165,000,  neutrophils 3.0. INR 1.2.   IMPRESSION AND RECOMMENDATIONS: A 59 year old female patient with a history of multiple medical problems as described above along with a history of breast cancer status post resection and now on adjuvant chemotherapy currently getting weekly Taxol treatments, who has Port-A-Cath in the left upper chest for intravenous access for  chemotherapy, and PermCath in the right upper chest for dialysis access and has been admitted with right upper extremity swelling and found to have right internal jugular deep venous  thrombosis. Otherwise, clinically seems to be doing steady, no bleeding issues. Agree with ongoing plan to try anticoagulation to see if symptoms of deep vein thrombosis respond. She does not have any symptoms on the left upper extremity despite having Port-A-Cath. Blood count shows chronic progressive anemia which is likely from renal disease and also chemotherapy side effect. Recommend avoiding erythropoietin given that she is on chemotherapy for breast cancer, continue supportive transfusions as indicated by labs and symptoms. If deep vein thrombosis persists or progresses despite anticoagulation, then may need to consider removal of central venous catheters. If patient is discharged soon, she was advised to keep appointments at [facility] same as already scheduled. She is agreeable to this plan.   Thank you for the referral. Please feel free to contact me for additional questions.    ____________________________ Rhett Bannister Ma Hillock, MD srp:ST D: 11/30/2013 00:00:39 ET T: 11/30/2013 00:39:07 ET JOB#: [id number]  cc: Leonda Cristo R. Ma Hillock, MD, <Dictator> Alveta Heimlich MD ELECTRONICALLY SIGNED 12/04/2013 23:56

## 2014-05-18 NOTE — Discharge Summary (Signed)
PATIENT NAME:  Jessica Duran, Jessica Duran MR#:  [id number] DATE OF BIRTH:  1955-02-24  DATE OF ADMISSION:  11/17/2013 DATE OF DISCHARGE:  11/21/2013  PROCEDURE: Hemodialysis.   CONSULTATIONS: Nephrology, Dr. Murlean Iba; Dr. Leia Alf, oncology.  CHIEF COMPLAINT AT TIME OF ADMISSION: Intractable nausea, vomiting, and diarrhea.  ADMITTING DIAGNOSES: 1.  Intractable nausea, vomiting, and diarrhea, most probably from chemotherapy.  2.  Gastroenteritis with some abdominal pain, rule out Clostridium difficile.  3.  End-stage renal disease, on hemodialysis.  4.  History of breast cancer. Follows up with Dr. Leia Alf.  5.  Hypertension.   DISCHARGE DIAGNOSES: 1.  Intractable nausea, vomiting, and diarrhea, probably chemotherapy related, resolved.  2.  Neutropenia from chemotherapy, resolved.  3.  End-stage renal disease. Continue hemodialysis on Tuesday, Thursday, and Saturday.  4.  History of breast cancer. Follow up with Dr. Leia Alf as an outpatient.  5.  Hypertension, stable.   BRIEF HISTORY AND PHYSICAL: The patient is a 59 year old African American female who came into the ED on October 24th with the chief complaint of intractable nausea, vomiting, and diarrhea associated with abdominal pain. Please review history and physical dictated by Dr. Vianne Bulls. The patient's chemotherapy was Monday prior to the admission. Following chemotherapy for her breast cancer she started feeling weak and unable to take p.o. for approximately 1 week. The ED physician has called and discussed with Dr. Leia Alf who has recommended to admit the patient to hospital. In the ED, the patient was started on gentle IV fluids in view of end-stage renal disease. She was given 1 liter of IV fluids and Zofran was given for antiemesis.   HOSPITAL COURSE: Gentle hydration was continued as the patient was still nauseated and vomiting. Stool for C. diff toxin was ordered, but the patient's diarrhea was resolved in  the interim. Abdominal pain was also significantly improved. She was provided with antiemetic medication around the clock and pain management was provided with IV morphine for abdominal pain. Her blood pressure was stable during the hospital course. Eventually nausea, vomiting and diarrhea significantly improved. The patient was started on clear liquid diet and diet was advanced. Abdominal pain was resolved with pain management.   Neutropenia, which was thought to be chemo induced, was significantly improved. Neutropenia precautions were discontinued as the neutropenia was resolved.   The patient was evaluated by nephrology for end-stage renal disease and she had hemodialysis done x1 on Thursday. Her neutropenia and intractable nausea, vomiting, and diarrhea seemed to be from chemotherapy, which was significantly resolved. Condition was stable and the decision was made to discharge the patient home.   DISCHARGE CONDITION: Stable. Vital signs are stable.   DIAGNOSTIC DATA: On October 28th, her Accu-Chek was at 132. On 27th of October the patient's glucose was 139, BUN 24, creatinine 3.16. Sodium and potassium were normal. Chloride 116. CO2 22. GFR 19. Lipase was 150. Troponin 0.03. White count was 1.3 on October 24th, but on 27th of October it went up to 3.2 and on 28th of October white count was at 6.1, which was in the normal range. Hemoglobin 8.6, hematocrit 26.1, and platelets are 131,000.   Blood culture has revealed no growth. Wound culture has revealed light growth of Staphylococcus lugdunensis, rare growth of Prevotella species. Gram stain has revealed no organisms and the culture comment was normal skin flora. There were reporting this as normal skin flora, no antibiotics were provided to the patient, and blood culture has revealed no growth.   DISCHARGE  MEDICATIONS: Coreg 6.25 mg 1 tablet p.o. b.i.d., amlodipine 10 mg once daily in the a.m., clonidine 0.1 mg p.o. twice a day, prochlorperazine 10  mg 1 tablet p.o. 3 times a day as needed for nausea and vomiting, tramadol 50 mg p.o. q. 6 hours as needed for pain, furosemide 40 mg 1 tablet p.o. once a day as needed for leg swelling, multivitamin 1 dose once a day, Rena-Vite vitamin B complex with C and folic acid 1 tablet p.o. once daily, dexamethasone 4 mg 2 tablets p.o. at 10:00 p.m. prior to chemotherapy as recommended, famotidine 20 mg p.o. once daily, Phenadoz 12.5 mg rectal suppository 1 suppository every 6 hours as needed for nausea and vomiting.   DISCHARGE DIET: Carb controlled, renal diet.   DISCHARGE ACTIVITY: As tolerated.   DISCHARGE FOLLOWUP: Follow up with primary care physician in a week, nephrology in 1 to 2 weeks, and Dr. Leia Alf, oncology, in 2 weeks or as scheduled. Continue hemodialysis on Tuesday, Thursday, and Saturday. The patient was recommended to follow up with outpatient diabetic clinic in 1 week. Plan of care was discussed in detail with the patient. She is aware of the plan.   TOTAL TIME SPENT: 45 minutes. ____________________________ Nicholes Mango, MD ag:sb D: 11/27/2013 10:50:00 ET T: 11/27/2013 11:27:15 ET JOB#: [id number]  cc: Nicholes Mango, MD, <Dictator> Murlean Iba, MD Sandeep R. Ma Hillock, MD PCP Nicholes Mango MD ELECTRONICALLY SIGNED 12/03/2013 21:51

## 2014-05-18 NOTE — H&P (Signed)
PATIENT NAME:  Jessica Duran, Jessica Duran MR#:  [id number] DATE OF BIRTH:  Dec 05, 1955  DATE OF ADMISSION:  01/15/2014  REFERRING PHYSICIAN:  Gregor Hams, M.D.   PRIMARY CARE PHYSICIAN: Rogue Jury, M.D.  ADMISSION DIAGNOSIS: Possible cerebrovascular accident and dysarthria.   HISTORY OF PRESENT ILLNESS: This is a 59 year old African American female who presents to the Emergency Department complaining of worsening headache for 2 days. The patient also noted approximately 10 hours prior to admission, that she began speaking differently. She says that she feels like she has a thick tongue and it is hard to find words when she wants to speak. 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 She denies any motor weakness with this, but admits only to headache and dysarthria. She also denies chest pain. Due to her high risk of repeat incident, and current symptoms, the Emergency Department called for admission.   PAST MEDICAL HISTORY:  End-stage renal disease on dialysis, hypertension, DVT in the right upper extremity, breast cancer under her current treatment (last treatment 1 week ago) and CVA x2.   PAST SURGICAL HISTORY: Three C-sections, appendectomy, vascular catheter placement and port placement.   SOCIAL HISTORY: The patient does not smoke or drink. She admits to a history of drug abuse approximately 5 years ago.   FAMILY HISTORY: Her father is deceased of stroke. Her aunt is deceased of complications from diabetes.   MEDICATIONS:  1. Amlodipine 10 mg 1 tablet p.o. every morning.  2. Apixaban 2.5 mg 1 tab p.o. b.i.d. 3. Carvedilol 6.25 mg 1 tab p.o. b.i.d.  4. Klonopin 0.5 mg 1 tab p.o. b.i.d.  5. Dexamethasone 4 mg 2 tablets p.o. at bedtime the night before each Taxol chemotherapy treatment.  6. Docusate sodium 100 mg 1 tab p.o. b.i.d.  7. Famotidine 20 mg 1 tab p.o. daily.  8. Fluticasone 2 sprays to each nostril once a day.  9. Furosemide 40 mg 1 tab  p.o. daily.  10. Magnesium hydroxide 8% oral suspension 30 mL p.o. t.i.d. 11. Multivitamin 1 tab p.o. daily.  12. Ondansetron 4 mg 1 tablet p.o. every 6 hours as needed for nausea or vomiting.  13. Prochlorperazine 10 mg 1 tablet p.o. t.i.d. as needed for nausea or vomiting.  14. Promethazine 12.5 mg 1 tablet p.o. every 6 hours rectally as needed for nausea or vomiting (all of these antiemetics are not current).  15. Rena-Vite vitamin B complex with folic acid 1 tablet p.o. daily.  16. Tramadol 50 mg 1 tab p.o. 6 hours as needed for pain.   ALLERGIES:  HYDRALAZINE, ISOSORBIDE DINITRATE, LEVAQUIN, LISINOPRIL, OXYCODONE, PERCOCET AND PENICILLIN AS WELL AS TYLENOL.   PERTINENT LABORATORY RESULTS AND RADIOGRAPHIC FINDINGS: Serum glucose 280, BUN 33, creatinine 3.22, sodium is 139, potassium 3.7, chloride is 108, bicarbonate 22, calcium is 7.4. Phosphorus is 3.5, serum albumin is 2.5. White blood cell count 2300, hemoglobin 8.7, hematocrit is 27, platelet count is 155,000. CT of the head without contrast shows no acute intracranial findings, but some chronic cortical volume loss and areas of periventricular white matter hypodensities that indicate small vessel change. The patient also has a CSF density in the bilateral basal ganglia which represents remote lacunar infarcts.   PHYSICAL EXAMINATION:  VITAL SIGNS: Temperature is 98.5, pulse 83, respirations 20, blood pressure 172/89. Pulse oximetry is 99% on room air.  GENERAL: The patient is alert and oriented x3, in no apparent distress.  HEENT: Normocephalic, atraumatic. Pupils equal, round,  and reactive to light and accommodation. Extraocular movements are intact. Mucous membranes are moist. The patient's tongue seems to be thick and hypotonic but has normal range of motion, indicating normal cranial nerve function.  NECK: Trachea is midline. No adenopathy.  CHEST: Symmetric, atraumatic, port is in place on the left. Vas-Cath is in place with a clean  dressing on the right.  CARDIOVASCULAR: Regular rate and rhythm. No rubs, clicks, or murmurs appreciated.  LUNGS: Clear to auscultation bilaterally. Normal effort and excursion.  ABDOMEN: Positive bowel sounds, soft, nontender, nondistended. No hepatosplenomegaly.  GENITOURINARY: Deferred.  EXTREMITIES: No clubbing, cyanosis, or edema.  SKIN: No rashes or lesions.  MUSCULOSKELETAL: The patient has 5/5 strength on the right upper and lower extremities, but has a contracture of the left upper extremity and only a 2/5 strength on the lower extremity from her old stroke. The patient states this finding is not new.  NEUROLOGIC: Cranial nerves 2 through 12 are grossly intact. The patient's speech is clearly impaired which she states is new. There are no deficits in visual fields.  PSYCHIATRIC: Mood is normal. Affect is congruent.   ASSESSMENT AND PLAN: This is a 59 year old female admitted for presumed cerebrovascular accident. The patient has new onset dysarthria despite a negative CT scan, which is concerning for a cerebral ischemic event.  1. Cerebrovascular accident.  I have ordered neurochecks every 4 hours and will allow permissive hypertension. However, I have ordered labetalol for systolic blood pressures more than 220. I have also placed a neurologic consult which will likely obtain MRI at 24 hours status post onset of symptoms.  2. End-stage renal disease. The patient is a Tuesday, Thursday, Saturday dialysis patient. She is due to for hemodialysis today and we will arrange to place her on the schedule if  this is available. Notably, the patient's creatinine and potassium are not in dangerous levels at this time. 3. Leukopenia. This is likely secondary to the patient's chemotherapy for breast cancer. Her absolute neutrophil count is more than 1200 at this time. Due to her overall health status and state of immunocompromise, we may want to place her on neutropenic precautions; however, it is not  obligated right at this moment. The patient has no signs or symptoms of sepsis.  4. Current deep vein thrombosis. The patient states that she has a right upper extremity clot. She is currently on at apixaban, which we will continue. She has not had any shortness of breath at this time nor is she tachycardic, thus my suspicion for pulmonary embolism or subsequent  paradoxical embolus to the brain is very low. We will continue her apixaban and not start heparin at this time due to any risk for hemorrhagic conversion. We will consider more rapid systemic anticoagulation once the MRI results are obtained. 5. Gastrointestinal prophylaxis. None as the patient is not critically ill at this time. 6. The patient is a full code.   TIME SPENT ON ADMISSION ORDERS AND PATIENT CARE: Approximately 40 minutes.      ____________________________ Norva Riffle. Marcille Blanco, MD msd:kl D: 01/15/2014 17:57:37 ET T: 01/15/2014 18:19:48 ET JOB#: [id number]  cc: Norva Riffle. Marcille Blanco, MD, <Dictator> Norva Riffle Dmitry Macomber MD ELECTRONICALLY SIGNED 01/15/2014 23:57

## 2014-05-18 NOTE — Consult Note (Signed)
ONCOLOGY followup  note -  HPI: feels tired, intermittent nausea. No fevers. ROS: mild abdominal pain. No dysuria. No bleeding issues. Exam: tired-looking, otherwise alert adn oriented, NAD.             vitals - 99, 81, 18, 159/92, 96% on room air             lungs - b/l good air entry             abd - soft, mild tenderness in upper abdomen. No guarding oro rigidity. Labs: WBC 1400, ANC 200, Hb 8.3, platelets 100K. Impression/Recommendations:  Breast cancer on chemotherapy. Admitted with nausea/vomiting secondary to chemotherapy - continue supportive care, hydration and anti-emetics. Continues to have severe neutropenia.  Given frail condition and co-morbidities including ESRD on HD, will start on G-CSF support if ANC < 1200. DIarrhea is better. Given ongoing acute issues and neutropenia, will hold chemotherapy this week and re-evaluate next week. Patient explained above, she is agreeable to this plan. Will continue to follow as indicated.  Electronic Signatures: Jonn Shingles (MD)  (Signed on 26-Oct-15 23:14)  Authored  Last Updated: 26-Oct-15 23:14 by Jonn Shingles (MD)

## 2014-05-18 NOTE — Consult Note (Signed)
Reason for Visit: This 59 year old Female patient presents to the clinic for initial evaluation of  breast cancer .   Referred by Dr Ma Hillock.  Diagnosis:  Chief Complaint/Diagnosis   59 year old female with stage I (T1 C. 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 Also has a history of hypertension. She presented with anabnormal mammogram of the right breast showing increased density in the upper-outer quadrant with suspicious pleomorphic calcifications spanning 7 mm.underwent core biopsy positive for ductal carcinoma in situ. She then underwent wide local excision with at least 7 cm of ductal carcinoma in situ seen. The wasn't region of 1.5 cm invasive mammary carcinoma triple negative with margins clear but close at 1 mm. DCIS margin was clear at 1 mm area again tip tumor was triple negative. Based on the fact surgeon thought this was all ductal carcinoma in situ no sentinel lymph node was performed. According to surgeon would be difficult to evaluate the axilla with sentinel node biopsy based on the recent surgery and scar ultrasound was performed showing a 1.8 cm normal appearing lymph node in the axilla. She has done well postoperatively. She's been seen by medical oncology and is planned for adjuvant chemotherapy. I been asked to evaluate the patient for adjuvant radiation  therapy. She specifically denies breast tenderness cough or bone pain.  Past Hx:    Breast Cancer:    CVA/Stroke:    Diabetes Mellitus, Type II (NIDD):    Hypertension:    Excision right breast mass: May 2015   Appendectomy:   Past, Family and Social History:  Past Medical History positive   Cardiovascular hypertension   Genitourinary UTIs   Endocrine diabetes mellitus   Neurological/Psychiatric CVA; CVA x3   Past Surgical History appendectomy   Family History positive   Family History Comments mother with pancreatic cancer   Social History positive   Social History Comments crack cocaine addiction as described above does occasionally smoke cigarettes has quit alcohol and recreational drug use.   Additional Past Medical and Surgical History seen by herself tonight   Allergies:   Oxycodone: Hallucinations  Percocet 10/325: Hallucinations  Tylenol: Headaches  Hydralazine: Other  Isosorbide Dinitrate: Other  Lisinopril: Other  Penicillin: Other  Home Meds:  Home Medications: Medication Instructions Status  carvedilol 6.25 mg oral tablet 1 tab(s) orally 2 times a day Active  amLODIPine 10 mg oral tablet 1 tab(s) orally once a day (in the morning) Active  cloNIDine 0.1 mg oral tablet 1 tab(s) orally 2 times a day Active  calcitriol 0.5 mcg oral capsule 1 cap(s) orally 3 times a week Active  Aleve sodium 220 mg oral tablet 1 tab(s) orally every 8 hours, As Needed Active   Review of Systems:  General negative   Performance Status (ECOG) 0   Skin negative   Breast see HPI   Ophthalmologic negative   ENMT negative   Respiratory and Thorax negative  Cardiovascular negative   Gastrointestinal negative   Genitourinary negative   Musculoskeletal negative   Neurological negative   Psychiatric negative   Hematology/Lymphatics negative   Endocrine negative   Allergic/Immunologic negative   Review of Systems   denies any weight loss,  fatigue, weakness, fever, chills or night sweats. Patient denies any loss of vision, blurred vision. Patient denies any ringing  of the ears or hearing loss. No irregular heartbeat. Patient denies heart murmur or history of fainting. Patient denies any chest pain or pain radiating to her upper extremities. Patient denies any shortness of breath, difficulty breathing at night, cough or hemoptysis. Patient denies any swelling in the lower legs. Patient denies any nausea vomiting, vomiting of blood, or coffee ground material in the vomitus. Patient denies any stomach pain. Patient states has had normal bowel movements no significant constipation or diarrhea. Patient denies any dysuria, hematuria or significant nocturia. Patient denies any problems walking, swelling in the joints or loss of balance. Patient denies any skin changes, loss of hair or loss of weight. Patient denies any excessive worrying or anxiety or significant depression. Patient denies any problems with insomnia. Patient denies excessive thirst, polyuria, polydipsia. Patient denies any swollen glands, patient denies easy bruising or easy bleeding. Patient denies any recent infections, allergies or URI. Patient "s visual fields have not changed significantly in recent time.   Physical Exam:  General/Skin/HEENT:  General normal   Skin normal   Eyes normal   ENMT normal   Head and Neck normal   Additional PE well-developed female in NAD. Lungs are clear to A&P cardiac examination shows regular rate and rhythm. Right breast is a wide local excision scar which is healed well both breasts are pendulous. No axillary or supraclavicular adenopathy is detected. No dominant mass or nodularity is noted in either breast in 2 positions examined.   Breasts/Resp/CV/GI/GU:  Respiratory and Thorax normal   Cardiovascular normal   Gastrointestinal normal   Genitourinary normal   MS/Neuro/Psych/Lymph:  Musculoskeletal normal   Neurological  normal   Lymphatics normal   Other Results:  Radiology Results: LabUnknown:    18-Feb-15 15:19, Digital Diagnostic Mammogram Bilateral  PACS [place]:  Digital Diagnostic Mammogram Bilateral   REASON FOR EXAM:    RT BR MASS AND NIPPLE DISCHARGE AND YRLY  COMMENTS:       PROCEDURE: MAM - MAM DGTL DIAGNOSTIC MAMMO W/CAD  - Mar 14 2013  3:19PM     CLINICAL DATA:  The patient gives a history of right breast  spontaneous bloody nipple discharge.    EXAM:  DIGITAL DIAGNOSTIC  bilateral MAMMOGRAM WITH CAD    ULTRASOUND right BREAST    COMPARISON:  None  ACR Breast Density Category c: The breast tissue is heterogeneously  dense, which may obscure small masses.    FINDINGS:  There is diffusely increased density noted within the upper outer  quadrant of the right breast when compared with the left side. Also,  there is mild diffusely increased density present within the  subareolar portion of the right breast when compared with the left  side. There is a small group of suspicious pleomorphic  calcifications located within the upper outer quadrant of the right  breast with linear forms present. These calcifications span 7 mm.  These are worrisome for DCIS.    Mammographic images were processed with CAD.  On physical exam, there is thickening present within the upper outer  quadrant of the right breast located at  the 10 o'clock position  approximately 4-8 cm from the nipple. No nipple discharge could be  expressed at this time.    Ultrasound is performed, showing numerous dilated ducts with  intraductal echogenic material most likely representing intraductal  tumor/masses. 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 Also,  the ductal dilatation extends to the level of the nipple.I  recommend ultrasound-guided core needle biopsy of this area. I have  discussed this with the patient. This will be scheduled.    The right axilla was not evaluated at the time of this  ultrasound  and should be evaluated prior to ultrasound-guided core biopsy.   IMPRESSION:  1. Diffusely dilated ducts with intraductal soft tissue masses  located within the upper outer quadrant right breast at 10 o'clock  position with palpable thickening in this region. There are, in  addition, worrisome calcifications in this region. 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 No nipple discharge could be  expressed at this time.    RECOMMENDATION:  1. Right breast ultrasound-guided core biopsy.  2. Ultrasound of the right axilla prior to the patient's  ultrasound-guided core biopsy.    I have discussed the findings and recommendations with the patient.  Results were also provided in writing at the conclusion of the  visit. If applicable, a reminder letter will be sent to the patient  regarding the next appointment.    BI-RADS CATEGORY  4: Suspicious abnormality - biopsy should be  considered.      Electronically Signed    By: Luberta Robertson M.D.    On: 03/14/2013 16:02         Verified By: Lucy Antigua, M.D.,   Relevent Results:   Relevant Scans and Labs mammogram and ultrasound reviewed   Assessment and Plan: Impression:   stage I invasive mammary carcinoma in 59 year old female no lymph node sampling ultrasound of axilla is negative to receive adjuvant chemotherapy. Plan:   at this time I believe we can forego axillary dissection and proceed with adjuvant chemotherapy followed by whole breast and peripheral lymphatic radiation. Based on the fact by ultrasound her axillary nodes are negative would also treat her peripheral fax to 5000 cGy. Would also boost her scar another 1600 cGy using electron beam. Risks and benefits of treatment including skin reaction, fatigue, alteration of blood counts and possibility of lymphedema of her right upper extremity were all discussed in detail.  I've expressed to her that she needs to be exercising her right upper extremity while she is undergoing radiation and afterwards to prevent any possibility of lymphedema. I will reevaluate the patient after completion of systemic chemotherapy. I have discussed the case personally with medical oncology.  I would like to take this opportunity for allowing me to participate in the care of your patient..  CC Referral:  cc: Dr. Rogue Jury   Electronic Signatures: Baruch Gouty, Roda Shutters (MD)  (Signed 29-May-15 12:41)  Authored: HPI, Diagnosis, Past Hx, PFSH, Allergies, Home Meds, ROS, Physical Exam, Other Results, Relevent Results, Encounter Assessment and Plan, CC Referring Physician   Last Updated: 29-May-15 12:41 by Armstead Peaks (MD)

## 2014-05-18 NOTE — Op Note (Signed)
PATIENT NAME:  Jessica Duran, Jessica Duran MR#:  [id number] DATE OF BIRTH:  [birth year]  DATE OF PROCEDURE:  05/25/2013  PREOPERATIVE DIAGNOSIS: Right breast mass.   POSTOPERATIVE DIAGNOSIS: Right breast mass.   PROCEDURE: Right partial mastectomy.   ANESTHESIA: General.   INDICATIONS: This 58 year old female has history of bleeding from the right nipple. Also has had soreness and firmness in her right breast. Mammograms demonstrated diffuse area of increased density in upper outer quadrant of right breast and also in the retroareolar area. Ultrasound demonstrated diffusely dilated ducts and intraductal soft tissue masses. Ultrasound-guided core biopsy demonstrated atypical ductal proliferation and associated sclerosis and hemorrhage. Excision of the mass was recommended for definitive treatment. It is noted that at the time of physical exam in the office, on 04/11/2013, the mass was approximately 4 to 5 cm, however, at the time of preop evaluation, on the day of surgery, the mass appeared to be more like 7 cm. It appeared that it had increased in size during the interval and excision of the mass involved a significant part of the breast and therefore described this as a right partial mastectomy.   DESCRIPTION OF PROCEDURE: With the patient on the operating table, in the supine position, under general anesthesia, the right arm was placed on a lateral arm support. There was some bloody nipple discharge appreciated and this was cleaned. The breast was prepared with ChloraPrep and draped in a sterile manner.  The mass was easily palpable in the upper outer quadrant, some 7 cm in dimension. A curvilinear incision was made approximately 6 cm from the nipple, from the 9 o'clock to 12 o'clock position and did remove the skin ellipse, which was approximately 1.5 cm wide. The skin ellipse was removed separately and placed in the same container with the breast mass. The dissection was carried down through the subcutaneous  tissues using electrocautery. One traversing large vein was divided between 4-0 chromic suture ligatures. The dissection was carried down into the breast surrounding the mass. The mass itself was firm. There was much edema in the operative area, and with finger palpation the direction of the dissection was determined and excised the mass. There were a number of dilated ducts noted towards the central aspect of the breast, as these were divided in the course of removing the mass. The mass was labeled with the margin maps to mark the skin cranial, caudal, medial, lateral, and deep margins and was submitted fresh for routine pathology. The wound was inspected. A number of small bleeding points were cauterized. This was observed for some 10 minutes, blotting, and eventually determined hemostasis was intact. The tissues surrounding cautery artifact in multiple areas were infiltrated with 0.5% Sensorcaine with epinephrine, and also the subcutaneous tissues were infiltrated as well. Next, for closure, the subcutaneous tissues were approximated with interrupted 4-0 chromic and then the skin was closed with a running 4-0 Monocryl subcuticular suture. The suture line was some 12 cm in length. The wound was then dressed with Dermabond and allowed to dry.  The patient tolerated surgery satisfactorily and was then prepared for transfer to the recovery room.  ____________________________ Lenna Sciara. Rochel Brome, MD jws:sb D: 05/25/2013 11:26:04 ET T: 05/25/2013 11:48:01 ET JOB#: [id number]  cc: Loreli Dollar, MD, <Dictator> Loreli Dollar MD ELECTRONICALLY SIGNED 06/01/2013 12:50

## 2014-05-18 NOTE — Op Note (Signed)
PATIENT NAME:  Jessica Duran, Jessica Duran MR#:  [id number] DATE OF BIRTH:  1955/06/21  DATE OF PROCEDURE:  11/05/2013   PREOPERATIVE DIAGNOSES:  1.  End-stage renal disease.  2.  Poorly functioning left arm arteriovenous fistula with non-maturation.   POSTOPERATIVE DIAGNOSES: 1.  End-stage renal disease.  2.  Poorly functioning left arm arteriovenous fistula with non-maturation.   PROCEDURES:  1.  Ultrasound guidance for vascular access to left radiocephalic arteriovenous fistula.  2.  Left upper extremity fistulogram and central venogram.  3.  Percutaneous transluminal angioplasty with drug-coated 5 mm dilator Lutonix angioplasty balloon to peri-anastomotic stenosis.   SURGEON: Algernon Huxley, MD   ANESTHESIA: Local with moderate conscious sedation.   ESTIMATED BLOOD LOSS: Minimal.   INDICATION FOR PROCEDURE: This is a 59 year old female who has recently started on dialysis. She had a fistula placed a few months ago, but this is not mature and cannot be used for her dialysis access needs. She had to have a PermCath placed last week. She is brought in today for a fistulogram with possible intervention to improve the function of the fistula. Risks and benefits were discussed. Informed consent was obtained.   DESCRIPTION OF PROCEDURE: The patient is brought to the vascular suite. The left upper extremity was sterilely prepped and draped, and a sterile surgical field was created. The fistula was accessed just below the antecubital fossa in a retrograde fashion under direct ultrasound guidance without difficulty with a micropuncture needle, and a permanent image was recorded. A micropuncture wire and sheath were then placed. Imaging performed through the sheath showed the outflow in the upper arm to be dual, mostly through the brachial vein and the basilic vein. The central venous circulation appeared patent, and there may have been some mild narrowing around her Port-A-Cath in the left innominate vein, but  this did not appear to be too flow limiting. With a Kumpe catheter parked in the radial artery, an anastomosis was performed of the peri anastomotic region, and this showed a very high-grade stenosis, less than 2 cm from the radial artery in the peri anastomotic region. I replaced the Magic Torque wire. A 5 mm diameter x 6 cm length Lutonix drug-coated angioplasty balloon was inflated from the anastomosis back in the cephalic vein for several centimeters to encompass this lesion. A tight narrowing was seen with balloon inflation, which resolved at about 11-12 atmospheres. The inflation was held for approximately 1 minute. The balloon was then deflated. The Kumpe catheter was parked back at the arterial anastomosis. Completion angiogram was performed which showed markedly improved flow, with only about a 15-20% residual stenosis, which was not flow limiting.   At this point, I elected to terminate the procedure. The sheath was removed. A 4-0 Monocryl pursestring suture was placed. Pressure was held. Sterile dressing was placed. The patient tolerated the procedure well and was taken to the recovery room in stable condition.    ____________________________ Algernon Huxley, MD jsd:MT D: 11/05/2013 10:06:02 ET T: 11/05/2013 11:07:11 ET JOB#: [id number]  cc: Algernon Huxley, MD, <Dictator> Algernon Huxley MD ELECTRONICALLY SIGNED 11/10/2013 12:36

## 2014-05-18 NOTE — Consult Note (Signed)
CHIEF COMPLAINT and HISTORY:  Subjective/Chief Complaint neck swelling, weakness   History of Present Illness Patient is a 59 yo AAF with ESRD and breast cancer.  Was in hospital last week.  Readmitted today with acute neck swelling for about 24 hours.  She has been weak and felt poorly for past several weeks.  Has a left radiocephalic AVF which has not yet been used for dialysis, and appears marginally matured for use for dialysis clinically.  Has been using a right jugular Permcath which has been in about a month.  Also has a port on the left side for her chemo.  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  She has noticed swelling in right arm as well as neck.  No chest pain.  Catheter worked well for HD Saturday by report.   PAST MEDICAL/SURGICAL HISTORY:  Past Medical History:   CVA:    hyperparathyroidism:    Breast Cancer:    CVA/Stroke:    Diabetes Mellitus, Type II (NIDD):    Hypertension:    Excision right breast mass: May 2015   Appendectomy:   ALLERGIES:  Allergies:  Oxycodone: Hallucinations  Percocet 10/325: Hallucinations  Tylenol: Headaches  Levaquin: Pain  Penicillin: Other  Hydralazine: Other  Isosorbide Dinitrate: Other  Lisinopril: Other  HOME MEDICATIONS:  Home Medications: Medication Instructions Status  carvedilol 6.25 mg oral tablet 1 tab(s) orally 2 times a day Active  amLODIPine 10 mg oral tablet 1 tab(s) orally once a day (in the morning) Active  cloNIDine 0.1 mg oral tablet 1 tab(s) orally 2 times a day Active  prochlorperazine 10 mg oral tablet 1 tab(s) orally 3 times a day, As Needed - for Nausea, Vomiting Active  traMADol 50 mg oral tablet 1 tab(s) orally every 6 hours, As Needed - for Pain Active  furosemide 40 mg oral tablet 1 tab(s) orally once a day, As Needed for leg swelling Active  Rena-Vite Vitamin B Complex with C and Folic Acid oral tablet 1 tab(s) orally once a day Active  famotidine 20 mg oral tablet 1 tab(s) orally  once a day Active  dexamethasone 4 mg oral tablet 2 tab(s) orally once a day (at bedtime) the night before each Taxol chemotherapy Active  dexamethasone 4 mg oral tablet 2 tab(s) orally once a day (in the morning) of each Taxol chemotherapy Active  multivitamin 1 tab(s) orally once a day Active  promethazine 12.5 mg rectal suppository 1 suppository(ies) rectal every 6 hours, As Needed - for Nausea, Vomiting Active   Family and Social History:  Family History Non-Contributory   Social History positive  tobacco (Current within 1 year), negative ETOH, negative Illicit drugs   + Tobacco Current (within 1 year)   Place of Living Home   Review of Systems:  Subjective/Chief Complaint No TIA/stroke/seizure No heat or cold intolerance No dysuria/hematuria, is on HD but just started in last month. No blurry or double vision No tinnitus or ear pain No rashes or ulcer Arm and neck swelling per HPI Positive for right breast surgery for cancer   Fever/Chills Yes   Cough No   Sputum No   Abdominal Pain No   Diarrhea No   Constipation No   Nausea/Vomiting No   SOB/DOE No   Chest Pain No   Telemetry Reviewed NSR   Dysuria No   Tolerating PT No   Tolerating Diet Yes   Medications/Allergies Reviewed Medications/Allergies reviewed   Physical Exam:  GEN well developed, well nourished, obese, chronically ill  appearing, looks older than stated age   HEENT pink conjunctivae, moist oral mucosa   NECK right neck swelling present, catheter exits in right subclavicular region.  Mild tenderness right neck.  Port present on left side   RESP normal resp effort  no use of accessory muscles  rhonchi   CARD regular rate  no JVD   VASCULAR ACCESS AV fistula present  Good bruit  Good thrill  left radiocephalic AVF   ABD denies tenderness  soft  normal BS   GU no superpubic tenderness   LYMPH positive neck, positive axillae   EXTR negative cyanosis/clubbing, positive edema,  right arm with 2+ swelling   SKIN normal to palpation, skin turgor good   NEURO cranial nerves intact, motor/sensory function intact   PSYCH lethargic, not as alert as last time I saw her several weeks ago   LABS:  Laboratory Results: LabObservation:    02-Nov-15 12:15, [place] Color Flow Doppler Upper Extrem Right (Arm)  OBSERVATION   Reason for Test swelling R arm - dialysis cath - right upper chest  Hepatic:    02-Nov-15 10:38, Comprehensive Metabolic Panel  Bilirubin, Total 0.4  Alkaline Phosphatase 101  46-116  NOTE: New Reference Range  08/14/13  SGPT (ALT) 30  14-63  NOTE: New Reference Range  08/14/13  SGOT (AST) 30  Total Protein, Serum 6.2  Albumin, Serum 2.3  Routine Chem:  Glucose, Serum 150  BUN 17  Creatinine (comp) 3.41  Sodium, Serum 140  Potassium, Serum 3.3  Chloride, Serum 105  CO2, Serum 26  Calcium (Total), Serum 7.7  Osmolality (calc) 284  eGFR (African American) 18  eGFR (Non-African American) 15  eGFR values <59mL/min/1.73 m2 may be an indication of chronic  kidney disease (CKD).  Calculated eGFR, using the MRDR Study equation, is useful in   patients with stable renal function.  The eGFR calculation will not be reliable in acutely ill patients  when serum creatinine is changing rapidly. It is not useful in  patients on dialysis. The eGFR calculation may not be applicable  to patients at the low and high extremes of body sizes, pregnant  women, and vegetarians.  Anion Gap 9    02-Nov-15 10:38, Magnesium, Serum  Magnesium, Serum 1.6  1.8-2.4  THERAPEUTIC RANGE: 4-7 mg/dL  TOXIC: > 10 mg/dL   -----------------------    02-Nov-15 10:38, Troponin I  Result Comment   TROPONIN - RESULTS VERIFIED BY REPEAT TESTING.   - NOTIFIED SANDRA WEAVER ON 11/26/13 @ 1144   - OF ELEVATED RESULTS...qsd   - READ-BACK PROCESS PERFORMED.   Result(s) reported on 26 Nov 2013 at 11:26AM.  Cardiac:  Troponin I 0.08  0.00-0.05  0.05 ng/mL or less: NEGATIVE    Repeat testing in 3-6 hrs   if clinically indicated.  >0.05 ng/mL: POTENTIAL   MYOCARDIAL INJURY. Repeat   testing in 3-6 hrs if   clinically indicated.  NOTE: An increase or decrease   of 30% or more on serial   testing suggests a   clinically important change  Routine UA:    02-Nov-15 13:19, Urinalysis  Color (UA) Yellow  Clarity (UA) Hazy  Glucose (UA) 50 mg/dL  Bilirubin (UA) Negative  Ketones (UA) Negative  Specific Gravity (UA) 1.013  Blood (UA) Negative  pH (UA) 7.0  Protein (UA) >=500  Nitrite (UA) Negative  Leukocyte Esterase (UA) 1+  Result(s) reported on 26 Nov 2013 at 01:38PM.  RBC (UA) 14 /HPF  WBC (UA) 93 /HPF  Bacteria (  UA)   NONE SEEN  Epithelial Cells (UA) 1 /HPF  WBC Clump (UA) PRESENT  Result(s) reported on 26 Nov 2013 at 01:38PM.  Routine Coag:    02-Nov-15 10:38, Activated PTT  Activated PTT (APTT) 32.2  A HCT value >55% may artifactually increase the APTT. In one study,  the increase was an average of 19%.  Reference: "Effect on Routine and Special Coagulation Testing Values  of Citrate Anticoagulant Adjustment in Patients with High HCT Values."  American Journal of Clinical Pathology 2006;126:400-405.    02-Nov-15 10:38, Prothrombin Time  Prothrombin 13.7  INR 1.1  INR reference interval applies to patients on anticoagulant therapy.  A single INR therapeutic range for coumarins is not optimal for all  indications; however, the suggested range for most indications is  2.0 - 3.0.  Exceptions to the INR Reference Range may include: Prosthetic heart  valves, acute myocardial infarction, prevention of myocardial  infarction, and combinations of aspirin and anticoagulant. The need  for a higher or lower target INR must be assessed individually.  Reference: The Pharmacology and Management of the Vitamin K   antagonists: the seventh ACCP Conference on Antithrombotic and  Thrombolytic Therapy. ZYYQM.2500 Sept:126 (3suppl): [id number].  A HCT value  >55% may artifactually increase the PT.  In one study,   the increase was an average of 25%.  Reference:  "Effect on Routine and Special Coagulation Testing Values  of Citrate Anticoagulant Adjustment in Patients with High HCT Values."  American Journal of Clinical Pathology 2006;126:400-405.  Routine Hem:    02-Nov-15 10:38, CBC Profile  WBC (CBC) 8.5  RBC (CBC) 2.88  Hemoglobin (CBC) 8.6  Hematocrit (CBC) 26.6  Platelet Count (CBC) 129  MCV 92  MCH 29.7  MCHC 32.2  RDW 14.7  Neutrophil % 64.6  Lymphocyte % 15.4  Monocyte % 19.1  Eosinophil % 0.5  Basophil % 0.4  Neutrophil # 5.5  Lymphocyte # 1.3  Monocyte # 1.6  Eosinophil # 0.0  Basophil # 0.0  Result(s) reported on 26 Nov 2013 at 11:29AM.   RADIOLOGY:  Radiology Results: XRay:    26-May-15 10:51, Chest Portable Single View  Chest Portable Single View  REASON FOR EXAM:    check catheter placement  COMMENTS:       PROCEDURE: DXR - DXR PORTABLE CHEST SINGLE VIEW  - Jun 19 2013 10:51AM     CLINICAL DATA:  Status post port placement    EXAM:  PORTABLE CHEST - 1 VIEW    COMPARISON:  08/13/2012    FINDINGS:  A new left chest wall power port is noted. Catheter tip is noted in  the mid superior vena cava just short of the cavoatrial junction. No  pneumothorax is noted. The lungs are clear. Cardiac shadow is  stable. No bony abnormality is seen.     IMPRESSION:  Chest port placement as described.  No pneumothorax is noted.      Electronically Signed    By: Inez Catalina M.D.    On: 06/19/2013 11:06         Verified By: Everlene Farrier, M.D.,    24-Oct-15 13:57, Abdomen 3 Way Includes PA Chest  Abdomen 3 Way Includes PA Chest  REASON FOR EXAM:    abd pain, vomiting  COMMENTS:       PROCEDURE: DXR - DXR ABDOMEN 3-WAY (INCL PA CXR)  - Nov 17 2013  1:57PM     CLINICAL DATA:  Dialysis patient. Vomiting. On chemotherapy for  breast  cancer. History of hypertension diabetes.    EXAM:  ABDOMEN  SERIES    COMPARISON:  10/25/2013 chest radiograph and CT abdomen pelvis  07/12/2013.    FINDINGS:  Stable heart and mediastinal contours. Heart size appears borderline  enlarged. Right IJ dialysis catheter is present, with the distal  lumen in the mid right atrium and proximal lumen at the junction of  superior vena cava and right atrium, in satisfactory position. Left  jugular Port-A-Cath with tip projecting over the mid superior vena  cava, stable.    Lungs are well expanded and clear.    No evidence of free intraperitoneal air. Bowel gas pattern is  nonobstructive. Curvilinear calcifications projecting over the  midline pelvis correspond with calcified fibroids seen on prior CT  abdomen pelvis. There are also vascular calcifications in the  pelvis.   IMPRESSION:  1. No acute cardiopulmonary disease.  2. Satisfactory position of right IJ dialysis catheter and left IJ  Port-A-Cath.  3. Nonobstructive bowel gas pattern.      Electronically Signed    By: Britta Mccreedy M.D.  On: 11/17/2013 14:39         Verified By: Oliver Hum, M.D.,    [phone] 12:02, Chest Portable Single View  Chest Portable Single View  REASON FOR EXAM:    Sepsis  COMMENTS:       PROCEDURE: DXR - DXR PORTABLE CHEST SINGLE VIEW  - Nov 26 2013 12:02PM     CLINICAL DATA:  Right arm swelling with pain. Abdominal pain,  nausea, vomiting for past 2 days.    EXAM:  PORTABLE CHEST - 1 VIEW    COMPARISON:  10/25/2013    FINDINGS:  Right dialysis catheter is in place with tip in the upper right  atrium. Left Port-A-Cath is unchanged with the tip in the SVC. No  pneumothorax. Heart is borderline in size. Lungs are clear. No  effusions. No acute bony abnormality.     IMPRESSION:  No active cardiopulmonary disease.      Electronically Signed    By: Charlett Nose M.D.    On: 11/26/2013 12:28         Verified By: Cyndie Chime, M.D.,  Fluoroscopy:    26-May-15 10:07, C-Arm With Spot Images   C-Arm With Spot Images  REASON FOR EXAM:    Lead placement  COMMENTS:       PROCEDURE: DXR - DXR C-ARM WITH SPOT IMAGES  - Jun 19 2013 10:07AM     CLINICAL DATA:  Lead placement    EXAM:  DG C-ARM 61-120 MIN CHEST 1 VIEW    TECHNIQUE:  Single AP view of the chest is obtained with the C-arm.    :  COMPARISON:  None    FINDINGS:  Left jugular catheter tip in the SVC. Limited imaging of the  mediastinum is present.     IMPRESSION:  Catheter tip in the SVC. Chest x-ray recommended for further  evaluation.      Electronically Signed    By: Marlan Palau M.D.    On: 06/19/2013 10:09     Verified By: Camelia Phenes, M.D.,  [place]:    14-Nov-14 14:35, US Kidney Bilateral  US Kidney Bilateral  REASON FOR EXAM:    chronic kidney disease stage IV  COMMENTS:       PROCEDURE: KUS - KUS KIDNEYS  - Dec 08 2012  2:35PM     CLINICAL DATA:  Chronic, stage IV, kidney disease.  EXAM:  RENAL/URINARY TRACT ULTRASOUND COMPLETE    COMPARISON:  None.    FINDINGS:  Right Kidney  Length: 10.1 cm. There is increased renal parenchymal echogenicity.  There are renal cysts. The largest lies at the midpole measuring 3.2  cm. There is a cyst with a thin septation arising from the mid to  lower pole measuring 2.2 cm in greatest dimension. No  hydronephrosis.    Left Kidney    Length: 10.1 cm. There is diffusely increased renal parenchymal  echogenicity. Small cyst from the mid to upper pole medially  measuring 12 mm with thin internal echoes suggesting septae. No  hydronephrosis.    Bladder  Bladder is only mildly distended. No bladder mass, stone or wall  thickening is evident.     IMPRESSION:  1. Increased renal parenchymal echogenicity consistent with medical  renal disease. No hydronephrosis  2. Renal cysts. On the right, there is a cyst with a single  septation consistent with a Bosniak category 2 cyst. On the left  there is a smaller, mildly complex cyst medially that has  some  internal echoes likely due to septa. Recommend followup ultrasound  in 6 months to document stability.      Electronically Signed    By: Lajean Manes M.D.    On: 12/08/2012 15:25         Verified By: Lasandra Beech, M.D.,    22-May-15 14:39, [place] Non Vascular Extremity Right  [place] Non Vascular Extremity Right  REASON FOR EXAM:    e val axillary  nodes  status post recent lumpectomy  COMMENTS:       PROCEDURE: KUS - KUS EXTREMITY NON VASCULAR RIGHT  - Jun 15 2013  2:39PM     CLINICAL DATA:  Evaluate right axillary nodes after lumpectomy.    EXAM:  [place] EXTREMNON VASC*R* COMPLETE    TECHNIQUE:  Focused ultrasound of the right axilla was performed to evaluate for  lymphadenopathy.    COMPARISON:  None.  FINDINGS:  No mass lesion or soft tissue distortion is seen within the axillary  fat. The largest axillary node measures 1.8 x 0.6 x 0.8 cm, and has  a smooth, homogeneous cortical mantle measuring up to 2 mm  thickness. The fatty hilum and hilar vascular structures are  preserved.     IMPRESSION:  Negative for right axillary lymphadenopathy.      Electronically Signed    By: Jorje Guild M.D.    On: 06/15/2013 16:36     Verified By: Gilford Silvius, M.D.,    08-Jun-15 16:29, US Kidney Bilateral  US Kidney Bilateral  REASON FOR EXAM:    chronic kidney disease stage IV  hypertensive chronic   kidney disease  COMMENTS:       PROCEDURE: [place]  - US KIDNEY  - Jul 02 2013  4:29PM     CLINICAL DATA:  Hypertension.  Chronic renal disease.    EXAM:  RENAL/URINARY TRACT ULTRASOUND COMPLETE    COMPARISON:  None.    FINDINGS:  Right Kidney:  Length: 9.4 cm. Mildly echogenic . This suggests medical renal  disease. Two simple cyst on right kidney, largest measures 3.5 cm in  maximum diameter. No hydronephrosis.    Left Kidney:    Length: 9.2 cm. Mildly echogenic suggesting chronic medical renal  disease. Cyst with thin septation with maximum diameter 1.4 cm.  No  hydronephrosis 2    Bladder:    Appears normal for degree of bladder distention.  IMPRESSION:  Slightly echogenic kidneys suggesting chronic medical renal disease.  No hydronephrosis or bladder distention.      Electronically Signed    By: Marcello Moores  Register    On: 07/02/2013 16:34         Verified By: Osa Craver, M.D., MD    [phone] 12:15, [place] Color Flow Doppler Upper Extrem Right (Arm)  [place] Color Flow Doppler Upper Extrem Right (Arm)  REASON FOR EXAM:    swelling R arm - dialysis cath - right upper chest  COMMENTS:       PROCEDURE: [place]  - US DOPPLER UP EXTR RIGHT  - Nov 26 2013 12:15PM     CLINICAL DATA:  59 year old female with right upper extremity  swelling    EXAM:  RIGHT UPPEREXTREMITY VENOUS DOPPLER ULTRASOUND    TECHNIQUE:  Gray-scale sonography with graded compression, as well as color  Doppler and duplex ultrasound were performed to evaluate the upper  extremity deep venous system from the level of the subclavian vein  and including the jugular, axillary, basilic, radial, ulnar and  upper cephalic vein. Spectral Doppler was utilized to evaluate flow  at rest and with distal augmentation maneuvers.    COMPARISON:  Fluoroscopic images from Prior tunneled dialysis  catheter placement 10/29/2013    FINDINGS:  Internal Jugular Vein: The vessels expanding contains homogeneous  low-level internal echoes consistent with thrombus. The vessel is  not compressible. No flow was present within the vessel on color  Doppler.Overall, findings are consistent with occlusive thrombus.    Subclavian Vein: No evidence of thrombus. Normal compressibility.  Blunted respiratory phasicity.  Axillary Vein: No evidence of thrombus. Normal compressibility, and  response to augmentation. Blunted respiratory phasicity.    Cephalic Vein: No evidence of thrombus. Normal compressibility and  response to augmentation.    Basilic Vein: No evidence of thrombus. Normal  compressibility and  response to augmentation.    Brachial Veins: No evidence of thrombus. Normal compressibility and  response to augmentation.    Radial Veins: No evidence of thrombus. Normal compressibility and  response to augmentation.  Ulnar Veins: No evidence of thrombus. Normal compressibility and  response to augmentation.    Venous Reflux:  None visualized.    Other Findings:  None visualized.     IMPRESSION:  1. Positive for occlusive thrombus in the right internal jugular  vein.  2. Decreased respiratory phasicity in the axillary and subclavian  veins raises the possibility of thrombus extension into the right  brachiocephalic vein and superior vena cava.    These results will be called to the ordering clinician or  representative by the Radiologist Assistant, and communication  documented in the PACS or zVision Dashboard.      Electronically Signed    By: Jacqulynn Cadet M.D.    On: 11/26/2013 12:40         Verified By: Criselda Peaches, M.D.,  [place]:    14-Nov-14 14:35, US Kidney Bilateral  PACS Image    18-Feb-15 15:19, Digital Diagnostic Mammogram Bilateral  PACS Image    18-Feb-15 15:47, MAM [place] Right Limited  PACS Image    03-Mar-15 13:38, MAM [place] Core Biopsy Right  PACS Image    03-Mar-15 14:22, [facility]  PACS Image    22-May-15 14:39, [place] Non Vascular Extremity Right  PACS Image    26-May-15 10:07, C-Arm With Spot Images  PACS Image    26-May-15 10:51, Chest Portable Single View  PACS Image    08-Jun-15  16:29, US Kidney Bilateral  PACS Image    18-Jun-15 07:13, CT Abdomen and Pelvis Without Contrast  PACS Image    24-Oct-15 13:57, Abdomen 3 Way Includes PA Chest  PACS Image    02-Nov-15 12:02, Chest Portable Single View  PACS Image    [phone] 12:15, [place] Color Flow Doppler Upper Extrem Right (Arm)  PACS Image  CT:    18-Jun-15 07:13, CT Abdomen and Pelvis Without Contrast  CT Abdomen and Pelvis Without Contrast  REASON FOR  EXAM:    (1) generalized abdominal pain, back pain, h/o breast   cancer; (2) generalized ab  COMMENTS:       PROCEDURE: CT  - CT ABDOMEN AND PELVIS W0  - Jul 12 2013  7:13AM     CLINICAL DATA:  Abdominal and back pain    EXAM:  CT ABDOMEN AND PELVIS WITHOUT CONTRAST    TECHNIQUE:  Multidetector CT imaging of the abdomen and pelvis was performed  following the standard protocol without IV contrast.  COMPARISON:  Renal ultrasound dated 07/02/2013.    FINDINGS:  Scarring versus atelectasis within the lung bases multichamber  cardiac enlargement. Atherosclerotic calcifications within the aorta  and coronary vessels.    Noncontrast evaluation of the liver, spleen, adrenals, pancreas is  unremarkable.    Benign-appearing cyst again appreciated within the right kidney. No  evidence of hydronephrosis or nephrolithiasis. Small cysts within  the left kidney. Atherosclerotic calcification appreciated. There is  no evidence of hydronephrosis or nephrolithiasis.  Calcifications within the dependent portion the gallbladder.    There is no evidence of abdominal aortic aneurysm.    The bowel is negative.    There is no evidence of abdominal aortic aneurysm. Diffuse  atherosclerotic calcifications are appreciated.    Within the limitations of a noncontrast CT there is no evidence of  masses, free fluid, loculated fluid collections nor adenopathy.    Degenerated fibroids within the uterus.    Multilevel spondylosis without aggressive appearing osseous lesions.  No abdominal wall nor inguinal hernia.     IMPRESSION:  Bilateral renal cysts.    Gallbladder calcifications. Gallbladder fossa otherwise  unremarkable.    Within the limitations of a noncontrast CT otherwise no evidence of  abdominal or pelvic pathology.    Atherosclerotic calcifications.    Atelectasis versus scarring within the lung bases.  Electronically Signed    By: Margaree Mackintosh M.D.    On: 07/12/2013 07:32          Verified By: Mikki Santee, M.D., MD  [facility]:    18-Feb-15 15:19, Digital Diagnostic Mammogram Bilateral  Digital Diagnostic Mammogram Bilateral  REASON FOR EXAM:    RT BR MASS AND NIPPLE DISCHARGE AND YRLY  COMMENTS:       PROCEDURE: MAM - MAM DGTL DIAGNOSTIC MAMMO W/CAD  - Mar 14 2013  3:19PM     CLINICAL DATA:  The patient gives a history of right breast  spontaneous bloody nipple discharge.    EXAM:  DIGITAL DIAGNOSTIC  bilateral MAMMOGRAM WITH CAD    ULTRASOUND right BREAST    COMPARISON:  None  ACR Breast Density Category c: The breast tissue is heterogeneously  dense, which may obscure small masses.    FINDINGS:  There is diffusely increased density noted within the upper outer  quadrant of the right breast when compared with the left side. Also,  there is mild diffusely increased density present within the  subareolar portion of the right breast when compared with  the left  side. There is a small group of suspicious pleomorphic  calcifications located within the upper outer quadrant of the right  breast with linear forms present. These calcifications span 7 mm.  These are worrisome for DCIS.    Mammographic images were processed with CAD.  On physical exam, there is thickening present within the upper outer  quadrant of the right breast located at the 10 o'clock position  approximately 4-8 cm from the nipple. No nipple discharge could be  expressed at this time.    Ultrasound is performed, showing numerous dilated ducts with  intraductal echogenic material most likely representing intraductal  tumor/masses. 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 Also,  the ductal dilatation extends to the level of the nipple.I  recommend ultrasound-guided core needle biopsy of this area. I have  discussed this with the patient. This will be scheduled.    The right axilla was not evaluated at the time of this ultrasound  and should be evaluated prior to  ultrasound-guided core biopsy.   IMPRESSION:  1. Diffusely dilated ducts with intraductal soft tissue masses  located within the upper outer quadrant right breast at 10 o'clock  position with palpable thickening in this region. There are, in  addition, worrisome calcifications in this region. 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 No nipple discharge could be  expressed at this time.    RECOMMENDATION:  1. Right breast ultrasound-guided core biopsy.  2. Ultrasound of the right axilla prior to the patient's  ultrasound-guided core biopsy.    I have discussed the findings and recommendations with the patient.  Results were also provided in writing at the conclusion of the  visit. If applicable, a reminder letter will be sent to the patient  regarding the next appointment.    BI-RADS CATEGORY  4: Suspicious abnormality - biopsy should be  considered.      Electronically Signed    By: Luberta Robertson M.D.    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  I am not sure her AVF will be useable for dialysis, but have discussed with primary service and Nephrology that if the fistula can be used, this would make the treatment for her jugular thrombosis easier and allow catheter removal.  They will try to use her AVF for her next dialysis treatment.  If her fistula works well, we could consider removing the catheter and doing a thrombolysis and thrombectomy later this week.   If the fistula is not useable, would use the Permcath and leave this in place and treat with anticoagulation alone for now.  Would consider another fistulagram to see if we can get the  fistula matured quicker.   Would have up to two weeks to consider thrombolysis and thrombectomy, but if Permcath has to remain in place this will have limited value.   Will follow   level 4   Electronic Signatures: Algernon Huxley (MD)  (Signed [phone] 15:48)  Authored: Chief Complaint and History, PAST MEDICAL/SURGICAL HISTORY, ALLERGIES, HOME MEDICATIONS, Family and Social History, Review of Systems, Physical Exam, LABS, RADIOLOGY, Assessment and Plan   Last Updated: 02-Nov-15 15:48 by  Dew, Jason S (MD) 

## 2014-05-18 NOTE — Consult Note (Signed)
PATIENT NAME:  Jessica Duran, TRELOAR MR#:  [id number] DATE OF BIRTH:  03-17-1955  DATE OF CONSULTATION:  11/30/2013  REFERRING PHYSICIAN:  Theodoro Grist, MD CONSULTING PHYSICIAN:  Cheral Marker. Ola Spurr, MD  REASON FOR CONSULTATION: Fever and suspected septic thrombophlebitis.   HISTORY OF PRESENT ILLNESS: This is a pleasant 59 year old female with history of end-stage renal disease on hemodialysis who was admitted November 2nd with pain and swelling in her right upper extremity. She was found to have a blood clot in the right internal jugular vein. Since then she has been seen by Dr. Delana Meyer and has been getting hemodialysis. She has also been seen by her oncologist, Dr. Ma Hillock, given her history of breast cancer. She has been treated with anticoagulation and antibiotics. Cultures grew Coag-negative staph which was felt to be a contaminant so her antibiotics were stopped; however, she has had recurrent fever. We are consulted for further antibiotic management.   Currently, the patient is somewhat confused. Yesterday, November 5th, she underwent a procedure on her left AV fistula with fistulogram and angioplasty. She complains of pain in that arm as well as continued swelling in her right arm. She complains of some pain in her neck, but otherwise denies symptoms.   PAST MEDICAL HISTORY: 1.  Breast cancer. Follows with Dr. Ma Hillock. Status post chemotherapy which is active and ongoing. 2.  End-stage renal disease on hemodialysis Tuesday, Thursday, and Saturday via a permanent hemodialysis catheter in the right chest. She has an AV fistula on the left, which has not yet matured.  3.  Prior CVA x3.  4.  Hypertension.  5.  Diabetes.  6.  Anemia of chronic disease.  7.  Secondary hyperparathyroidism.  PAST SURGICAL HISTORY:  1.  AV fistula placement. 2.  Left lumpectomy in May of 2015.   SOCIAL HISTORY: Continues to smoke 1/2 pack a day. No alcohol. She lives with her husband.   ALLERGIES: OXYCODONE,  PERCOCET, TYLENOL, LEVAQUIN, PENICILLIN, HYDRALAZINE, IMDUR, LISINOPRIL.   FAMILY HISTORY: Noncontributory.  REVIEW OF SYSTEMS: Difficult to obtain given the patient's mild confusion.   PHYSICAL EXAMINATION: VITAL SIGNS: Temperature currently 98.2, T-max over the last 24 hours was 102.1, pulse 84, blood pressure 160/79, respirations 20, sat 97% on room air.  GENERAL: She is pleasant but somewhat confused.  HEENT: Pupils equal, round, and reactive to light and accommodation. Extraocular movements are intact. Sclerae are anicteric. Oropharynx: She is edentulous. No oral lesions.  NECK: Supple. She does have some tenderness over her right side of her neck.  HEART: Regular.  LUNGS: Clear.  ABDOMEN: Soft, nontender.  EXTREMITIES: Her right upper extremity has 2+ edema. There is some tenderness. Her left forearm has tenderness over a recently operated on AV fistula. Lower extremities have no edema.   DIAGNOSTIC DATA: White blood count currently 5.1, hemoglobin 7.1, platelets 165,000. Renal panel is consistent with end-stage liver disease. Blood cultures November 2nd grew 1 of 2 Coag-negative staph, felt to be contaminant. It was in the anaerobic bottle only.   Imaging: Ultrasound of right upper extremity shows occlusive thrombus in the right internal jugular vein.   Chest x-ray November 2nd showed no acute cardiopulmonary disease.  Head CT November 6th shows mild diffuse cortical atrophy. No acute intracranial abnormalities.   Repeat chest x-ray shows no active disease.   IMPRESSION: A 59 year old female with end-stage renal disease on hemodialysis with a right chest wall hemodialysis catheter and a recently accessed AV fistula in her left upper extremity admitted with internal jugular thrombosis.  She is undergoing chemotherapy for breast cancer as well. She has had fevers, but blood cultures have only grown Coag-negative staph. Chest x-ray is negative.   RECOMMENDATIONS: 1.  I would restart  vancomycin.  2.  If she remains febrile, I would broaden her coverage. Septic thrombophlebitis in the jugular vein can also be caused by oral bacteria and anaerobes. That usually is an initial illness that begins with a pharyngitis or a parapharyngeal abscess, which we have no evidence of. I suspect her usual pathogen would be a gram-positive associated with her dialysis. However, if she continues to be febrile despite the vancomycin, I would add meropenem as she is penicillin allergic.  3.  If she has recurrent fever, I would suggest repeat blood cultures.   Thank you for the consult. I would be glad to follow with you.  ____________________________ Cheral Marker. Ola Spurr, MD dpf:sb D: 11/30/2013 14:39:17 ET T: 11/30/2013 15:14:07 ET JOB#: [id number]  cc: Cheral Marker. Ola Spurr, MD, <Dictator> Jaya Lapka Ola Spurr MD ELECTRONICALLY SIGNED 12/04/2013 17:38

## 2014-05-18 NOTE — Op Note (Signed)
PATIENT NAME:  Jessica Duran, Jessica Duran MR#:  [id number] DATE OF BIRTH:  1955-03-28  DATE OF PROCEDURE:  11/29/2013  PREOPERATIVE DIAGNOSES:  1.  End-stage renal disease.  2.  Poorly maturing left arm arteriovenous fistula.  3.  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  4.  Breast cancer.   PROCEDURES:  1.  Ultrasound guidance for vascular access in 2 separate locations of the left cephalic vein in the forearm and the left basilic vein in the upper arm for intervention.  2.  Left upper extremity fistulogram and central venogram.  3.  Percutaneous transluminal angioplasty of perianastomotic stenosis with 6 mm diameter drug-coated Lutonix angioplasty balloon.  4.  Percutaneous transluminal angioplasty of proximal and mid forearm cephalic vein stenosis with an 8 mm diameter angioplasty balloon.  5.  Viabahn covered stent placement to proximal and mid forearm cephalic vein for extravasation and residual stenosis after angioplasty.   SURGEON:  Algernon Huxley, MD.   ANESTHESIA:  Local with moderate conscious sedation.   ESTIMATED BLOOD LOSS: 25 mL.   INDICATION FOR PROCEDURE: This is a 59 year old female who was admitted to the hospital with central venous thrombosis. She has a PermCath on the right, a Port-A-Cath on the left. She has breast cancer and ongoing treatment for this. Her left radiocephalic AV fistula is poorly maturing.  She was brought to angiography suite for further evaluation and potential treatment in hopes of getting her fistula usable so that her catheter can be removed. Risks and benefits were discussed. Informed consent was obtained.   DESCRIPTION OF PROCEDURE:  The patient was brought to the vascular suite, left upper extremity sterilely prepped and draped and a sterile surgical field was created. The fistula was accessed in a retrograde fashion in the proximal forearm under direct ultrasound guidance in the cephalic vein in a retrograde fashion.  It was accessed  without difficulty and a permanent image was recorded. A micropuncture wire and sheath were placed. We upsized to a 6 [place] sheath. Imaging showed some intimal hyperplasia at the anastomosis. The vein was diffusely small. I gave the patient a small dose of intravenous heparin and crossed into the radial artery without difficulty with a Magic Torque wire. I have treated the area perianastomotic stenosis with a 6 mm diameter drug-coated Lutonix angioplasty balloon, inflation held for 1 minute. The balloon was then deflated. To address the small cephalic vein in the proximal and mid forearm an 8 mm diameter angioplasty balloon was inflated. Multiple areas of narrowing were seen, which were not entirely resolved with angioplasty and completion angiogram following this showed extravasation and residual stenosis within the forearm cephalic vein. This was too close to the access site to completely address. The outflow in the upper arm through the basilic vein; the central veins were somewhat sluggish, but we were not equipped to treat this. Actually the left side did not appear all that bad with only some mild narrowing and thrombus in the left innominate vein.   The basilic vein was accessed under direct ultrasound guidance in a retrograde fashion in the mid upper arm. I was able to cross into the cephalic vein and treat the area in question. A 7 French sheath and a 0.018 wire were placed. The forearm sheath was removed and a 4-0 Monocryl pursestring suture was placed. Again, significant extravasation was seen and I elected to place a covered stent over this area.  An 8 mm diameter x 10 cm length Viabahn covered stent was  used to cover this area, post-dilated with an 8-mm balloon with an excellent angiographic completion result and no residual extravasation, only mild residual stenosis.   At this point I terminated the procedure. The sheath was removed, 4-0 Monocryl pursestring suture was placed. Pressure was held. A  sterile dressing were placed. The patient tolerated the procedure well and was taken to the recovery room in stable condition.    ____________________________ Algernon Huxley, MD jsd:lt D: 11/29/2013 09:29:00 ET T: 11/29/2013 10:25:52 ET JOB#: [id number]  cc: Algernon Huxley, MD, <Dictator> Algernon Huxley MD ELECTRONICALLY SIGNED 12/10/2013 10:12

## 2014-05-18 NOTE — Op Note (Signed)
PATIENT NAME:  Jessica Duran, PODOLL MR#:  [id number] DATE OF BIRTH:  [birth year]  DATE OF PROCEDURE:  10/29/2013  PREOPERATIVE DIAGNOSES:   1.  End-stage renal disease.  2.  Arteriovenous fistula not yet mature for dialysis.    POSTOPERATIVE DIAGNOSES: 1.  End-stage renal disease.  2.  Arteriovenous fistula not yet mature for dialysis.    PROCEDURES: 1.  Ultrasound guidance for vascular access to right internal jugular vein.  2.  Fluoroscopic guidance for placement of catheter.  3. Placement of a 19 cm tip-to-cuff tunneled hemodialysis catheter via the right internal jugular vein.   SURGEON:  Algernon Huxley, MD.    ANESTHESIA: Local with sedation.   BLOOD LOSS: Minimal.    INDICATION FOR PROCEDURE: This is a 59 year old African-American female with chronic kidney disease. She has now progressed to need dialysis and has reached endstage renal disease. She has an AV fistula which is not yet mature for use for dialysis and she will be brought back at a later date for a fistulogram in hopes of improving the function of the fistula. For now she needs a PermCath for immediate dialysis access. Risks and benefits were discussed. Informed consent was obtained.   DESCRIPTION OF THE PROCEDURE: The patient was brought to the vascular and interventional radiology suite. The patient's right neck and chest were sterilely prepped and draped and a sterile surgical field was created. The right internal jugular vein was visualized with ultrasound and found to be patent. It was then accessed under direct ultrasound guidance and a permanent image was recorded. A wire was placed. After a skin nick and dilatation, the peel-away sheath was placed over the wire.   I then turned my attention to an area under the clavicle. Approximately 2 fingerbreadths below the clavicle a small counter incision was created and we tunneled from the subclavicular incision to the access site. Using fluoroscopic guidance, a 19 cm tip-to-cuff  tunneled hemodialysis catheter was selected, tunneled from the subclavicular incision to the access site. It was then placed through the peel-away sheath and the peel-away sheath was removed. The catheter tips were parked in the right atrium. The appropriate distal connectors were placed. It withdrew blood well and flushed easily with heparinized saline and a concentrated heparin solution was then placed. It was secured to the chest wall with 2 Prolene sutures. The access incision was closed with a single 4-0 Monocryl. A 4-0 Monocryl pursestring suture was placed around the exit site. Sterile dressings were placed.   The patient tolerated the procedure well and was taken to the recovery room in stable condition.     ____________________________ Algernon Huxley, MD jsd:bu D: 10/29/2013 11:09:41 ET T: 10/29/2013 13:00:52 ET JOB#: [id number]  cc: Algernon Huxley, MD, <Dictator> Algernon Huxley MD ELECTRONICALLY SIGNED 11/06/2013 10:22

## 2014-05-18 NOTE — Op Note (Signed)
PATIENT NAME:  Jessica Duran, Jessica Duran MR#:  [id number] DATE OF BIRTH:  04-14-1955  DATE OF PROCEDURE:  06/19/2013  PREOPERATIVE DIAGNOSIS: Cancer of the right breast.   POSTOPERATIVE DIAGNOSIS: Cancer of the right breast.   PROCEDURE: Insertion of central venous catheter with subcutaneous infusion port.   SURGEON: Loreli Dollar, MD  ANESTHESIA: Local 1% Xylocaine with monitored anesthesia care.   INDICATIONS: This 59 year old female recently had excision of a large mass of the right breast with findings of cancer and is now needing central venous access for chemotherapy.   DESCRIPTION OF PROCEDURE: The patient was placed on the operating table in the supine position. A rolled sheet was placed behind the shoulder blades. The neck was extended. The head was placed on a doughnut ring. The patient was sedated by the anesthesia staff and monitored. The neck and left subclavian areas were prepared with ChloraPrep and draped in a sterile manner.   The skin beneath the clavicle was infiltrated with 1% Xylocaine. A transversely oriented 3 cm incision was made, carried down through subcutaneous tissues. There was some ecchymosis found medially which appeared to be related to the injection of Xylocaine. Several small bleeding points were cauterized. The dissection was carried down through subcutaneous tissues and created a pouch just anterior to the deep fascia large enough to admit the [place] port.   Next, the jugular vein was identified with ultrasound and also could see the carotid artery. The skin overlying the jugular vein was infiltrated with 1% Xylocaine. A transversely oriented 6 mm incision was made. A needle was inserted into the jugular vein using ultrasound guidance. An image was saved for the paper chart. The guidewire was advanced down through the needle and into the central circulation, and the needle was removed. Next, fluoroscopy was used to see the location of the guidewire in the superior vena  cava. The dilator and introducer sheath were advanced over the guidewire. The guidewire and dilator were removed. The introducer sheath was used to advance the catheter into the central circulation, and the sheath was peeled away. The tip of the catheter was placed in the superior vena cava, and this was demonstrated with fluoroscopy. A fluoroscopic image was saved for the paper chart. Next, the catheter was tunneled to the subclavian incision, and pressure was held over the tunnel site as the patient was placed in reverse Trendelenburg position. Next, the catheter was cut to fit and attached to the port, using the accompanying sleeve to secure it. The port was accessed with Charisse March needle and aspirated a Jessica Duran of blood and flushed with 10 mL of heparinized saline solution. The port was placed into the subcutaneous pouch and was sutured to the deep fascia with 4-0 silk. Hemostasis was intact. The wound was closed with subcutaneous 5-0 Vicryl sutures, and also both skin incisions were closed with 5-0 Vicryl subcuticular suture and Dermabond.   The patient tolerated the procedure satisfactorily and was then prepared for transfer to the recovery room.  ____________________________ Lenna Sciara. Rochel Brome, MD jws:lb D: 06/19/2013 10:42:07 ET T: 06/19/2013 11:11:15 ET JOB#: [id number]  cc: Loreli Dollar, MD, <Dictator> Loreli Dollar MD ELECTRONICALLY SIGNED 06/20/2013 12:44

## 2014-05-18 NOTE — H&P (Signed)
PATIENT NAME:  Jessica Duran, Jessica Duran MR#:  [id number] DATE OF BIRTH:  [birth year]  DATE OF ADMISSION:  11/26/2013  PRIMARY CARE PHYSICIAN: Christian Mate. Megan Salon, MD NEPHROLOGIST:  Lavonia Dana, MD ONCOLOGIST: Rhett Bannister. Ma Hillock, MD  HISTORY OF PRESENT ILLNESS: The patient is a 59 year old African American female with history of end-stage renal disease with hemodialysis Tuesdays, Thursdays, Saturdays via permanent catheter placed in the right chest, comes to the hospital with complaints of right upper extremity swelling. Apparently, the patient was doing well up until Saturday, her usual hemodialysis day. She suddenly started noticing swollen right arm, which was not after hemodialysis. She was also complaining of feeling subjective fevers, having subjective fevers and just not feeling well, feeling weak and tired. Her temperature was 100.0 here in the Emergency Room. She was also expectorating apparently some greenish phlegm and has had significant sinus congestion and sinus pain, green sinus discharge. She had swelling in her right upper extremity for which she had right upper extremity ultrasound which revealed extensive right internal jugular vein thrombosis, possibly involving right brachiocephalic vein as well as superior vena cava. This was discussed with Dr. Delana Meyer who recommended conservative therapy with heparin at this time and transition to Coumadin therapy,  also arm elevation. Hospitalist services were contacted for admission.   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 End-stage renal disease for which she gets hemodialysis Tuesdays, Thursdays, Saturdays via right permanent hemodialysis catheter in right chest, stroke in the past in 1997, 2004 as well as 2013. Smoking, hypertension, status post lumpectomy in May 2015, diabetes mellitus type 2, anemia of chronic disease, secondary  hyperparathyroidism as well as hypertension. Also, recent admission for nausea, vomiting, diarrhea at the end of October 2015, discharged 11/21/2013. The patient is on adjuvant chemotherapy with Taxol.  ALLERGIES: Oxycodone which gives her hallucinations. Percocet, hallucinations. Tylenol, headache. She is also allergic to Levaquin, Penicillin, Hydralazine, Imdur, and Lisinopril.   SOCIAL HISTORY: Smokes approximately 1/2 pack a day. She tells me 2 cigarettes a day, however, she has been smoking for 20 or more years since the age of 46. No alcohol abuse. No illicit drugs. Lives with her husband.   FAMILY HISTORY: History of pancreatic cancer in the patient's mother at the age of 24. No history of breast or colon cancer, per medical history.   MEDICATIONS: According to the medical record, the patient is on amlodipine 10 mg p.o. daily, carvedilol 6.25 mg twice daily, clonidine 0.1 mg twice daily. Dexamethasone 4 mg 2 tablets once at bedtime night before Taxol chemotherapy and again 2 tablets of dexamethasone 4 mg in the morning before each Taxol chemotherapy. Famotidine 20 mg p.o. daily, furosemide 40 mg p.o. daily as needed, multivitamins as needed, prochlorperazine 10 mg 3 times daily as needed, promethazine 12.5 mg rectally suppository every 6 hours as needed, Rena-Vite multivitamins once daily, tramadol 50 mg every 6 hours as needed.   REVIEW OF SYSTEMS:  CONSTITUTIONAL: Difficult to obtain as the patient is somewhat uncomfortable in the bed. She admits to feeling feverish, fatigue and weak, pains in the right arm for the past 2 days. She does have blurring vision. She has sinus congestion as well as bloody green discharge from the sinuses for the past 1 week and sinus pains. Admits of cough with thick green phlegm and wheezes intermittently in her chest. Denies any hemoptysis. Admits of having dyspnea, especially on exertion. Admits of right upper extremity  edema as well as chronic lower extremity  edema, which seems to be not changing. Nausea and vomiting today in the morning. Also, abdominal pain intermittently and dysuria intermittently as well as incontinent. Denies any high fevers, weight loss or gain. EYES: Denies any double vision, glaucoma or cataracts. EARS, NOSE AND THROAT: Denies any tinnitus, allergies, epistaxis, sinus pain, dentures, difficulty swallowing.  RESPIRATORY: Admits of wheezing. Denies any hemoptysis.  CARDIOVASCULAR: Denies chest pains, orthopnea, arrhythmia, palpitations or syncope.  GASTROINTESTINAL: Denies any diarrhea, rectal bleeding, change in bowel habits.  GENITOURINARY: Denies hematuria, frequency.  ENDOCRINOLOGY: Denies any polydipsia, nocturia, thyroid problems, heat or cold intolerance. HEMATOLOGIC: Denies anemia, easy bruising or bleeding, swollen glands.  SKIN: Denies any acne, rash, lesions, or change in moles.  MUSCULOSKELETAL: Denies arthritis, cramps, swelling.  NEUROLOGIC: Denies numbness, epilepsy or tremor.  PSYCHIATRIC: Denies anxiety, insomnia or depression.   PHYSICAL EXAMINATION:  VITAL SIGNS: On arrival to the hospital, temperature was 100.0, pulse was 97, respiration was 18, blood pressure 139/84, oxygen saturation was 96% on room air.  GENERAL: This is a well-developed, obese African American female in moderate distress due to sinus congestion, difficulty breathing lying on the stretcher.  HEENT: Pupils were equal, reactive to light. Extraocular movements intact. No icterus or conjunctivitis. Has normal hearing. No pharyngeal edema. Mucosa is moist. NECK: No masses. Supple, nontender. Thyroid is not enlarged. No adenopathy. No JVD or carotid bruits bilaterally. Full range of motion. The patient 's neck is thick, obese and difficult to discern exactly JVD.  RESPIRATORY: Clear to auscultation anteriorly as well as posteriorly. A few rhonchi or rales were heard bilaterally. Somewhat diminished breath sounds but the patient has no labored  inspirations, increased effort, although very difficult to breathe for her due to some sinus congestion and nasal congestion. She breathes open mouth and for this reason she has somewhat increased effort, especially whenever she talks. No dullness to percussion. Not in overt respiratory distress.  CARDIOVASCULAR: S1, S2 appreciated. No murmurs, gallops or rubs. Rhythm was regular. PMI not lateralized. Chest is nontender to palpation. Pedal pulses 1+.  Trace to no lower extremity edema, calf tenderness or cyanosis was noted. ABDOMEN: Protuberant, soft, nontender. Bowel sounds are present. No hepatosplenomegaly or masses were noted.  RECTAL: Deferred.  MUSCLE STRENGTH: Able to move all extremities. No cyanosis, degenerative joint disease or kyphosis. The patient does have right upper extremity swelling extending to her right neck, however, no significant pain was noted on palpation of her hemodialysis catheter, which is in the right chest, or her neck. The patient does have a Port-A-Cath placed in the left chest area, which is infusing IV fluids and heparin. No abnormalities were found in that area.  SKIN: Otherwise, did not show any rashes, lesions, erythema, nodularity or induration. It was warm and dry to palpation. No significantly increased the redness or heat was noted in the right upper extremity.  LYMPHATIC: No adenopathy in the cervical region.  NEUROLOGICAL: Cranial nerves grossly intact. Sensory is intact. No dysarthria or aphasia. The patient is alert, oriented to time, person and place, cooperative. Memory is impaired.  PSYCHIATRIC: The patient's memory is not impaired. No significant confusion, agitation or depression were noted.   LABORATORY DATA: BMP showed creatinine of 3.41, potassium 3.3, estimated GFR will be 18, calcium of 7.7, magnesium 1.6, albumin level of 2.3, otherwise liver enzymes were unremarkable. Troponin is elevated at 0.08. White blood cell count 8.5, hemoglobin 8.6, platelet  count 129,000. Absolute neutrophil count is 5.5. Coagulation  panel is unremarkable with pro time of 13.7, INR 1.1, activated PTT 32.2. Urinalysis showed yellow hazy urine, 50 mg/dL glucose, negative for bilirubin or ketones, specific gravity 1.013, pH was 7.0, negative for blood, more than 500 protein, negative for nitrites, 1+ leukocyte esterase, 14 red blood cells, 93 white blood cells, no bacteria was seen, 1 epithelial cell and white cell clumps were present. Lactic acid level was found to be 0.8, which is within normal limits.   RADIOLOGIC STUDIES: Chest x-ray, portable single view, 11/26/2013, showed no active cardiopulmonary disease. Ultrasound of right arm 11/26/2013, revealed positive for occlusive thrombus in the right internal jugular vein, decreased respiratory phasicity in axillary and subclavian veins raises the possibility of thrombus extension into the right brachiocephalic vein and superior vena cava.   ASSESSMENT AND PLAN:  1. Right jugular thrombus. Start the patient on heparin IV. The case was discussed with Dr. Delana Meyer, vascular surgeon, who recommended to continue heparin for now and switch to Coumadin therapy for chronic anticoagulation, but there was no intent to out hemodialysis permanent catheter if no infection is noted.  2. Suspected septic thrombophlebitis with the patient having subjective fevers as well as weakness. We are going to initiate the patient on vancomycin IV. We will get blood cultures taken and we will follow blood cultures. I will discontinue vancomycin if blood cultures are negative for 48 hours.  3. Acute bronchitis as well as sinusitis. We will initiate the patient on Zithromax and we will get sputum cultures.  4. End-stage renal disease. We will continue the patient on hemodialysis. We will get nephrology involved.  5. Elevated troponin. We will get cardiologist involved, possibly demand ischemia.  6. Tobacco abuse. Nicotine replacement therapy will be  initiated, that was discussed with patient for 5 minutes.  TIME SPENT: One hour on the patient. ____________________________ Theodoro Grist, MD rv:TT D: 11/26/2013 14:58:00 ET T: 11/26/2013 15:34:50 ET  JOB#: [id number]  cc: Christian Mate. Megan Salon, MD Theodoro Grist MD ELECTRONICALLY SIGNED 11/27/2013 12:37

## 2014-05-18 NOTE — Consult Note (Signed)
ONCOLOGY follow-up note - chronic weakness same.States that swelling in the right neck and upper extremity is better, denies pain issues.no fevers. Eating steady.resting in bed, weak-looking, otherwise alert and oriented and converses appropriately. Mild pallor.          vitals - afebrile, stable          lungs - decreased breath sounds at bases, no rhonchi          abdomen- soft, nontender.          right upper extremity swelling and right neck swelling is better.Hb 8.9, creatinine 2.52.  Patient with a history of multiple medical problems including history of breast cancer s/p resection and now on adjuvant chemotherapy currently getting weekly Taxol treatments, who has Port-A-Cath in the left upper chest for intravenous access for chemotherapy, and PermCath in the right upper chest for dialysis access. Admitted with right upper extremity swelling and found to have right internal jugular deep venous thrombosis. Clinically symptoms of swelling and discomfort is improving well with ongoing anticoagulation, continue current anticoagulation plan. If deep vein thrombosis persists or progresses despite anticoagulation, then may need to consider removal of central venous catheters. Given ongoing weakness and above issues, will hold chemotherapy dose this week also, patient also prefers this. If patient is discharged soon, plan is to see her back at [facility] next Tuesday with labs and resume on chemotherapy if she is doing better. She is agreeable to this plan.   Electronic Signatures: Jonn Shingles (MD)  (Signed on 11-Nov-15 17:26)  Authored  Last Updated: 11-Nov-15 17:26 by Jonn Shingles (MD)

## 2014-05-18 NOTE — Discharge Summary (Signed)
PATIENT NAME:  Jessica Duran, Jessica Duran MR#:  [id number] DATE OF BIRTH:  November 06, 1955  DATE OF ADMISSION:  11/26/2013 DATE OF DISCHARGE:  12/05/2013  ADMITTING DIAGNOSES:  1.  Right jugular vein thrombus.  2.  Suspected septic thrombophlebitis.  3.  Acute bronchitis as well as sinusitis.  4.  End-stage renal disease.  5.  Elevated troponin.  6.  Tobacco abuse.   DISCHARGE DIAGNOSES: 1.  Right jugular vein thrombus, currently on Eliquis.  2.  Suspected septic thrombophlebitis with Staphylococcus epidermidis, vancomycin sensitive.  3.  Acute bronchitis and also likely sinusitis status post Zithromax for 5 days.  4.  End-stage renal disease. Continue hemodialysis.  5.  Elevated troponin, likely demand ischemia.  6.  Tobacco use. Nicotine replacement therapy was advised.   PROCEDURES:  Fistulogram and stent placement by Dr. Lucky Cowboy on 11/29/2013.   CONSULTATIONS: Oncology, nephrology, infectious disease (Dr. Ola Spurr), vascular surgery (Dr. Lucky Cowboy).   BRIEF HISTORY AND PHYSICAL AND HOSPITAL COURSE:  The patient is a 59 year old African-American female with a history of end-stage renal disease, getting hemodialysis on Tuesday, Thursday, and Saturday via permanent catheter placed on the right chest, who comes to the hospital with complaints of right upper extremity swelling. Also, she was complaining of subjective fever. Her temperature was 100 degrees Fahrenheit in the Emergency Room. She was also complaining of productive cough with sinus congestion and sinus pain. A right upper extremity ultrasound was done in the ED, which has revealed extensive right internal jugular vein thrombosis. At the time of admission, the thought was of the possibility of DVT involving the right brachiocephalic vein and the left superior vena cava. This was discussed with the on-call vascular surgeon, Dr. Delana Meyer, who had recommended conservative therapy with heparin and eventual transition to Coumadin. The patient was admitted to the  hospital and started on a heparin drip for right jugular vein thrombus.   1.  The patient has a recently placed arteriovenous fistula, but we were unable to use the AV fistula for hemodialysis, as it was not mature. As the patient did not have any other IV accesses, hemodialysis was continued through the right IJ. The patient was started on Coumadin. Oncology, Dr. Ma Hillock, and infectious disease, Dr. Ola Spurr, were consulted. She subsequently had a fistulogram to evaluate the maturity of the fistula. The right AV fistula was not mature enough. Stent was placed with the anticipation that the AV fistula could be used as soon as possible. Subsequently, the patient was started on Eliquis, and Coumadin was discontinued. Case management was consulted, and the plan is to continue Eliquis, as the patient's insurance covers Eliquis.  2.  Septic thrombophlebitis. Pancultures were obtained. The patient was continued on IV vancomycin during dialysis. In the beginning of the admission, the patient was spiking fevers, but no fevers were noticed since November 5. Repeat blood cultures have only grown Staphylococcus epidermidis in 1 out of 2 bottles. At one point, the patient's vancomycin was discontinued, as the patient did not have a fever. Unfortunately, the patient spiked a fever again, and vancomycin was restarted on 11/30/2013. ID has recommended to continue vancomycin until hemodialysis catheter can be removed and then for another 2 weeks following removal of the catheter. The suspicion was the hemodialysis catheter was infected with Staphylococcus epidermidis and cannot be removed until A-V fistula mature enough to use. Also, the thought was to check the permacath on the left chest wall, which also might have infection. The plan was to monitor it for infection once antibiotics  were stopped. Dr. Ola Spurr has recommended the patient to follow up with him as an outpatient in one week after discharge.  3.  The patient  has a chronic history of breast cancer. She sees Dr. Leia Alf. Dr. Ma Hillock has recommended not to use erythropoietin in view of her breast cancer and chemotherapy. He has recommended the patient to follow up with him as an outpatient after discharge.  4.  The patient has acute bronchitis and sinusitis. She has finished a 5-day course of azithromycin during the hospital course.  5.  End-stage renal disease. Hemodialysis was continued through the right-sided permacath, as it was another option. According to Dr. Lucky Cowboy, the AV fistula can be used in approximately one week after discharge. Hemodialysis can be continued on Tuesday, Thursday, and Saturday using the right-sided permacath.  6.  Elevated troponin was thought to be from demand ischemia. No further cardiology workup was recommended.  7.  The patient's altered mental status was thought to be from sepsis, which has resolved.  8.  Tobacco abuse. Consultation to quit smoking and nicotine replacement therapy also provided.  9.  The patient had episodes of constipation, and Milk of Magnesia was provided on an as-needed basis.  10.  For deconditioning, physical therapy was consulted. PT has evaluated the patient, who has recommended discharging the patient with home health. Case management was consulted, and home health was arranged. Unfortunately, the patient does not qualify for home PT. Outpatient physical therapy was recommended, but the patient refused outpatient physical therapy. Home health was arranged with nurse and nursing aid.    CONDITION AT TIME OF DISCHARGE:  Stable.   ACTIVITY: As tolerated.   CODE STATUS:  She is a full code.   FOLLOWUP APPOINTMENTS:   1.  With nephrology. Continue outpatient hemodialysis on Tuesday, Thursday, and Saturday.  2.  With oncology in 1 to 2 weeks, Dr. Ola Spurr in 1 to 2 weeks, and Dr. Lucky Cowboy in a week.   MEDICATIONS AT THE TIME OF DISCHARGE:  Coreg 6.25 mg 1 tablet p.o. 2 times a day, amlodipine 10 mg  once daily, clonidine 0.1 mg 2 times a day, prochlorperazine 10 mg 1 tablet orally 3 times a day as needed for nausea and vomiting, tramadol 50 mg p.o. q. 6 hours as needed for pain, furosemide 40 mg 1 tablet p.o. daily as needed for leg swelling, Rena-Vite with vitamin B complex and C 1 tablet p.o. once daily, famotidine 20 mg p.o. once daily, multivitamin 1 tablet once daily, promethazine 12.5 mg rectal suppository every 6 hours as needed for nausea and vomiting, dexamethasone 4 mg 2 tablets p.o. once a day the night before the chemotherapy, Eliquis 2.5 mg 2 times a day, vancomycin is to be continued during hemodialysis, vancomycin to be continued for 2 weeks after removal of the PermCath on the right side, Colace 100 mg 1 capsule p.o. 2 times a day as needed, Flonase 2 sprays once a day, nicotine one patch topically oncce daily.   LABORATORY AND IMAGING STUDIES: Hemoglobin on 11/11 was 8.9. Platelet count was normal. WBC was normal. Creatinine was 2.52. GFR 25. Albumin was 1.9. Blood culture collected on  11/30/2013, has revealed Staphylococcus epidermidis sensitive to vancomycin.   Imaging studies on November 6: Portable chest x-ray showed no active cardiopulmonary disease. Right upper extremity ultrasound was positive for occlusive thrombus in the right internal jugular vein. There was decreased respiratory capacity in the axillary and subclavian veins, raising the possibility of thrombus extension into the  right brachiocephalic vein and superior vena cava. CAT scan of the head without contrast was performed on November 6 for altered mental status, which has revealed mild diffuse cortical atrophy, mild chronic ischemic white matter disease. No acute intracranial abnormality. Chest x-ray, PA and lateral views, on November 6 showed no acute abnormality seen.   VITAL SIGNS: At the time of discharge, temperature 98.8, pulse 87, respirations 20, blood pressure 140/82, pulse oximetry 100% on room air.   The  diagnosis and plan of care was discussed in detail with the patient. She verbalized understanding of the plan.   TOTAL TIME SPENT ON THE DISCHARGE:  45 minutes.     ____________________________ Nicholes Mango, MD ag:nb D: 12/09/2013 22:47:50 ET T: 12/10/2013 01:34:40 ET JOB#: [id number]  cc: Nicholes Mango, MD, <Dictator> Algernon Huxley, MD Cheral Marker. Ola Spurr, MD Sandeep R. Ma Hillock, MD Unknown PCP   Nicholes Mango MD ELECTRONICALLY SIGNED 12/12/2013 22:11

## 2014-05-18 NOTE — Consult Note (Signed)
General Aspect 59 year old [place] American female with a history of stroke, left hemiparesis, history of malignant hypertension, chronic kidney disease, cocaine, admitted on May 3rd 2014, discharged on May 5th with headache and elevated blood pressure, now with increased peripheral edema affecting the upper and lower extremities (tight and painful), presenting with necka nd arm swelling, acute. Cardiology was consulted for elevated troponin.  Patient went for hemodialysis on Saturday where she noticed her right upper extremity and neck swelling. She ended treatment early. She then started to have shortness of breath and subjective fevers and brought to ED  In teh ER, she was noted to have R arm swelling, pain, subjective fevers, vomiting , also cough, green sputum, green nasal discharge, sinus pains.  T 1100 in ER,  leukocytosis. She had mild troiponin elevation.  U/S showed extensive R jugular thrombus, possibly extending to superior vena cava Admitted for rule out, and for management of thrombus  echocardiogram on May 5th 2015 showed ejection fraction of 55% to 60%. Evidence of decreased relaxation consistent with diastolic dysfunction. Moderate tricuspid regurgitation and mild pulmonary hypertension.   Present Illness . PAST SURGICAL HISTORY:  Appendectomy and C-section x3.   FAMILY HISTORY:  Her father suffered from stroke and died at the age of 71. He also suffered from hypertension. Her mother died of complications of pancreatic cancer.   SOCIAL HISTORY:  She is widowed. Now lives with her boyfriend.   SOCIAL HABITS:  She smokes cigarettes but only occasional. Occasionally, may use cocaine, last time was more than 2 months ago. She drinks alcohol once in a while.   Physical Exam:  GEN well developed, well nourished, no acute distress   HEENT hearing intact to voice, moist oral mucosa   NECK mildly swollen   RESP normal resp effort  clear BS   CARD Regular rate and rhythm   Tachycardic  No murmur   ABD denies tenderness  soft   LYMPH negative neck   EXTR positive edema, right arm with mild to moderate swelling   NEURO motor/sensory function intact   PSYCH alert, A+O to time, place, person, good insight   Review of Systems:  Subjective/Chief Complaint arm and neck swelling   General: Fatigue   Skin: No Complaints   ENT: No Complaints   Eyes: No Complaints   Neck: swelling   Respiratory: No Complaints   Cardiovascular: Dyspnea   Gastrointestinal: No Complaints   Genitourinary: No Complaints   Vascular: No Complaints   Musculoskeletal: No Complaints   Neurologic: No Complaints   Hematologic: No Complaints   Endocrine: No Complaints   Psychiatric: No Complaints   Review of Systems: All other systems were reviewed and found to be negative   Medications/Allergies Reviewed Medications/Allergies reviewed     CVA:    hyperparathyroidism:    Breast Cancer:    CVA/Stroke:    Diabetes Mellitus, Type II (NIDD):    Hypertension:    Excision right breast mass: May 2015   Appendectomy:        Admit Diagnosis:   JUGULAR VEIN THROMBOSIS.: Onset Date: 27-Nov-2013, Status: Active, Description: JUGULAR VEIN THROMBOSIS.  Home Medications: Medication Instructions Status  carvedilol 6.25 mg oral tablet 1 tab(s) orally 2 times a day Active  amLODIPine 10 mg oral tablet 1 tab(s) orally once a day (in the morning) Active  cloNIDine 0.1 mg oral tablet 1 tab(s) orally 2 times a day Active  prochlorperazine 10 mg oral tablet 1 tab(s) orally 3 times a day, As Needed -  for Nausea, Vomiting Active  traMADol 50 mg oral tablet 1 tab(s) orally every 6 hours, As Needed - for Pain Active  furosemide 40 mg oral tablet 1 tab(s) orally once a day, As Needed for leg swelling Active  Rena-Vite Vitamin B Complex with C and Folic Acid oral tablet 1 tab(s) orally once a day Active  famotidine 20 mg oral tablet 1 tab(s) orally once a day Active   dexamethasone 4 mg oral tablet 2 tab(s) orally once a day (at bedtime) the night before each Taxol chemotherapy Active  dexamethasone 4 mg oral tablet 2 tab(s) orally once a day (in the morning) of each Taxol chemotherapy Active  multivitamin 1 tab(s) orally once a day Active  promethazine 12.5 mg rectal suppository 1 suppository(ies) rectal every 6 hours, As Needed - for Nausea, Vomiting Active   Lab Results:  Hepatic:  03-Nov-15 01:15   Bilirubin, Total 0.4  Alkaline Phosphatase 95 (46-116 NOTE: New Reference Range 08/14/13)  SGPT (ALT) 28 (14-63 NOTE: New Reference Range 08/14/13)  SGOT (AST) 25  Total Protein, Serum  6.1  Albumin, Serum  2.1  Routine Chem:  03-Nov-15 01:15   Result Comment ALL LABS - This specimen was collected through an   - indwelling catheter or arterial line.  - A minimum of 70ms of blood was wasted prior    - to collecting the sample.  Interpret  - results with caution.  Result(s) reported on 27 Nov 2013 at 02:09AM.  Glucose, Serum  177  BUN  23  Creatinine (comp)  3.45  Sodium, Serum 139  Potassium, Serum 3.6  Chloride, Serum 105  CO2, Serum 26  Calcium (Total), Serum  7.5  Osmolality (calc) 286  eGFR (African American)  18  eGFR (Non-African American)  14 (eGFR values <632mmin/1.73 m2 may be an indication of chronic kidney disease (CKD). Calculated eGFR, using the MRDR Study equation, is useful in  patients with stable renal function. The eGFR calculation will not be reliable in acutely ill patients when serum creatinine is changing rapidly. It is not useful in patients on dialysis. The eGFR calculation may not be applicable to patients at the low and high extremes of body sizes, pregnant women, and vegetarians.)  Anion Gap 8  Cardiac:  03-Nov-15 01:15   CPK-MB, Serum 0.6  Routine Hem:  03-Nov-15 01:15   WBC (CBC) 7.6  RBC (CBC)  2.75  Hemoglobin (CBC)  8.3  Hematocrit (CBC)  25.2  Platelet Count (CBC)  130  MCV 92  MCH 30.1   MCHC 32.8  RDW 14.4  Neutrophil % 71.0  Lymphocyte % 15.4  Monocyte % 12.8  Eosinophil % 0.4  Basophil % 0.4  Neutrophil # 5.4  Lymphocyte # 1.2  Monocyte #  1.0  Eosinophil # 0.0  Basophil # 0.0   EKG:  Interpretation EKG shows NSR with nonspcific ST and T wave ABN   Radiology Results: XRay:    02-Nov-15 12:02, Chest Portable Single View  Chest Portable Single View   REASON FOR EXAM:    Sepsis  COMMENTS:       PROCEDURE: DXR - DXR PORTABLE CHEST SINGLE VIEW  - Nov 26 2013 12:02PM     CLINICAL DATA:  Right arm swelling with pain. Abdominal pain,  nausea, vomiting for past 2 days.    EXAM:  PORTABLE CHEST - 1 VIEW    COMPARISON:  10/25/2013    FINDINGS:  Right dialysis catheter is in place with tip in the upper  right  atrium. Left Port-A-Cath is unchanged with the tip in the SVC. No  pneumothorax. Heart is borderline in size. Lungs are clear. No  effusions. No acute bony abnormality.     IMPRESSION:  No active cardiopulmonary disease.      Electronically Signed    By: Rolm Baptise M.D.    On: 11/26/2013 12:28         Verified By: Raelyn Number, M.D.,    Oxycodone: Hallucinations  Percocet 10/325: Hallucinations  Tylenol: Headaches  Levaquin: Pain  Penicillin: Other  Hydralazine: Other  Isosorbide Dinitrate: Other  Lisinopril: Other  Vital Signs/Nurse's Notes: **Vital Signs.:   03-Nov-15 07:10  Temperature Temperature (F) 98.3  Celsius 36.8  Temperature Source oral  Pulse Pulse 90  Respirations Respirations 22  Systolic BP Systolic BP 277  Diastolic BP (mmHg) Diastolic BP (mmHg) 60  Mean BP 91  Pulse Ox % Pulse Ox % 97  Pulse Ox Activity Level  At rest  Oxygen Delivery Room Air/ 21 %    Impression 59 year old [place] American female with a history of stroke and resultant left hemiparesis, history of malignant hypertension, chronic kidney disease, cocaine, admitted on May 3rd 2014, discharged on May 5th with headache and elevated blood  pressure, prior hx of  peripheral edema affecting the upper and lower extremities (tight and painful) 6/14, presenting with arm and ne ck selling, elevated troponins.  1) Elevated cardiac enz: Likely demand ischemia in the setting of  LVH, renal failure, HTN, anemia Currently with no sx. prior echo 5/15 with normal EF. No further testing needed.  2) Arm swelling, neck swelling acute, from thrombus. started on warfarin vascular following  3) ESRD on HD, sone SOB today scheduled for HD today.  4) HTN: on Ca channel blocker, clonidine (previously held for dry mouth and extremity swelling, now back on these meds as an outpt) B-blocker held previously  for hx of cocaine use, but now back on as an outpt In setting of renal dysfunction, would avoid HCTZ, ACE, ARB   Electronic Signatures: Ida Rogue (MD)  (Signed [phone] 09:10)  Authored: General Aspect/Present Illness, History and Physical Exam, Review of System, Past Medical History, Health Issues, Home Medications, Labs, EKG , Radiology, Allergies, Vital Signs/Nurse's Notes, Impression/Plan   Last Updated: 03-Nov-15 09:10 by Ida Rogue (MD)

## 2014-05-18 NOTE — H&P (Signed)
PATIENT NAME:  Jessica Duran, WOOLSTON MR#:  [id number] DATE OF BIRTH:  01/28/1955  DATE OF ADMISSION:  11/17/2013  PRIMARY CARE PHYSICIAN: Dr. Megan Salon.   NEPHROLOGIST: Dr. Candiss Norse and Dr. Juleen China.   ONCOLOGIST: Rhett Bannister. Ma Hillock, MD.    CHIEF COMPLAINT: Intractable nausea, vomiting, diarrhea.   HISTORY OF PRESENT ILLNESS: A 59 year old female with breast cancer, resumed chemotherapy last Monday, noted to have diarrhea, vomiting, and abdominal pain since Wednesday. The patient's last dialysis was Thursday. The patient says that she is having mild midepigastric pain, not radiating to the back, associated with nausea, vomiting, diarrhea. The patient says that she is not able to eat for the last 1 week. Today she feels very weak and unable to go for dialysis which is due. Her last dialysis was last Thursday. The patient is having vomiting about 2 times a day and diarrhea about 2 times. Mainly the diarrhea is liquidly. Denies any blood in stools.   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 The patient is on adjuvant chemotherapy with Taxol. She has ESRD, on hemodialysis Tuesday, Thursday, Saturday. Last dialysis was Thursday. The patient also has a history of hypertension.   ALLERGIES: OXYCODONE, GIVES HER HALLUCINATIONS. PERCOCET GIVES HER HALLUCINATIONS. TYLENOL GIVES HEADACHE. SHE IS ALSO ALLERGIC TO LEVAQUIN, PENICILLIN, HYDRALAZINE, IMDUR AND LISINOPRIL.   SOCIAL HISTORY: Smokes occasionally. No drinking, no drugs.   PAST MEDICAL HISTORY: Includes history CVA x 3.   PAST SURGICAL HISTORY: Significant for right breast lumpectomy.   FAMILY HISTORY: Significant for her mother had pancreatic cancer at the age of 45. No history of breast or colon cancer.   MEDICATIONS: Decadron 4 mg takes about 8 mg tablets the morning of her chemotherapy, Tramadol 50 mg every 6 hours for pain, prochlorperazine 10 mg b.i.d., Coreg 6.25 mg p.o. b.i.d., amlodipine 10 mg p.o.  daily, clonidine 0.1 mg p.o. b.i.d., furosemide 40 mg p.o. daily, Rena-Vite 1 tablet daily.   REVIEW OF SYSTEMS: CONSTITUTIONAL: Feels very weak. Denies any fever.  EYES: No blurred vision.  ENT: No tinnitus. No ear pain. No epistaxis. No difficulty swallowing.  RESPIRATORY: Denies any cough or wheezing.  CARDIOVASCULAR: No chest pain. No orthopnea. No PND.  GASTROINTESTINAL: Has nausea, vomiting, diarrhea, and abdominal pain.  GENITOURINARY: No dysuria.  ENDOCRINE: No polyuria or polydipsia.  INTEGUMENTARY: No skin rashes.  MUSCULOSKELETAL: The patient denies any joint pain.  NEUROLOGIC: No numbness or weakness.  PSYCHIATRIC: No anxiety or insomnia.   PHYSICAL EXAMINATION: VITAL SIGNS: Temperature 98.2, heart rate 84, blood pressure 134/95, saturation 100% on room air.  GENERAL: Alert, awake, oriented. African-American female not in distress.  HEAD: Normocephalic, atraumatic.  EYES: Pupils equal, reacting to light. No conjunctival pallor. No scleral icterus.  NOSE: No nasal lesions. No drainage.  EARS: No drainage. No lesions.  MOUTH: No lesions. No exudates.  NECK: Supple. No JVD. No bruit. Thyroid in the midline, not enlarged. Normal range of motion.  RESPIRATORY: Good respiratory effort. Clear to auscultation. No wheeze. No rales.  CARDIOVASCULAR: S1, S2 regular. No murmurs.PMI not displaced. The patient has no peripheral edema.  GASTROINTESTINAL: Abdomen with epigastric tenderness present. No rebound tenderness. Bowel sounds present. No organomegaly.  MUSCULOSKELETAL: Normal gait and station. Patient extremities, moves x 4. No tenderness or effusion.   SKIN: Inspection is within normal limits, well-hydrated.  NEUROLOGIC: Cranial nerves II through XII are intact. Power 5/5 in upper and lower extremities. Sensation intact. DTRs 2+ bilaterally.   PSYCHIATRIC: Mood and affect are within  normal limits.   LABORATORY DATA: White count 1.3, hemoglobin 9.8, hematocrit 30.7, platelets  87,000. Electrolytes: Sodium 140, potassium 3.7, chloride 103, bicarbonate 28, BUN 31, creatinine 3.09, glucose 227. Creatinine was 3.34 on 10/19. Abdomen 3-way, the reports says no acute cardiopulmonary abnormality, nonobstructive bowel gas pattern. UA is clear.   ASSESSMENT AND PLAN:  1.  This patient is a 59 year old female with intractable nausea, vomiting, diarrhea, and epigastric pain that started after chemotherapy. The symptoms are likely related to chemotherapy. The patient is admitted to medical service, started her on IV fluids gently, and she will get 1 liter of IV fluid, continue PPI and also Zofran and IV famotidine.  2.  Gastroenteritis with diarrhea and vomiting and some abdominal pain. Please rule out Clostridium difficile. Because of her neutropenia, high risk for infections.  3.  End-stage renal disease, on hemodialysis. Missed dialysis today due to her nausea, vomiting, and weakness. Please consult nephrology regarding dialysis needs.  4.  Hypertension. Blood pressure is borderline here at 134/95. Her medications, mainly the clonidine and Coreg are restarted, but furosemide is not restarted because of diarrhea and vomiting. 5.  History of breast cancer. Followed up with Dr. Leia Alf. Emergency Room physician contacted Dr. Ma Hillock who suggested admission to medicine. The patient does have neutropenia, but she does not have any fever. We will follow neutropenic precautions at this time.   TIME SPENT: 60 minutes.   We will start the patient on clear liquids.    ____________________________ Epifanio Lesches, MD sk:at D: 11/17/2013 16:48:04 ET T: 11/17/2013 17:17:02 ET JOB#: [id number]  cc: Epifanio Lesches, MD, <Dictator> Epifanio Lesches MD ELECTRONICALLY SIGNED 12/24/2013 13:17

## 2014-05-18 NOTE — Consult Note (Signed)
Referring Physician:  Gregor Hams :   Reason for Consult: Admit Date: 14-Jan-2014  Chief Complaint: "My whole body has been aching"  Reason for Consult: transient neurologic deficit   History of Present Illness: History of Present Illness:   Jessica Duran is a 59 yo RIGHT-handed woman with a history of ESRD, breast cancer, HTN, prior cocaine abuse, and prior strokes with residual left hemiparesis who presents with gradual worsening of dysarthria and left hemiparesis. She complains of burning dysuria and generalized malaise over the last 1 week and worsening headache, dysarthria, and left hemiparesis over the last 3 days. She has not had any new neurologic symptoms, only worsening of previous symptoms that she experienced with her prior strokes.   ROS:  General chills  weakness  fatigue   HEENT no complaints   Lungs no complaints   Cardiac no complaints   GI no complaints   GU burning with urination  urinary frequency   Musculoskeletal no complaints   Extremities no complaints   Skin no complaints   Neuro as per HPI   Endocrine no complaints   Psych no complaints   Past Medical/Surgical Hx:  blood clot: right arm  CVA:   hyperparathyroidism:   Breast Cancer:   CVA/Stroke:   Diabetes Mellitus, Type II (NIDD):   Hypertension:   Excision right breast mass:   Appendectomy:   Home Medications: Medication Instructions Last Modified Date/Time  ondansetron 4 mg oral tablet 1 tab(s) orally every 6 hours as needed for nausea or vomiting. 22-Dec-15 01:50  docusate sodium 100 mg oral capsule 1 cap(s) orally 2 times a day, As needed, Stool Softener 22-Dec-15 01:50  fluticasone nasal 2 spray(s)  once a day 22-Dec-15 01:50  magnesium hydroxide 8% oral suspension 30 milliliter(s) orally 3 times a day 22-Dec-15 01:50  apixaban 2.5 mg oral tablet 1 tab(s) orally 2 times a day 22-Dec-15 01:50  carvedilol 6.25 mg oral tablet 1 tab(s) orally 2 times a day 22-Dec-15 01:50   amLODIPine 10 mg oral tablet 1 tab(s) orally once a day (in the morning) 22-Dec-15 01:50  cloNIDine 0.1 mg oral tablet 1 tab(s) orally 2 times a day 22-Dec-15 01:50  prochlorperazine 10 mg oral tablet 1 tab(s) orally 3 times a day, As Needed - for Nausea, Vomiting 22-Dec-15 01:50  traMADol 50 mg oral tablet 1 tab(s) orally every 6 hours, As Needed - for Pain 22-Dec-15 01:50  furosemide 40 mg oral tablet 1 tab(s) orally once a day, As Needed for leg swelling 22-Dec-15 01:50  Rena-Vite Vitamin B Complex with C and Folic Acid oral tablet 1 tab(s) orally once a day 22-Dec-15 01:50  famotidine 20 mg oral tablet 1 tab(s) orally once a day 22-Dec-15 01:50  dexamethasone 4 mg oral tablet 2 tab(s) orally once a day (at bedtime) the night before each Taxol chemotherapy 22-Dec-15 01:50  multivitamin 1 tab(s) orally once a day 22-Dec-15 01:50  promethazine 12.5 mg rectal suppository 1 suppository(ies) rectal every 6 hours, As Needed - for Nausea, Vomiting 22-Dec-15 01:50   Allergies:  Oxycodone: Hallucinations  Percocet 10/325: Hallucinations  Tylenol: Headaches  Levaquin: Pain  Penicillin: Other  Hydralazine: Other  Isosorbide Dinitrate: Other  Lisinopril: Other  Social/Family History: Social History: Prior crack cocaine abuser, but denies any use in last 5 years. Denies smoking or EtOH currently.   Vital Signs: **Vital Signs.:   22-Dec-15 11:45  Vital Signs Type Routine  Temperature Temperature (F) 98.3  Celsius 36.8  Temperature Source oral  Pulse Pulse 77  Respirations Respirations 18  Systolic BP Systolic BP 536  Diastolic BP (mmHg) Diastolic BP (mmHg) 79  Mean BP 100  Pulse Ox % Pulse Ox % 99  Pulse Ox Activity Level  At rest  Oxygen Delivery Room Air/ 21 %   EXAM: Ill-appearing, uncomfortable. No conjunctival injection or scleral edema. Oropharynx clear. No carotid bruits auscultated. Normal S1, S2 and regular cardiac rhythm on exam. Lungs clear to auscultation bilaterally.  Abdomen soft and nontender. Peripheral pulses palpated. No clubbing, cyanosis, or edema in extremities.  MENTAL STATUS: Alert and oriented to person, place, and time. Language fluent and appropriate. Cognition and memory conversationally intact with slowed processing speed CRANIAL NERVES: Visual fields full to confrontation. PERRL. EOMI. Facial sensation diminished in the left V1-3 distributions. Left facial weakness is present. Hearing intact to finger rub. Uvula midline with symmetric palatal elevation. Tongue midline without fasciculations. MOTOR: Muscle tone increased in the left arm and leg. 4/5 strength throughout left arm and leg with downward drift. Normal strength on right side. REFLEXES: 2+ in biceps, triceps, patella, and achilles bilaterally. Right extensor plantar response, left flexor SENSORY: Intact to vibration and pinprick throughout without extinction. COORDINATION: No ataxia or dysmetria on finger-nose or heel-shin maneuvers, though difficult to fully evaluate the left. GAIT: Not tested.  Lab Results: Thyroid:  22-Dec-15 02:31   Thyroid Stimulating Hormone 0.863 (0.45-4.50 (IU = International Unit)  ----------------------- Pregnant patients have  different reference  ranges for TSH:  - - - - - - - - - -  Pregnant, first trimetser:  0.36 - 2.50 uIU/mL)  Hepatic:  22-Dec-15 02:31   Bilirubin, Total 0.3  Alkaline Phosphatase 57 (46-116 NOTE: New Reference Range 08/14/13)  SGPT (ALT) 37 (14-63 NOTE: New Reference Range 08/14/13)  SGOT (AST) 28  Total Protein, Serum  5.7  Albumin, Serum  2.5  Routine Chem:  22-Dec-15 02:31   Glucose, Serum  280  BUN  43  Creatinine (comp)  3.22  Sodium, Serum 139  Potassium, Serum 3.7  Chloride, Serum  108  CO2, Serum 22  Calcium (Total), Serum  7.4  Osmolality (calc) 298  eGFR (African American)  19  eGFR (Non-African American)  16 (eGFR values <77m/min/1.73 m2 may be an indication of chronic kidney disease  (CKD). Calculated eGFR, using the MRDR Study equation, is useful in  patients with stable renal function. The eGFR calculation will not be reliable in acutely ill patients when serum creatinine is changing rapidly. It is not useful in patients on dialysis. The eGFR calculation may not be applicable to patients at the low and high extremes of body sizes, pregnant women, and vegetarians.)  Anion Gap 9  Routine Hem:  22-Dec-15 02:31   WBC (CBC)  2.3  RBC (CBC)  2.92  Hemoglobin (CBC)  8.7  Hematocrit (CBC)  27.0  Platelet Count (CBC) 155  MCV 93  MCH 29.9  MCHC 32.3  RDW  16.1  Neutrophil % 45.3  Lymphocyte % 46.0  Monocyte % 5.5  Eosinophil % 3.0  Basophil % 0.2  Neutrophil #  1.0  Lymphocyte # 1.1  Monocyte #  0.1  Eosinophil # 0.1  Basophil # 0.0 (Result(s) reported on 15 Jan 2014 at 06:13AM.)   Radiology Results: MRI:    22-Dec-15 10:17, MRI Brain Without Contrast  MRI Brain Without Contrast   REASON FOR EXAM:    possible new CVA  COMMENTS:       PROCEDURE: MR  - MR BRAIN WO CONTRAST  - Jan 15 2014 10:17AM     CLINICAL DATA:  59 year old female with frontal headache for 3 days.  History of breast cancer with on going chemotherapy. Dialysis  patient. Initial encounter.    EXAM:  MRI HEAD WITHOUT CONTRAST    TECHNIQUE:  Multiplanar, multiecho pulse sequences of the brain and surrounding  structures were obtained without intravenous contrast.  COMPARISON:  01/15/2014 head CT.    FINDINGS:  No acute infarct.    Remote anterior medial right frontal lobe infarct with  encephalomalacia.    Remote pontine infarcts.    Remote tiny cerebellar infarcts.    Remote basal ganglia infarcts more notable on the left. Remote tiny  thalamic infarcts.  Prominent white matter type changes confluent in the periventricular  region and relatively similar to prior exam suggestive of result of  small vessel disease.    Global atrophy without hydrocephalus.    Scattered  areas of blood breakdown products probably related to  prior episodes of hemorrhagic ischemia. It is difficult to determine  if these findings were noted on prior exam as gradient sequence was  not previously performed.    No intracranial mass lesion noted on this unenhanced exam. Difficult  to completely exclude small intracranial metastatic lesion without  contrast (which patient cannot receive because of dialysis).  Major intracranial vascular structures are patent with ectatic  vertebral arteries and basilar artery with superimposed  atherosclerotic type changes suspected but without occlusion.    Paranasal sinus mucosal thickening. Partial opacification mastoid  air cells. No obstructing lesion of the eustachian tube noted.    Cervical spondylotic changes with spinal stenosis and cord  flattening most notable C3-4 level incompletely assessed on present  exam. 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 MR of the cervical spine can be obtained for further  delineation.    Cervical medullary junction, pituitary region, pineal region and  orbital structures unremarkable.   IMPRESSION:  No acute infarct.    Remote infarcts as detailed above.    Prominent white matter type changes confluent in the periventricular  region and relatively similar to prior exam suggestive of result of  small vessel disease.    Global atrophy without hydrocephalus.    Scattered areas of blood breakdown products probably related to  prior episodes of hemorrhagic ischemia. It is difficult to determine  if these findings were noted on prior exam as gradient sequence was  not previously performed.  No intracranial mass lesion noted on this unenhanced exam. Difficult  to completely exclude small intracranial metastatic lesion without  contrast (which patient cannot receive because of dialysis).    Major intracranial vascular structures are patent with ectatic  vertebral  arteries and basilar artery with superimposed  atherosclerotic type changes suspected but without occlusion.    Paranasal sinus mucosal thickening. Partial opacification mastoid  air cells. No obstructing lesion of the eustachian tube noted.    Cervical spondylotic changes with spinal stenosis and cord  flattening most notable C3-4 level incompletely assessed on present  exam. Ossificationof the posterior longitudinal ligament may be  present. MR of the cervical spine can be obtained for further  delineation.      Electronically Signed    By: Chauncey Cruel M.D.    On: 01/15/2014 10:51         Verified By: Doug Sou, M.D.,  CT:    22-Dec-15 02:47, CT Head Without Contrast  CT Head Without Contrast   REASON FOR EXAM:  headache with worsened left arm & leg weakness  COMMENTS:       PROCEDURE: CT  - CT HEAD WITHOUT CONTRAST  - Jan 15 2014  2:47AM     CLINICAL DATA:  Headache and weakness for 3 days. Ongoing  chemotherapy for breast cancer.    EXAM:  CT HEAD WITHOUT CONTRAST    TECHNIQUE:  Contiguous axial images were obtained from the base of the skull  through the vertex without intravenous contrast.  COMPARISON:  11/30/2013    FINDINGS:  Mild cortical volume loss noted with proportional ventricular  prominence. Areas of periventricular white matter hypodensity are  most compatible with small vessel ischemic change. No acute  hemorrhage, infarct, or mass lesion is identified. CSF density  bilateral basal ganglia lacunar infarcts are reidentified. No acute  osseous abnormality. Orbits and paranasal sinuses are unremarkable.     IMPRESSION:  No acute intracranial finding.  Chronic findings as above.      Electronically Signed    By: Conchita Paris M.D.    On: 01/15/2014 02:49         Verified By: Arline Asp, M.D.,   Impression/Recommendations: Recommendations:   Jessica. Duran is a 59 yo RIGHT handed woman with history of breast cancer and prior  strokes with residual left hemiparesis. Her exam reveals left hemiparesis and facial weakness. Brain MRI does not show acute infarction. I believe her symptoms are most likely recrudescence of prior stroke symptoms in the setting of a systemic infection, and her history of burning dysuria suggests UTI may be the cause. No further neuroimaging or stroke workupASA 44m daily for secondary stroke preventionIdentification and treatment of infection per primary team you for the opportunity to participate in Jessica Duran's care. Please page Neurology with further questions. HMar Daring MD   Electronic Signatures: HCarmin Richmond(MD)  (Signed 22-Dec-15 13:31)  Authored: REFERRING PHYSICIAN, Consult, History of Present Illness, Review of Systems, PAST MEDICAL/SURGICAL HISTORY, HOME MEDICATIONS, ALLERGIES, Social/Family History, NURSING VITAL SIGNS, Physical Exam-, LAB RESULTS, RADIOLOGY RESULTS, Recommendations   Last Updated: 22-Dec-15 13:31 by HCarmin Richmond(MD)

## 2014-05-18 NOTE — Op Note (Signed)
PATIENT NAME:  Jessica Duran, Jessica Duran MR#:  [id number] DATE OF BIRTH:  1955/08/18  DATE OF PROCEDURE:  07/19/2013  PREOPERATIVE DIAGNOSES: 1.  Chronic kidney disease nearing dialysis dependence.  2.  Stroke.  3.  Diabetes.  4.  Hypertension.   POSTOPERATIVE DIAGNOSES:  1.  Chronic kidney disease nearing dialysis dependence.  2.  Stroke.  3.  Diabetes.  4.  Hypertension.   PROCEDURE: Left radiocephalic AV fistula creation.   SURGEON: Algernon Huxley, M.D.   ANESTHESIA: General.   ESTIMATED BLOOD LOSS: Approximately 25 mL.   INDICATION FOR PROCEDURE: This is an individual with chronic kidney disease nearing dialysis dependence. She needs permanent dialysis access and had adequate anatomy for left radiocephalic AV fistula creation. Risks and benefits were discussed. Informed consent was obtained.   DESCRIPTION OF PROCEDURE: The patient is brought to the operative suite and after an adequate level of general anesthesia was obtained the left upper extremity was sterilely prepped and draped and a sterile surgical field was created. An incision was created between the cephalic vein and the palpable radial artery. I dissected out the cephalic vein first which seemed to be a moderate size cephalic vein and usable for fistula creation. A single venous branch was ligated and divided between silk ties and this was marked for orientation. I then dissected out the radial artery, which was a small to moderate-sized artery, but was not heavily diseased and did appear usable for fistula creation. This was encircled with vessel loops proximally and distally. Then 3000 units of intravenous heparin was given for systemic anticoagulation and control was pulled up on the vessel loops. An anterior arteriotomy was created with an 11 blade and extended with Potts scissors. The vein was then ligated distally and transected. It was cut and beveled to an appropriate length to match the arteriotomy and an anastomosis was created  with a running 6-0 Prolene suture in the usual fashion. The vessel was flushed and de-aired prior to releasing control. On release a single 6-0 Prolene patch suture was used for hemostasis and hemostasis complete. The wound was then irrigated. Surgicel was placed. The wound was then closed with 3-0 Vicryl and 4-0 Monocryl. Dermabond was placed as a dressing. The patient was awakened from anesthesia and taken to the recovery room in stable condition having tolerated the procedure well.  ____________________________ Algernon Huxley, MD jsd:sb D: 07/19/2013 13:53:08 ET T: 07/19/2013 14:46:31 ET JOB#: [id number]  cc: Algernon Huxley, MD, <Dictator> Rogue Jury, MD (PCP and nephrologist) Algernon Huxley MD ELECTRONICALLY SIGNED 07/30/2013 15:08

## 2014-05-18 NOTE — Discharge Summary (Signed)
PATIENT NAME:  Jessica Duran, Jessica Duran MR#:  [id number] DATE OF BIRTH:  1955-01-26  DATE OF ADMISSION:  01/15/2014 DATE OF DISCHARGE:  01/21/2014  DISCHARGE DIAGNOSES: 1.  Left-sided weakness and dysarthria, likely due to prior stroke symptoms, worsened due to insomnia. CT and MRI negative for new infarct.  2.  Moderate spinal stenosis and severe bilateral neural foraminal stenosis seen on MRI of the cervical spine. 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  3.  Insomnia on melatonin and Ambien as needed.   SECONDARY DIAGNOSES:  1.  End-stage renal disease on hemodialysis.  2.  Hypertension.  3.  Deep venous thrombosis.  4.  History of breast cancer.  5.  History of cerebrovascular accident.   CONSULTATIONS:  1.  Nephrology, Dr. Glean Salen, for dialysis needs.  2.  Speech therapy.   PROCEDURES/RADIOLOGY:  1.  MRI of the brain without contrast, on 01/20/2014, showed no acute infarct. Cervical spondylolytic changes with spinal stenosis and cord flattening most notable at C3-C4 level.  2.  CT scan of the head without contrast on 01/15/2014 showed no acute intracranial findings.  3.  Bilateral carotid Dopplers on 01/15/2014 showed nonocclusive right IJ thrombus, no hemodynamically significant ICA stenosis. The degree of narrowing less than 50% bilaterally.  4.  MRI of the cervical spine without contrast on 01/16/2014 showed advanced multilevel cervical disk degeneration, worst at C4-C5, and also has moderate spinal stenosis and severe bilateral neural foraminal stenosis. Cervical cord myelomalacia noted at C3-C5.   MAJOR LABORATORY PANEL: Intact PTH was elevated with a value of 410.    HISTORY AND SHORT HOSPITAL COURSE: The patient is a 59 year old female with a long history of medical problems, who was admitted for dysarthria and possible new CVA. Please see Dr. Legrand Como Diamond's dictated history and physical for further  details. She underwent extensive neurological workup, which was all essentially negative for any acute changes. She did get evaluation by physical and occupational therapy recommending rehab placement initially, but due to her lack of insurance, she was not able to get any placement. Upon re-evaluation by physical therapy after several days of working with her, she was qualified for home health services, which she was agreeable, and she was discharged home as her symptoms had almost bent resolved and she was back to her baseline. None of her symptoms were thought to be due to chronic insomnia, for which she was started on melatonin and Ambien, which helped her.   ON THE DATE OF DISCHARGE, HER VITAL SIGNS WERE AS FOLLOWS: Temperature 98.1, heart rate 76 per minute, respirations 18 per minute, blood pressure 136/92. She was saturating 100% on room air. Respiratory rate was 20.   PERTINENT PHYSICAL EXAMINATION ON THE DATE OF DISCHARGE:  CARDIOVASCULAR: S1, S2 normal. No murmurs, rubs, or gallops. LUNGS: Clear to auscultation bilaterally. No wheezing, rales, rhonchi, crepitation.  ABDOMEN: Soft, benign.  NEUROLOGIC: Nonfocal examination.  All other physical examination remained at baseline.   DISCHARGE MEDICATIONS:   Medication Instructions  carvedilol 6.25 mg oral tablet  1 tab(s) orally 2 times a day   amlodipine 10 mg oral tablet  1 tab(s) orally once a day (in the morning)   clonidine 0.1 mg oral tablet  1 tab(s) orally 2 times a day   prochlorperazine 10 mg oral tablet  1 tab(s) orally 3 times a day, As Needed - for Nausea, Vomiting   tramadol 50 mg oral tablet  1 tab(s) orally  every 6 hours, As Needed - for Pain   furosemide 40 mg oral tablet  1 tab(s) orally once a day, As Needed for leg swelling   rena-vite vitamin b complex with c and folic acid oral tablet  1 tab(s) orally once a day   famotidine 20 mg oral tablet  1 tab(s) orally once a day   dexamethasone 4 mg oral tablet  2 tab(s)  orally once a day (at bedtime) the night before each Taxol chemotherapy   multivitamin  1 tab(s) orally once a day   promethazine 12.5 mg rectal suppository  1 suppository(ies) rectal every 6 hours, As Needed - for Nausea, Vomiting   magnesium hydroxide 8% oral suspension  30 milliliter(s) orally 3 times a day   apixaban 2.5 mg oral tablet  1 tab(s) orally 2 times a day   docusate sodium 100 mg oral capsule  1 cap(s) orally 2 times a day, As needed, Stool Softener   fluticasone nasal  2 spray(s)  once a day   ondansetron 4 mg oral tablet  1 tab(s) orally every 6 hours as needed for nausea or vomiting.   zolpidem 5 mg oral tablet  1 tab(s) orally once a day (at bedtime), As Needed insomnia   melatonin 3 mg oral tablet  1 tab(s) orally once a day (at bedtime)   aspirin 81 mg oral delayed release tablet  1 tab(s) orally once a day      DISCHARGE DIET: Dialysis diet.   DISCHARGE ACTIVITY: As tolerated.   DISCHARGE INSTRUCTIONS AND FOLLOWUP: The patient was instructed to follow up with her primary care physician, Dr. Rogue Jury, in 1-2 weeks. She will need followup with spine surgeon or neurosurgeon in [place] in 2-4 weeks. She will need followup with oncology, Dr. Leia Alf in 4-6 weeks. She was set up to get home physical therapy, occupational therapy, and nursing along with nursing aide.    TOTAL TIME DISCHARGING THIS PATIENT: 55 minutes.   She remains at very high risk for readmission.    ____________________________ Lucina Mellow. Manuella Ghazi, MD vss:MT D: 01/23/2014 16:53:57 ET T: 01/23/2014 17:32:00 ET JOB#: [id number]  cc: Larinda Herter S. Manuella Ghazi, MD, <Dictator> Rogue Jury MD Neurosurgery at [facility], MD Lucina Mellow [facility] MD ELECTRONICALLY SIGNED 01/23/2014 20:42

## 2014-05-24 ENCOUNTER — Ambulatory Visit: Payer: Medicare Other

## 2014-05-24 ENCOUNTER — Ambulatory Visit: Admission: RE | Admit: 2014-05-24 | Payer: Medicare Other | Source: Ambulatory Visit

## 2014-05-24 DIAGNOSIS — Z171 Estrogen receptor negative status [ER-]: Secondary | ICD-10-CM | POA: Insufficient documentation

## 2014-05-24 DIAGNOSIS — Z510 Encounter for antineoplastic radiation therapy: Secondary | ICD-10-CM | POA: Insufficient documentation

## 2014-05-24 DIAGNOSIS — C50911 Malignant neoplasm of unspecified site of right female breast: Secondary | ICD-10-CM | POA: Insufficient documentation

## 2014-05-25 ENCOUNTER — Ambulatory Visit: Admission: RE | Admit: 2014-05-25 | Payer: Medicare Other | Source: Ambulatory Visit

## 2014-05-26 NOTE — Op Note (Signed)
PATIENT NAME:  Jessica Duran, Jessica Duran MR#:  [id number] DATE OF BIRTH:  1955/08/20  DATE OF PROCEDURE:  04/24/2014  PREOPERATIVE DIAGNOSES:   1.  End-stage renal disease with functional permanent dialysis access.  2.  Breast cancer.  3.  Hypertension.   POSTOPERATIVE DIAGNOSES:  1.  End-stage renal disease with functional permanent dialysis access.  2.  Breast cancer.  3.  Hypertension.    PROCEDURE: Removal of right internal jugular PermCath.  SURGEON:  Algernon Huxley, MD   ANESTHESIA:  Local.  ESTIMATED BLOOD LOSS:  Minimal.  INDICATION FOR PROCEDURE:  This is a 59 year old female who has end-stage renal disease, but her permanent dialysis access is now functional and we can remove her PermCath.   Risks and benefits were discussed and informed consent was obtained.   DESCRIPTION OF PROCEDURE:  The patient's right neck, chest, and existing catheter were sterilely prepped and draped.  The area around the catheter was anesthetized copiously with 1% Lidocaine. The catheter was dissected out with curved hemostats until the cuff was freed from the surrounding fibrous sheath.  The fibrous sheath was transected and the catheter was then removed in its entirety using gentle traction.  Pressure was held and sterile dressings placed.    The patient tolerated the procedure well and was taken to the recovery room in stable condition.     ____________________________ Algernon Huxley, MD jsd:at D: 04/24/2014 12:13:01 ET T: 04/24/2014 18:48:36 ET JOB#: [id number]  cc: Algernon Huxley, MD, <Dictator> Algernon Huxley MD ELECTRONICALLY SIGNED 04/29/2014 15:49

## 2014-05-26 NOTE — Discharge Summary (Signed)
PATIENT NAME:  Jessica Duran, Jessica Duran MR#:  [id number] DATE OF BIRTH:  Apr 21, 1955  DATE OF ADMISSION:  03/08/2014 DATE OF DISCHARGE:  03/09/2014  ADMITTING DIAGNOSES:  1.  Nausea. 2.  Hypertensive urgency.   DISCHARGE DIAGNOSES: 1.  Nausea, likely related to her chemotherapy, now resolved.  2. Hypertensive, urgency, possibly anxiety-induced. Her blood pressure is normal now.  3.  End-stage renal disease on hemodialysis.  4. Breast cancer, which is being followed by Dr. Ma Hillock.  5.  Deep venous thrombosis, on Eliquis. 6.  History of cerebrovascular accident x 2. 7.  Status post appendectomy.  8.  Status post cesarean section x 3.  9.  Vascular catheter placement as well as Port-A-Cath placement.   CONSULTANTS: None.   PERTINENT LABORATORY DATA AND EVALUATIONS: Admitting glucose 153, BUN 9, creatinine 1.72, sodium 137, potassium 3.4, chloride 101, CO2 of 29, calcium 8.2. Lipase was normal at 172.  LFTs showed albumin of 3.0, AST 50, ALT 58. WBC count 2.4, hemoglobin 10.5, platelet count 201,000.   EKG: Normal sinus rhythm, possible left atrial enlargement. Abdominal ultrasound shows nonmobile, echogenic foci measuring 5 mm with the gallbladder fundus, likely a nonmobile stone.   HOSPITAL COURSE: Please refer to H and P dictated by the admitting physician. The patient is a 59 year old African American female with breast cancer, who presented with nausea. She did have recent chemotherapy. She was evaluated in the ED and was treated with symptomatic control, given IV fluids, and her symptoms did improve. She also had ultrasound which just showed a possible gallstone or a polyp, but she did not have any abdominal pain.   At this point, is doing well and is stable for discharge.   DISCHARGE MEDICATIONS: Carvedilol 6.25, 1 tab p.o. b.i.d., amlodipine 10 daily, clonidine 0.1, 1 tab p.o. b.i.d., Rena-Vite 1 tab p.o. daily, apixaban 2.5, 1 tab p.o. b.i.d., fluticasone nasally 2 sprays daily, aspirin 81, 1  tab p.o. daily, tramadol 50 q. 6 p.r.n. for pain, promethazine 25, q. 6 p.r.n. for nausea, and Zofran 4 mg q. 6 p.r.n. for nausea, ranitidine 150, 1 tab p.o. b.i.d., Colace 100, 1 tab p.o. b.i.d. as needed for constipation, meclizine 25 mg 1 tab p.o. b.i.d. as needed for dizziness.   DIET: Renal diet.   ACTIVITY: As tolerated.   FOLLOWUP:  With primary MD in 1-2 weeks. The patient will continue hemodialysis as doing previously.   TIME SPENT: 35 minutes.    ____________________________ Lafonda Mosses Posey Pronto, MD shp:LT D: 03/10/2014 18:05:25 ET T: 03/10/2014 19:21:30 ET JOB#: [id number]  cc: Angles Trevizo H. Posey Pronto, MD, <Dictator> Alric Seton MD ELECTRONICALLY SIGNED 03/15/2014 15:54

## 2014-05-26 NOTE — H&P (Signed)
PATIENT NAME:  Jessica Duran, Jessica Duran MR#:  [id number] DATE OF BIRTH:  18-Sep-1955  DATE OF ADMISSION:  03/10/2014  REFERRING PHYSICIAN: Dr. Archie Balboa.  PRIMARY CARE PHYSICIAN: Dr. Megan Salon.   CHIEF COMPLAINT: Dizziness.   HISTORY OF PRESENT ILLNESS: This is a 59 year old is [place] American female with a past medical history of end-stage renal disease on hemodialysis via a dialysis catheter in the right upper chest, as well as history of CVA with residual left-sided weakness, presenting with dizziness described mainly as lightheadedness, which occurred about 3 days ago associated with nausea. Actually evaluated at [facility] and admitted at that time with concern mainly for her blood pressures. She was discharged on 03/09/2014. After this, her symptoms returned and worsened; once again dizziness mainly described as lightheadedness. However, does describe also having some blurred vision. Because of this, she decided to present to the hospital for further workup and evaluation. MRI performed the Emergency Department revealed acute cerebellar CVA. No further symptomatology.    REVIEW OF SYSTEMS:  CONSTITUTIONAL: Denies fevers, chills, fatigue, weakness.  EYES: Denies blurred vision. Positive for double vision. Denies any eye pain.  EARS, NOSE, THROAT: Denies tinnitus, ear pain, or hearing loss.  RESPIRATORY: Denies cough, wheeze or shortness of breath.  CARDIOVASCULAR: Denies chest pain, palpitations, edema.  GASTROINTESTINAL: Denies nausea, vomiting, diarrhea, or abdominal pain.  GENITOURINARY: Denies dysuria or hematuria.  ENDOCRINE: Denies nocturia or thyroid problems.  HEMATOLOGY AND LYMPHATIC: Denies easy bruising or bleeding.  SKIN: Denies rash or lesions.  MUSCULOSKELETAL: Denies pain in neck, back, shoulder, knees, hips, or arthritic symptoms.  NEUROLOGIC: Positive for weakness of the left side, which is chronic; however, denies any and numbness, headache, or dysarthria.  PSYCHIATRIC:  Denies anxiety or depressive symptoms.   Otherwise, full review of systems performed by me is negative.   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   SOCIAL HISTORY: Denies any alcohol, tobacco, drug usage. Uses a walker for ambulation.   FAMILY HISTORY: Positive for CVA.   ALLERGIES: HYDRALAZINE, ISOSORBIDE DINITRATE, LEVAQUIN, LISINOPRIL, OXYCODONE, PENICILLIN, PERCOCET, AS WELL AS TYLENOL.   HOME MEDICATIONS: Include aspirin 81 mg p.o. daily, tramadol 50 mg p.o. q.6 hours as needed for pain, clonidine 0.1 mg p.o. b.i.d., Eliquis 2.5 mg p.o. b.i.d., meclizine 25 mg p.o. b.i.d. as needed for dizziness, Zofran 4 mg p.o. q. 6 hours as needed for nausea/vomiting, Coreg 6.25 mg p.o. b.i.d., Norvasc 5 mg p.o. daily, ranitidine 150 mg p.o. b.i.d., Colace 100 mg p.o. b.i.d., Flonase 50 mcg inhalation 2 sprays to each nostril daily, multivitamin 1 tablet p.o. daily.   PHYSICAL EXAMINATION:  VITAL SIGNS: Temperature 98.1, heart rate of 78, respirations 18, blood pressure 157/47, saturating 99% on room air. Weight 63 kg, BMI 26.3.  GENERAL: Chronically ill-appearing African American female currently in no acute distress.  HEAD: Normocephalic, atraumatic.  EYES: Pupils equal, round, and reactive to light. Extraocular muscles intact. No scleral icterus.  MOUTH: Moist mucosal membrane. Dentition poor. No abscess noted.  EARS, NOSE, THROAT: Clear without exudates. No external lesions.  NECK: Supple. No thyromegaly. No nodules. No JVD.  PULMONARY: Clear to auscultation bilaterally without wheezes, rales, or rhonchi. No accessory muscle use. Good respiratory effort.  CHEST: Nontender to palpation.  CARDIOVASCULAR: S1,  S2, regular rate and  rhythm. No murmurs, rubs, or gallops. No edema. Pedal pulses 2+ bilaterally.  GASTROINTESTINAL: Soft, nontender, nondistended. No masses. Positive bowel sounds. No hepatosplenomegaly.  MUSCULOSKELETAL: No swelling, clubbing, or edema. Range of motion is limited in the left upper and lower extremities given weakness; otherwise, range of motion full in all extremities.  NEUROLOGIC: Cranial nerves II through XII intact. Strength on the left side 4/5 in comparison to the right side, 5/5. Gross weakness noted on the left side, which is chronic; however, no nystagmus present currently.  SKIN: No ulceration, lesions, rashes, or cyanosis. She has a tunnelled dialysis catheter on the right and a port on her left chest.  PSYCHIATRIC: Mood and affect within normal limits. The patient is awake, alert, oriented x 3. Insight and judgment intact.   LABORATORY DATA: Sodium 141, potassium 3.6, chloride 106, bicarbonate 29, BUN 22, creatinine 3.52, glucose 152. WBC is 3.1, hemoglobin 9, platelets of 201,000.   MRI performed which reveals a 1 cm acute infarction within the left middle cerebellar peduncle.   ASSESSMENT AND PLAN: A 59 year old [place] American female with a history of end-stage renal disease on hemodialysis, cerebrovascular accident with residual left-sided weakness, presenting with continuous dizziness.  1.  Acute cerebrovascular accident of the left middle cerebellar peduncle. Place on telemetry. Initiate aspirin and statin therapy. We will consult neurology. The patient is on Eliquis. If she has any further neurological changes will need a CT head STAT. We will check a transthoracic echocardiogram, carotid Dopplers in searching for further etiologies, as well as lipid panel. Permissive hypertension: Treat only blood pressure greater than 220/120. Given allergy, we will use labetalol if required.  2.  Hypertension, essential, permissive hypertension: As stated above. For the first 24  to 48 hours, we will continue her clonidine to avoid rebound hypertension and tachycardia.  3.  History of gastroesophageal reflux disease without esophagitis: H2-blockers.  4.  End-stage renal disease on hemodialysis dialysis, receives dialysis Tuesday, Thursday, Saturday. If she remains in the hospital long enough we get a nephrology consult. We will hold for now.  5.  Upper extremity deep vein thrombosis. Continue Eliquis as this was diagnosed less than 2 months ago. She will require at least 6 months of anticoagulation.  6.  Venous thromboembolism therapy: Eliquis.   CODE STATUS: The patient is a FULL CODE.   TIME SPENT: 45 minutes.    ____________________________ Aaron Mose. Hower, MD dkh:ts D: 03/10/2014 21:20:52 ET T: 03/10/2014 22:08:49 ET JOB#: [id number]  cc: Aaron Mose. Hower, MD, <Dictator> DAVID Woodfin Ganja MD ELECTRONICALLY SIGNED 03/11/2014 20:45

## 2014-05-26 NOTE — Op Note (Signed)
PATIENT NAME:  Jessica Duran, Jessica Duran MR#:  [id number] DATE OF BIRTH:  1955/06/04  DATE OF PROCEDURE:  05/03/2014  PREOPERATIVE DIAGNOSES:  1.  Complication of dialysis device with inability to cannulate.  2.  End-stage renal disease requiring hemodialysis.  3.  Hypertension.   POSTOPERATIVE DIAGNOSES:  1.  Complication of dialysis device with inability to cannulate.  2.  End-stage renal disease requiring hemodialysis.  3.  Hypertension.  PROCEDURES PERFORMED:  1.  Contrast injection, left wrist fistula.  2.  Percutaneous transluminal angioplasty to 14 mm, left innominate vein.  3.  Percutaneous transluminal angioplasty of the arterial anastomosis to 5 mm.   SURGEON: Katha Cabal, MD   SEDATION: Versed plus fentanyl.   CONTRAST USED: Isovue 60 mL.   FLUOROSCOPY TIME: 4.5 minutes.   ACCESS:  1.  A 6 French sheath, antegrade direction, left wrist fistula.  2.  A 6 French sheath, retrograde direction, left wrist fistula.   INDICATIONS: Jessica Duran is a 59 year old woman was referred from her dialysis center not having had dialysis for the last 4 days secondary to cannulation issues and inability to get adequate flow. Risks and benefits for angiography of the fistula as well as interventions are reviewed. All questions are answered. The patient agrees to proceed.   DESCRIPTION OF PROCEDURE: The patient is taken to special procedures and placed in the supine position. After adequate sedation is achieved, she is positioned supine with her left arm extended palm upward. The left arm is prepped and draped in sterile fashion. Appropriate timeout is called.   Lidocaine 1% is infiltrated in the soft tissues and access to the fistula is obtained near the arterial anastomosis in an antegrade direction. Microwire followed by micro sheath, J-wire followed by a 6 French sheath. Hand injection of contrast is then used to demonstrate the fistula as well as the upper arm veins and the central veins.  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 Therefore, 3000 units of heparin are given and a Magic Torque wire is advanced through the lesion and centrally and down into the inferior vena cava. Initially, a 10 x 4 balloon is utilized and then a 12, and ultimately a 14 x 6 balloon is used to angioplasty. This does yield an improved result, although clearly there still remains a 40%-50% stricture stenosis. This appears to be associated with the Infuse-a-Port, which is in place and, therefore, stenting is not an option. However, flow was clearly improved with significant luminal gain.   The pursestring suture is then used to remove the antegrade sheath. Lidocaine 1% is infiltrated in the soft tissues overlying the palpable fistula more proximally on the forearm and a retrograde puncture is performed using the microneedle, microwire followed by micro sheath, J-wire followed by a 6 French sheath. Wire is then advanced out into the distal radial artery. Hand injection of contrast demonstrates there is greater than 90% narrowing associated with the arterial anastomosis and initially a 3 x 6 balloon, subsequently a 4 x 6 balloon, and then a 5 x 4 balloon are used to serially dilate the arterial portion. Inflations are for 1-2 minutes at 10-12 atmospheres each.   Followup injection demonstrates a marked improvement with what appears to be a uniform 4 mm segment.   Pursestring suture is placed around the sheath. The sheath is removed, light pressure is held, and there are no immediate complications. By examination, the thrill is now much improved with what appears to  be better flow by palpation.   INTERPRETATION: Initial views of the fistula demonstrate previously placed Viabahn stents through the midportion. The veins of the upper extremity including the basilic and brachial veins are widely patent. There is poor visualization of a cephalic vein in the upper arm. The  axillary and subclavian veins are widely patent. Infuse-a-Port is noted and as the Infuse-a-Port crosses from the jugular confluence through the innominate there appears to be a high-grade narrowing or stenosis. Superior vena cava is patent.   Following angioplasty to a maximum of 14 mm in the innominate, there is significant improvement. It is not optimal; however, stenting cannot be performed given the location of her permacath.   On imaging of the arterial anastomosis, there is greater than 90% narrowing associated with the arterial anastomosis in proximal several centimeters of vein. This is treated with serial dilatation to a maximum of 5 mm with an excellent result.   SUMMARY: Successful salvage of left radiocephalic fistula.    ____________________________ Katha Cabal, MD ggs:bm D: 05/04/2014 17:24:16 ET T: 05/05/2014 02:19:47 ET JOB#: [id number]  cc: Katha Cabal, MD, <Dictator> Katha Cabal MD ELECTRONICALLY SIGNED 05/07/2014 15:13

## 2014-05-26 NOTE — H&P (Signed)
PATIENT NAME:  Jessica Duran, Jessica Duran MR#:  [id number] DATE OF BIRTH:  [birth year]  DATE OF ADMISSION:  03/08/2014  REFERRING PHYSICIAN: Lurline Hare, MD  PRIMARY CARE PHYSICIAN: Rogue Jury, MD   ADMIT DIAGNOSIS: Hypertensive urgency and nausea.   HISTORY OF PRESENT ILLNESS: This is a 59 year old African American female who presents to the Emergency Department complaining of intractable nausea and vomiting. In the Emergency Department, the patient was found to have systolic blood pressures more than 220. When asked if the patient had taken her medicines today, she said she did, but likely threw up the pills when she was feeling nauseous. In the Emergency Department, she was given labetalol which brought her systolic blood pressure down to 190. It continued to trend downward, but the patient continued to complain of nausea which prompted the Emergency Department to call for admission.   REVIEW OF SYSTEMS: CONSTITUTIONAL: The patient denies fever, but admits to chills. This is common for her after dialysis.  ENT: Denies tinnitus or sore throat. EYES: Denies blurred vision or inflammation.  RESPIRATORY: Denies cough or shortness of breath.  CARDIOVASCULAR: Denies chest pain, palpitations, orthopnea, or paroxysmal nocturnal dyspnea.  GASTROINTESTINAL: Admits to nausea and vomiting, but denies diarrhea or abdominal pain.  GENITOURINARY: Denies dysuria, increased frequency, or hesitancy of urination.  ENDOCRINE: Denies polyuria or polydipsia.  HEMATOLOGIC AND LYMPHATIC: Denies easy bruising or bleeding.  INTEGUMENT: Denies rashes or lesions.  MUSCULOSKELETAL: Denies arthralgias or myalgias.  NEUROLOGIC: Denies numbness in extremities or dysarthria.  PSYCHIATRIC: Denies depression or suicidal ideation.   PAST MEDICAL HISTORY: Hypertension, ESRD on dialysis, CVA x2, current DVT in the right upper extremity, as well as breast cancer for which she is currently undergoing chemotherapy.   PAST SURGICAL  HISTORY: Appendectomy, C-section x3, vascular catheter placement as well as port placement.   SOCIAL HISTORY: The patient does not smoke or drink. She admits to a history of drug abuse 5 years ago.   FAMILY HISTORY: The patient's father is deceased of a cerebrovascular accident and she has an aunt who is deceased from complications of diabetes.   MEDICATIONS: 1.  Amlodipine 10 mg 1 tablet p.o. daily. 2.  Apixaban 2.5 mg 1 tablet p.o. b.i.d.  3.  Aspirin 81 mg 1 tab p.o. daily.  4.  Carvedilol 6.25 mg 1 tablet p.o. b.i.d.  5.  Clonidine 0.1 mg 1 tablet p.o. b.i.d.  6.  Dexamethasone 4 mg 2 tablets p.o. at bedtime the night before chemotherapy.  7.  Famotidine 20 mg 1 tab p.o. daily.  8.  Fluticasone nasal spray 2 sprays once a day to each nostril.  9.  Furosemide 40 mg 1 tablet p.o. daily as needed for leg swelling.  10.  Magnesium hydroxide 8% oral solution 30 mL p.o. t.i.d.  11.  Melatonin 3 mg 1 tablet p.o. at bedtime.  12.  Multivitamin 1 tablet p.o. daily.  13.  Ondansetron 4 mg 1 tablet p.o. every 6 hours as needed for nausea and vomiting.  14. Prochlorperazine 10 mg 1 tablet p.o. t.i.d.  15.  Promethazine 12.5 mg rectal suppository every 6 hours as needed for nausea and vomiting.  16.  Rena-Vite vitamin B complex with C and folic acid 1 tablet p.o. daily.  17.  Tramadol 50 mg 1 tablet p.o. every 6 hours as needed for pain.  18.  Zolpidem 5 mg 1 tablet p.o. at bedtime as needed for insomnia.   PERTINENT LABORATORY RESULTS AND RADIOGRAPHIC FINDINGS: Serum glucose 153, BUN 9, creatinine 1.72,  serum sodium 137, potassium 3.4, chloride 101, bicarb 29, calcium 8.2. Lipase 171. Serum albumin 3, alk phos 74, AST 50, ALT 58. White blood cell count is 2.4 (previously 3.5), hemoglobin 10.5, hematocrit 32.6 and MCV 91. Urinalysis is negative for infection.   There is no imaging.   PHYSICAL EXAMINATION: VITAL SIGNS: Temperature is 98.2, pulse 79, respirations 22, blood pressure 162/75 and  pulse ox is 98% on room air.  GENERAL: The patient is alert and oriented x3, in no apparent distress, but she admits to feeling very cold and is wrapped in multiple blankets.  HEENT: Normocephalic, atraumatic. Pupils equal, round, and reactive to light and accommodation. Extraocular movements are intact. Mucous membranes are moist.  NECK: Trachea is midline. No adenopathy. Thyroid is nonpalpable and nontender.  CHEST: Symmetric, atraumatic.  CARDIOVASCULAR: Regular rate and rhythm. Normal S1, S2. No rubs, clicks, or murmurs appreciated.  LUNGS: Clear to auscultation bilaterally. Normal effort and excursion.  ABDOMEN: Positive bowel sounds. Soft, nontender, nondistended. No hepatosplenomegaly.  GENITOURINARY: Deferred.  MUSCULOSKELETAL: The patient moves all 4 extremities equally, but I have not seen her walk.  SKIN: Warm and dry. No rashes or lesions.  EXTREMITIES: No clubbing, cyanosis, or edema.  NEUROLOGIC: Cranial nerves II through XII are grossly intact.  PSYCHIATRIC: Mood is normal. Affect is congruent.   ASSESSMENT AND PLAN: This is a 59 year old female admitted for hypertensive urgency.  1.  Hypertensive urgency. It is currently improving. The patient may have had some nausea and vomiting secondary to some transient hypertensive encephalopathy or perhaps she has some disequilibrium syndrome that may be causing these symptoms. She has been given labetalol in the Emergency Department, which is gradually bringing her pressure down. She also likely vomited most of her medications, which we will dose again for today. We will try to manage her nausea and vomiting symptomatically.  2.  Nausea. This is a chronic complaint. Etiology is unclear, but at this time the patient does not have signs or symptoms of septicemia. Her temperature is normal despite her subjective chills. She has chronic leukopenia secondary to end-stage renal disease given that this is an immunocompromised state; however, she is  nontoxic in appearance. We will monitor for signs or symptoms of sepsis.  3.  Endstage renal disease. The patient undergoes dialysis on Tuesday, Thursdays and Saturdays. Currently her creatinine and potassium are within acceptable limits.  4.  Deep vein thrombosis in her upper arm. We will continue with apixaban.  5.  Obesity. The patient's BMI is 25.8. I have encouraged diet and exercise.  6.  Gastrointestinal prophylaxis. We will give the patient H2-blocker b.i.d.   CODE STATUS: The patient is a FULL code.   TIME SPENT ON ADMISSION ORDERS AND PATIENT CARE: Approximately 35 minutes.  ____________________________ Norva Riffle. Marcille Blanco, MD msd:sb D: 03/08/2014 07:46:54 ET T: 03/08/2014 08:17:23 ET JOB#: [id number]  cc: Norva Riffle. Marcille Blanco, MD, <Dictator> Norva Riffle Florice Hindle MD ELECTRONICALLY SIGNED 03/10/2014 22:01

## 2014-05-26 NOTE — Discharge Summary (Signed)
PATIENT NAME:  Jessica Duran, Jessica Duran MR#:  [id number] DATE OF BIRTH:  1955-08-16  DATE OF ADMISSION:  03/10/2014 DATE OF DISCHARGE:  03/13/2014  ADMITTING PHYSICIAN: Aaron Mose. Hower, MD  DISCHARGING PHYSICIAN: Gladstone Lighter, MD   PRIMARY CARE PHYSICIAN: Rogue Jury, NP  [place]: 1.  Nephrology consultation by Tama High, MD.  2.  Neurology consultation by Mila Homer Tamala Julian, MD.  DISCHARGE DIAGNOSES:  1.  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  2.  End-stage renal disease on hemodialysis.  3.  Hypertension.  4.  Diabetes mellitus.  5.  Diabetic retinopathy.  6.  Constipation.  DISCHARGE HOME MEDICATIONS:  1.  Amlodipine 10 mg p.o. q. daily.  2.  Rena-Vite with vitamin B complex and vitamin C folic acid tablet, 1 tablet q. daily.  3.  Eliquis 2.5 mg p.o. b.i.d.  4.  Promethazine 25 mg p.o. q. 6 hours p.r.n. for nausea, vomiting.  5.  Zofran 4 mg q. 6 hours p.r.n. for nausea or vomiting.  6.  Ranitidine 150 mg p.o. b.i.d.  7.  Flonase 2 sprays each nostril once a day.  8.  Tramadol 50 mg q. 6 hours p.r.n. for pain.  9.  Clonidine 0.2 mg p.o. b.i.d.  10.  Meclizine 25 mg p.o. b.i.d.  11.  Meclizine 25 mg p.o. b.i.d. p.r.n. for dizziness.  12.  Coreg 12.5 mg p.o. b.i.d.  13.  Glipizide 2.5 mg p.o. q. daily.  14.  Atorvastatin 20 mg p.o. q. daily.  15.  Plavix 75 mg p.o. q. daily.  16.  Senokot 50/8.6 mg, 2 tablets every 12 hours.  17.  MiraLax powder p.r.n. for constipation every day.   DISCHARGE HOME OXYGEN: None.   DISCHARGE DIET: Renal diet.   DISCHARGE ACTIVITY: As tolerated.  FOLLOWUP INSTRUCTIONS:  1.  Physical therapy.  2.  Eye patch alternating between both eyes, 4 hours on each eye.  3.  PCP followup in 1 to 2 weeks.  4.  Neurology followup in 3 months.  5.  Follow up for dialysis tomorrow on 03/14/2014.  6.  Ophthalmology followup for diplopia in 1 to 2 weeks.  LABORATORIES  AND IMAGING STUDIES PRIOR TO DISCHARGE: 1.  Sodium 142, potassium 4.0, chloride 106, bicarbonate 28, BUN 26, creatinine 3.97, glucose 109, and calcium of 8.5.  2.  LDL cholesterol 87, HDL 55, triglycerides 177, total cholesterol 177. 3.  KUB done for abdominal pain and constipation showing no obstruction, no ileus, moderate stool in colon noted.  4. WBC is 3.7, hemoglobin 9.5, hematocrit 30.0, platelet count is 210,000. 5.  Ultrasound Dopplers of the carotids showing chronic nonocclusive right IJ thrombus noted. 6.  Echo Doppler showing LV ejection fraction is 60% to 65%, impaired relaxation of LV diastolic filling noted. 7.  MRI of the brain without contrast showing 1 cm acute infarction, left middle cerebellar peduncle and extensive old ischemic changes throughout the brain have been noted.  BRIEF HOSPITAL COURSE: Ms. Jessica Duran is a 59 year old African American female with past medical history significant for hypertension, diabetes, end-stage renal disease, prior CVA, who was in the hospital for nausea and vomiting from 03/08/2014 to 03/09/2014, got discharged. She states she went home, nausea got worse, she was actually dizzy to the point that she could not even get up, presents back to the hospital.  1.  Acute cerebellar cerebrovascular accident presenting with dizziness, nausea, and also some diplopia. MRI positive for left middle cerebellar peduncle infarct.  Seen by neurology, Dr. Valora Corporal, who recommended eye patch for diplopia alternating between both eyes; however, he also feels part of the diplopia is from diabetic retinopathy, and outpatient ophthalmology followup recommended. The patient was already on aspirin as an outpatient, changed over to Plavix along with her low-dose Eliquis for her right IJ clot. Goal blood pressure is to get her blood pressure 130/80 or less. Meclizine has been started to help with symptomatic dizziness. Neurology followup in 3 months recommended at this  time. 2.  End-stage renal disease on Tuesday, Thursday, Saturday hemodialysis. Seen by nephrology in the hospital, had last dialysis on 03/12/2014 and next dialysis as outpatient on 03/14/2014. 3.  Hypertension. Home medications have been adjusted. She is on Norvasc, clonidine, and Coreg.  4.  Diabetes mellitus. Low-dose glipizide has been started. 5.  Constipation. Has significant abdominal pain and distention. Started on multiple laxatives. KUB showed only moderate stool without any obstruction; finally had a bowel movement. Is being discharged on Senokot and MiraLax p.r.n. at this time.   Her course has been otherwise uneventful in the hospital.   DISCHARGE CONDITION: Stable.   DISCHARGE DISPOSITION: Short-term rehabilitation as recommended by physical therapy.   CODE STATUS: Full code.  TIME SPENT ON DISCHARGE: 45 minutes.   ____________________________ Gladstone Lighter, MD rk:ST D: 03/13/2014 11:54:57 ET T: 03/13/2014 12:13:11 ET JOB#: [id number]  cc: Gladstone Lighter, MD, <Dictator> Rogue Jury, NP Tama High, MD Gladstone Lighter MD ELECTRONICALLY SIGNED 04/11/2014 16:54

## 2014-05-26 NOTE — Consult Note (Signed)
Reason for Visit: This 59 year old Female patient presents to the clinic for initial evaluation of  breast cancer .   Referred by Dr Ma Hillock.  Diagnosis:  Chief Complaint/Diagnosis   59 year old female with stage I (T1c NX M0) triple negative invasive ductal carcinoma of the right breast status post wide local excision no lymph nodes examined status post adjuvant chemotherapy. Patient is multiple comorbidities including renal dialysis recent CVA.  Pathology Report pathology report reviewed   Imaging Report mammograms reviewed   Referral Report clinical notes reviewed   Planned Treatment Regimen whole breast and peripheral lymphatic radiation   HPI   patient is a 59 year old female with history of substance abuse who presented with an abnormal mammogram of her right breaststatus post biopsy positive for ductal carcinoma in situ. She underwent a wide local excision which revealed a 1.5 cm area of invasive mammary carcinoma triple negative. Margins were clear but close for both DCIS and invasive component at less than 1 mm. No axillary lymph nodes were studied she did have ultrasound of her axilla showing smooth homogeneous cortical Mantle of a 1.8 cm node consistent with non-metastatic lymphadenopathy. Fatty hilum and hilar vascular structures were preserved. Patient has been treated withCytoxan and Adriamycin with significant neutropenia.she is also received 10 of 12 cycles of Taxol this is being discontinued this time secondary to multiple comorbidities and most likely new CVA. She also hasocclusive thrombus in the right internal jugular which has improved on anticoagulant therapy. Patient's comorbidities also includeCVA 3 renal dialysis for end-stage renal disease. She is seen today for consideration of radiation therapy. Again no axillary lymph nodes were dissected based on his Sumption this was DCIS.patient is currently having diplopia in her right eye secondary to recent CVA  Past Hx:     blood clot: right arm, Nov 2015   CVA:    hyperparathyroidism:    Breast Cancer:    CVA/Stroke:    Diabetes Mellitus, Type II (NIDD):    Hypertension:    Excision right breast mass: May 2015   Appendectomy:   Past, Family and Social History:  Past Medical History positive   Cardiovascular hypertension   Endocrine diabetes mellitus; hyperparathyroidism   Neurological/Psychiatric CVA; CVA ??3   Past Surgical History appendectomy   Past Medical History Comments multiple blood clots   Family History positive   Family History Comments mother with pancreatic cancer at age 38   Social History positive   Social History Comments prior history of crack cocaine use 40-pack-year smoking history continues to smoke   Allergies:   Oxycodone: Hallucinations  Percocet 10/325: Hallucinations  Tylenol: Headaches  Levaquin: Pain  Penicillin: Other  Hydralazine: Other  Isosorbide Dinitrate: Other  Lisinopril: Other  Home Meds:  Home Medications: Medication Instructions Status  glipiZIDE 2.5 mg oral tablet, extended release 1 tab(s) orally once a day Active  traMADol 50 mg oral tablet 1 tab(s) orally every 6 hours, As Needed - for Pain Active  cloNIDine 0.2 mg oral tablet 1 tab(s) orally 2 times a day Active  meclizine 25 mg oral tablet 1 tab(s) orally 2 times a day Active  meclizine 25 mg oral tablet 1 tab(s) orally 2 times a day, As needed, Dizziness Active  atorvastatin 20 mg oral tablet 1 tab(s) orally once a day Active  clopidogrel 75 mg oral tablet 1 tab(s) orally once a day Active  docusate-senna 50 mg-8.6 mg oral tablet 2 tab(s) orally every 12 hours Active  polyethylene glycol 3350 oral powder for reconstitution  17 gram(s) orally once a day, As Needed - for Constipation Active  ondansetron 4 mg oral tablet 1 tab(s) orally every 6 hours as needed for nausea or vomiting. Active  ranitidine 150 mg oral capsule 1 cap(s) orally 2 times a day Active  promethazine 25 mg  oral tablet 1 tab(s) orally every 6 hours, As Needed - for Nausea, Vomiting Active  apixaban 2.5 mg oral tablet 1 tab(s) orally 2 times a day Active  amLODIPine 10 mg oral tablet 1 tab(s) orally once a day (in the morning) Active  Rena-Vite Vitamin B Complex with C and Folic Acid oral tablet 1 tab(s) orally once a day Active  fluticasone nasal 50 mcg/inh nasal spray 2 spray(s) into each nostril once a day Active   Review of Systems:  General negative   Performance Status (ECOG) 1   Skin negative   Breast see HPI   Ophthalmologic see HPI   ENMT negative   Respiratory and Thorax negative   Cardiovascular negative   Gastrointestinal negative   Genitourinary negative   Musculoskeletal negative   Neurological negative   Psychiatric negative   Hematology/Lymphatics negative   Endocrine negative   Allergic/Immunologic negative   Nursing Notes:  Nursing Vital Signs and Chemo Nursing Nursing Notes: *CC Vital Signs Flowsheet:   26-Feb-16 10:17  Temp Temperature 99.5  Pulse Pulse 80  Respirations Respirations 18  SBP SBP 146  DBP DBP 67  Pain Scale (0-10)  0  Height (cm) centimeters 157   Physical Exam:  General/Skin/HEENT:  General normal   Skin normal   ENMT normal   Head and Neck normal   Additional PE well-developed about female in NAD. She has a patch over her right eye for diplopia. She status post wide local excision the right breast. Incision is healed well. No dominant mass or nodularity is noted in either breast in 2 positions examined. No axillary or supraclavicular adenopathy is appreciated.Abdomen is benign.   Breasts/Resp/CV/GI/GU:  Respiratory and Thorax normal   Cardiovascular normal   Gastrointestinal normal   Genitourinary normal   MS/Neuro/Psych/Lymph:  Musculoskeletal normal   Lymphatics normal   Relevent Results:   Relevant Scans and Labs mammograms are reviewedMRI scans are reviewed showing cerebellar infarct.    Assessment and Plan: Impression:   stage I invasive mammary carcinoma the right breast status post wide excision no central lymph node or lymph nodes examined based on his initial presumption this was DCIS. Tumor is triple negative axillary ultrasonography does not demonstrate any pathologic nodes. Plan:   I discussed the case personally with medical oncology. I would incorporate her right breast and peripheral lymphatics up to 5000 cGy. Would treat her axilla and super clavicular nodes to sterilize any potentialdisease in this area based on the fact no sentinel lymph node was examined. Risks and benefits of treatment including skin reaction fatigue possible lymphedema for right upper extremity, inclusion of some superficial lung, alteration of blood counts all were discussed in detail with the patient. She seems to comprehend my treatment plan well. I have set up and ordered CT simulation for her for next week. She is completed all of her chemotherapy at this time and is received 90% of her dose of Taxol. We'll not be a candidate for aromatase inhibitor therapy based on the triple negative nature of her disease.  I would like to take this opportunity for allowing me to participate in the care of your patient..  Electronic Signatures: Armstead Peaks (MD)  (Signed [phone]  11:14)  Authored: HPI, Diagnosis, Past Hx, PFSH, Allergies, Home Meds, ROS, Nursing Notes, Physical Exam, Relevent Results, Encounter Assessment and Plan   Last Updated: 26-Feb-16 11:48 by Armstead Peaks (MD)

## 2014-05-26 NOTE — Consult Note (Signed)
Referring Physician:  Lytle Butte   Primary Care Physician:  [facility], [address] [place], [place] [address], [place] [phone]  Reason for Consult: Admit Date: 10-Mar-2014  Chief Complaint: dizziness  Reason for Consult: CVA   History of Present Illness: History of Present Illness:   seen at the request of Dr. Tressia Miners for stroke;  59 yo RHD F presents to [facility] secondary to persistent dizziness.  Pt denies vertigo or N/C.  Dizziness has occured for the past 3 days and does not worsen with movement.  Pt denies previous symptoms.  Pt reports baseline L hemiparesis from previous stroke.   Pt states that she has some double vision too.  Pt has problems walking due to dizziness.  No headaches, new weakness or numbness noted.  ROS:  General denies complaints   HEENT no complaints   Lungs no complaints   Cardiac no complaints   GI nausea   GU no complaints   Musculoskeletal no complaints   Skin no complaints   Neuro dizziness   Endocrine no complaints   Psych no complaints   Past Medical/Surgical Hx:  blood clot: right arm  CVA:   hyperparathyroidism:   Breast Cancer:   CVA/Stroke:   Diabetes Mellitus, Type II (NIDD):   Hypertension:   Excision right breast mass:   Appendectomy:   Past Medical/ Surgical Hx:  Past Medical History reviewed by me as above   Past Surgical History reviewed by me as above   Home Medications: Medication Instructions Last Modified Date/Time  ondansetron 4 mg oral tablet 1 tab(s) orally every 6 hours as needed for nausea or vomiting. 14-Feb-16 17:38  ranitidine 150 mg oral capsule 1 cap(s) orally 2 times a day 14-Feb-16 17:38  promethazine 25 mg oral tablet 1 tab(s) orally every 6 hours, As Needed - for Nausea, Vomiting 14-Feb-16 17:38  meclizine 25 mg oral tablet 1 tab(s) orally 2 times a day, As Needed, dizziness , As needed, dizziness 14-Feb-16 17:38  docusate sodium 100 mg oral capsule 1  cap(s) orally 2 times a day , As needed, Stool Softener 14-Feb-16 17:38  traMADol 50 mg oral tablet 1 tab(s) orally every 6 hours, As Needed - for Pain 14-Feb-16 17:38  aspirin 81 mg oral delayed release tablet 1 tab(s) orally once a day 14-Feb-16 17:38  apixaban 2.5 mg oral tablet 1 tab(s) orally 2 times a day 14-Feb-16 17:38  carvedilol 6.25 mg oral tablet 1 tab(s) orally 2 times a day 14-Feb-16 17:38  amLODIPine 10 mg oral tablet 1 tab(s) orally once a day (in the morning) 14-Feb-16 17:38  cloNIDine 0.1 mg oral tablet 1 tab(s) orally 2 times a day 14-Feb-16 17:38  Rena-Vite Vitamin B Complex with C and Folic Acid oral tablet 1 tab(s) orally once a day 14-Feb-16 17:38  fluticasone nasal 50 mcg/inh nasal spray 2 spray(s) into each nostril once a day 14-Feb-16 17:38   Allergies:  Oxycodone: Hallucinations  Percocet 10/325: Hallucinations  Tylenol: Headaches  Levaquin: Pain  Penicillin: Other  Hydralazine: Other  Isosorbide Dinitrate: Other  Lisinopril: Other  Allergies:  Allergies as above reviewed by me   Social/Family History: Employment Status: disabled  Lives With: alone  Living Arrangements: house  Social History: no tob, no EtOH, no illicits  Family History: + stroke, no seizures   Vital Signs: **Vital Signs.:   15-Feb-16 19:43  Vital Signs Type Routine  Temperature Temperature (F) 98.7  Celsius 37  Temperature Source oral  Pulse  Pulse 80  Respirations Respirations 19  Systolic BP Systolic BP 993  Diastolic BP (mmHg) Diastolic BP (mmHg) 79  Mean BP 101  Pulse Ox % Pulse Ox % 97  Pulse Ox Activity Level  At rest  Oxygen Delivery Room Air/ 21 %   Physical Exam: General: obese, NAD  HEENT: normocephalic, sclera nonicteric, oropharynx clear  Neck: supple, no JVD, no bruits  Chest: CTA B, no wheezing, good movement  Cardiac: RRR, no murmurs, no edema, 2+ pulses  Extremities: no C/C/E, FROM   Neurologic Exam: Mental Status: alert but has significant scanning  speech, follows simple commands  Cranial Nerves: PERRLA, EOMI but complains of diplopia, nl VF, face symmetric, tongue midline, shoulder shrug equal  Motor Exam: 3/5 L, 5 /5 R, nl tone, no tremor  Deep Tendon Reflexes: 0+/4 B, L babinski, R mute plantar  Sensory Exam: pinprick, temperature, and vibration intact B  Coordination: L dysmmetria out of proportion to weakness   Lab Results: LabObservation:  15-Feb-16 07:50   OBSERVATION Reason for Test   Radiology Results: [place]:    15-Feb-16 08:51, US Carotid Doppler Bilateral  US Carotid Doppler Bilateral   REASON FOR EXAM:    cva  COMMENTS:       PROCEDURE: [place]  - US CAROTID DOPPLER BILATERAL  - Mar 11 2014  8:51AM     CLINICAL DATA:  Stroke symptoms, hypertension, syncope, visual  disturbance.    EXAM:  BILATERAL CAROTID DUPLEX ULTRASOUND    TECHNIQUE:  Pearline Cables scale imaging, color Doppler and duplex ultrasound were  performed of bilateral carotid and vertebral arteries in the neck.  COMPARISON:  01/15/2014    FINDINGS:  Criteria: Quantification of carotid stenosis is based on velocity  parameters that correlate the residual internal carotid diameter  with NASCET-based stenosis levels, using the diameter of the distal  internal carotid lumen as the denominator for stenosis measurement.    The following velocity measurements were obtained:    RIGHT    ICA:  81/23 cm/sec    CCA:  57/0 cm/sec  SYSTOLIC ICA/CCA RATIO:  1.0    DIASTOLIC ICA/CCA RATIO:  2.7    ECA:  68 cm/sec    LEFT    ICA:  74/22 cm/sec    CCA:  17/79 cm/sec    SYSTOLIC ICA/CCA RATIO:  1.1    DIASTOLIC ICA/CCA RATIO:  1.6  ECA:  95 cm/sec    RIGHT CAROTID ARTERY: Minor echogenic shadowing plaque formation. No  hemodynamically significant right ICA stenosis, velocity elevation,  or turbulent flow. Degree of narrowing less than 50%.    RIGHT VERTEBRAL ARTERY:  Antegrade    LEFT CAROTID ARTERY: Similar scattered minor echogenic plaque  formation.  No hemodynamically significant left ICA stenosis,  velocity elevation, or turbulent flow.    LEFT VERTEBRAL ARTERY:  Antegrade    Stable chronic nonocclusive right IJ thrombus again demonstrated.   IMPRESSION:  Minor carotid atherosclerosis. No hemodynamically significant ICA  stenosis by ultrasound. Degree of narrowing less than 50%  bilaterally. No significant change.    Chronic nonocclusive right IJ thrombus again demonstrated.      Electronically Signed    By: Jerilynn Mages.  Shick M.D.    On: 03/11/2014 09:20         Verified By: Earl Gala, M.D.,  MRI:    14-Feb-16 19:58, MRI Brain Without Contrast  MRI Brain Without Contrast   REASON FOR EXAM:    dizziness  COMMENTS:  PROCEDURE: MR  - MR BRAIN WO CONTRAST  - Mar 10 2014  7:58PM     CLINICAL DATA:  Dizziness of acute onset since this morning.  Personal history of previous stroke.    EXAM:  MRI HEAD WITHOUT CONTRAST    TECHNIQUE:  Multiplanar, multiecho pulse sequences of the brain and surrounding  structures were obtained without intravenous contrast.  COMPARISON:  MRI brain 01/15/2014    FINDINGS:  There is a 1 cm acute infarction within the left middle cerebellar  peduncle. No other acute infarction. There are extensive old  infarctions affecting the pons. There are old infarctions within the  cerebellum. There are old infarctions affecting the thalami, basal  ganglia and throughout the cerebral hemispheric white matter. There  is cortical gliosis in the right frontal region and at both temporal  tips. This could relate to old head injury or old cortical  infarctions. No evidence of neoplastic mass lesion, acute  hemorrhage, hydrocephalus or extra-axial collection. There are foci  of hemosiderin deposition scattered within areas of abnormal white  matter consistent with micro hemorrhages related old infarctions or  old head injury. No pituitary mass. No fluid in the sinuses. There  is a small amount of  fluid in the mastoid air cells on the right.     IMPRESSION:  1 cm acute infarction within the left middle cerebellar peduncle.    Extensive old ischemic changes throughout the brain as outlined  above. Additionally, there could be findings of old head trauma in  the right frontal lobe and both temporal tips.      Electronically Signed    By: Nelson Chimes M.D.    On: 03/10/2014 20:11       Verified By: Jules Schick, M.D.,   Radiology Impression: Radiology Impression: MRI of brain personally reviewed by me and shows L cerebellar peduncle small infarct, moderate subcortical white matter changes   Impression/Recommendations: Recommendations:   prior notes reviewed by me  reviewed by me   L cerebellar lacunar acute infarct-  symptomatic causing diplopia and dizziness;  etiology is likely uncontrolled HTN Moderate white matter changes-  likely due to HTN as well d/c ASA start plavix 75mg  daily check B12 and Hem A1c needs better BP control with goal < 130/80 start therapy start meclizine 12.5mg  BID scheduled for dizzines needs eye patch alternating eyes every 4 hours for diplopia will follow but pt can be discharged at anytime as long as she has Neurology f/u in 3 months  Electronic Signatures: Jamison Neighbor (MD)  (Signed 15-Feb-16 22:45)  Authored: REFERRING PHYSICIAN, Primary Care Physician, Consult, History of Present Illness, Review of Systems, PAST MEDICAL/SURGICAL HISTORY, HOME MEDICATIONS, ALLERGIES, Social/Family History, NURSING VITAL SIGNS, Physical Exam-, LAB RESULTS, RADIOLOGY RESULTS, Recommendations   Last Updated: 15-Feb-16 22:45 by Jamison Neighbor (MD)

## 2014-05-27 ENCOUNTER — Other Ambulatory Visit: Payer: Self-pay | Admitting: *Deleted

## 2014-05-27 ENCOUNTER — Other Ambulatory Visit: Payer: Self-pay | Admitting: Radiation Oncology

## 2014-05-27 ENCOUNTER — Ambulatory Visit: Admission: RE | Admit: 2014-05-27 | Payer: Medicare Other | Source: Ambulatory Visit

## 2014-05-27 ENCOUNTER — Encounter: Payer: Self-pay | Admitting: Neurology

## 2014-05-27 ENCOUNTER — Ambulatory Visit: Payer: Medicare Other | Admitting: Radiation Oncology

## 2014-05-27 ENCOUNTER — Ambulatory Visit
Admission: RE | Admit: 2014-05-27 | Discharge: 2014-05-27 | Disposition: A | Discharge status: Home / Self Care | Payer: Medicare Other | Source: Ambulatory Visit | Attending: Radiation Oncology | Admitting: Radiation Oncology

## 2014-05-27 ENCOUNTER — Ambulatory Visit: Payer: Medicaid Other | Admitting: Radiation Oncology

## 2014-05-27 DIAGNOSIS — I639 Cerebral infarction, unspecified: Secondary | ICD-10-CM | POA: Insufficient documentation

## 2014-05-27 DIAGNOSIS — N186 End stage renal disease: Secondary | ICD-10-CM | POA: Insufficient documentation

## 2014-05-27 DIAGNOSIS — E119 Type 2 diabetes mellitus without complications: Secondary | ICD-10-CM | POA: Insufficient documentation

## 2014-05-27 DIAGNOSIS — I10 Essential (primary) hypertension: Secondary | ICD-10-CM | POA: Insufficient documentation

## 2014-05-27 MED ORDER — SULFAMETHOXAZOLE-TRIMETHOPRIM 800-160 MG PO TABS: 1.0000 | ORAL_TABLET | Freq: Two times a day (BID) | ORAL | Status: DC

## 2014-05-27 MED ORDER — SULFAMETHOXAZOLE-TRIMETHOPRIM 800-160 MG PO TABS: 1.0000 | ORAL_TABLET | Freq: Two times a day (BID) | ORAL | Status: AC

## 2014-05-28 ENCOUNTER — Telehealth: Payer: Self-pay | Admitting: *Deleted

## 2014-05-28 ENCOUNTER — Ambulatory Visit
Admission: RE | Admit: 2014-05-28 | Discharge: 2014-05-28 | Disposition: A | Discharge status: Home / Self Care | Payer: Medicare Other | Source: Ambulatory Visit | Attending: Radiation Oncology | Admitting: Radiation Oncology

## 2014-05-28 ENCOUNTER — Ambulatory Visit: Payer: Medicare Other

## 2014-05-28 NOTE — Telephone Encounter (Signed)
Faxed recent office visit notes to [facility] at 7:23am on 05/28/14. Received fax confirmation.

## 2014-05-29 ENCOUNTER — Ambulatory Visit: Payer: Medicare Other

## 2014-05-29 ENCOUNTER — Ambulatory Visit
Admission: RE | Admit: 2014-05-29 | Discharge: 2014-05-29 | Disposition: A | Discharge status: Home / Self Care | Payer: Medicare Other | Source: Ambulatory Visit | Attending: Radiation Oncology | Admitting: Radiation Oncology

## 2014-05-29 NOTE — Telephone Encounter (Signed)
Records received from [facility] Jaynee Eagles requested 05/29/14.

## 2014-05-30 ENCOUNTER — Ambulatory Visit: Payer: Medicare Other

## 2014-05-30 DIAGNOSIS — C50911 Malignant neoplasm of unspecified site of right female breast: Secondary | ICD-10-CM | POA: Diagnosis not present

## 2014-05-30 DIAGNOSIS — Z171 Estrogen receptor negative status [ER-]: Secondary | ICD-10-CM | POA: Diagnosis not present

## 2014-05-30 DIAGNOSIS — Z510 Encounter for antineoplastic radiation therapy: Secondary | ICD-10-CM | POA: Diagnosis not present

## 2014-05-31 ENCOUNTER — Ambulatory Visit: Payer: Medicare Other

## 2014-05-31 ENCOUNTER — Other Ambulatory Visit: Payer: Self-pay | Admitting: *Deleted

## 2014-05-31 DIAGNOSIS — C50911 Malignant neoplasm of unspecified site of right female breast: Secondary | ICD-10-CM

## 2014-06-03 ENCOUNTER — Ambulatory Visit: Payer: Medicare Other

## 2014-06-04 ENCOUNTER — Ambulatory Visit
Admission: RE | Admit: 2014-06-04 | Discharge: 2014-06-04 | Disposition: A | Discharge status: Home / Self Care | Payer: Medicare Other | Source: Ambulatory Visit | Attending: Radiation Oncology | Admitting: Radiation Oncology

## 2014-06-04 ENCOUNTER — Ambulatory Visit: Payer: Medicare Other

## 2014-06-04 ENCOUNTER — Other Ambulatory Visit: Payer: Self-pay | Admitting: *Deleted

## 2014-06-04 DIAGNOSIS — Z95828 Presence of other vascular implants and grafts: Secondary | ICD-10-CM

## 2014-06-05 ENCOUNTER — Ambulatory Visit
Admission: RE | Admit: 2014-06-05 | Discharge: 2014-06-05 | Disposition: A | Discharge status: Home / Self Care | Payer: Medicare Other | Source: Ambulatory Visit | Attending: Radiation Oncology | Admitting: Radiation Oncology

## 2014-06-05 ENCOUNTER — Ambulatory Visit: Payer: Medicare Other

## 2014-06-06 ENCOUNTER — Encounter: Payer: Self-pay | Admitting: *Deleted

## 2014-06-06 ENCOUNTER — Emergency Department
Admission: EM | Admit: 2014-06-06 | Discharge: 2014-06-06 | Disposition: A | Discharge status: Home / Self Care | Payer: Medicare Other | Attending: Emergency Medicine | Admitting: Emergency Medicine

## 2014-06-06 ENCOUNTER — Ambulatory Visit: Payer: Medicare Other

## 2014-06-06 DIAGNOSIS — Z7951 Long term (current) use of inhaled steroids: Secondary | ICD-10-CM | POA: Diagnosis not present

## 2014-06-06 DIAGNOSIS — R112 Nausea with vomiting, unspecified: Secondary | ICD-10-CM

## 2014-06-06 DIAGNOSIS — Z79899 Other long term (current) drug therapy: Secondary | ICD-10-CM | POA: Insufficient documentation

## 2014-06-06 DIAGNOSIS — E119 Type 2 diabetes mellitus without complications: Secondary | ICD-10-CM | POA: Insufficient documentation

## 2014-06-06 DIAGNOSIS — I120 Hypertensive chronic kidney disease with stage 5 chronic kidney disease or end stage renal disease: Secondary | ICD-10-CM | POA: Diagnosis not present

## 2014-06-06 DIAGNOSIS — N186 End stage renal disease: Secondary | ICD-10-CM | POA: Diagnosis not present

## 2014-06-06 DIAGNOSIS — Z992 Dependence on renal dialysis: Secondary | ICD-10-CM | POA: Diagnosis not present

## 2014-06-06 DIAGNOSIS — D649 Anemia, unspecified: Secondary | ICD-10-CM | POA: Diagnosis not present

## 2014-06-06 DIAGNOSIS — Z87891 Personal history of nicotine dependence: Secondary | ICD-10-CM | POA: Diagnosis not present

## 2014-06-06 DIAGNOSIS — Z7982 Long term (current) use of aspirin: Secondary | ICD-10-CM | POA: Diagnosis not present

## 2014-06-06 DIAGNOSIS — Z880 Allergy status to penicillin: Secondary | ICD-10-CM | POA: Diagnosis not present

## 2014-06-06 LAB — URINALYSIS COMPLETE WITH MICROSCOPIC (ARMC ONLY)
BILIRUBIN URINE: NEGATIVE
Bacteria, UA: NONE SEEN
GLUCOSE, UA: NEGATIVE mg/dL
Hgb urine dipstick: NEGATIVE
Ketones, ur: NEGATIVE mg/dL
LEUKOCYTES UA: NEGATIVE
NITRITE: NEGATIVE
PROTEIN: 30 mg/dL — AB
Specific Gravity, Urine: 1.008 (ref 1.005–1.030)
pH: 8 (ref 5.0–8.0)

## 2014-06-06 LAB — CBC WITH DIFFERENTIAL/PLATELET
BASOS ABS: 0 10*3/uL (ref 0–0.1)
Basophils Relative: 1 %
EOS PCT: 3 %
Eosinophils Absolute: 0.1 10*3/uL (ref 0–0.7)
HEMATOCRIT: 23.6 % — AB (ref 35.0–47.0)
Hemoglobin: 7.6 g/dL — ABNORMAL LOW (ref 12.0–16.0)
Lymphocytes Relative: 19 %
Lymphs Abs: 0.6 10*3/uL — ABNORMAL LOW (ref 1.0–3.6)
MCH: 27.9 pg (ref 26.0–34.0)
MCHC: 32.2 g/dL (ref 32.0–36.0)
MCV: 86.7 fL (ref 80.0–100.0)
Monocytes Absolute: 0.6 10*3/uL (ref 0.2–0.9)
Monocytes Relative: 20 %
NEUTROS ABS: 1.7 10*3/uL (ref 1.4–6.5)
NEUTROS PCT: 57 %
PLATELETS: 137 10*3/uL — AB (ref 150–440)
RBC: 2.72 MIL/uL — ABNORMAL LOW (ref 3.80–5.20)
RDW: 17.3 % — AB (ref 11.5–14.5)
WBC: 3 10*3/uL — ABNORMAL LOW (ref 3.6–11.0)

## 2014-06-06 LAB — COMPREHENSIVE METABOLIC PANEL
ALBUMIN: 3.2 g/dL — AB (ref 3.5–5.0)
ALK PHOS: 46 U/L (ref 38–126)
ALT: 30 U/L (ref 14–54)
ANION GAP: 7 (ref 5–15)
AST: 41 U/L (ref 15–41)
BUN: 32 mg/dL — AB (ref 6–20)
CO2: 23 mmol/L (ref 22–32)
Calcium: 8.8 mg/dL — ABNORMAL LOW (ref 8.9–10.3)
Chloride: 104 mmol/L (ref 101–111)
Creatinine, Ser: 5.08 mg/dL — ABNORMAL HIGH (ref 0.44–1.00)
GFR calc Af Amer: 10 mL/min — ABNORMAL LOW (ref >60)
GFR calc non Af Amer: 8 mL/min — ABNORMAL LOW (ref >60)
Glucose, Bld: 64 mg/dL — ABNORMAL LOW (ref 65–99)
POTASSIUM: 5.5 mmol/L — AB (ref 3.5–5.1)
Sodium: 134 mmol/L — ABNORMAL LOW (ref 135–145)
TOTAL PROTEIN: 6.6 g/dL (ref 6.5–8.1)
Total Bilirubin: 0.2 mg/dL — ABNORMAL LOW (ref 0.3–1.2)

## 2014-06-06 LAB — TROPONIN I

## 2014-06-06 NOTE — ED Notes (Signed)
Pt given food and drink Gable Odonohue, Evelina Bucy, RN

## 2014-06-06 NOTE — ED Notes (Signed)
Pt states 0/10 pain, pt alert and oriented in no distress, pt states she has been nauseas for about 1 week and unable to go to dialysis

## 2014-06-06 NOTE — ED Notes (Signed)
Pt actively receiving radiation for breast cancer, last tx 2 weeks ago

## 2014-06-06 NOTE — Discharge Instructions (Signed)
You have not had any nausea in the emergency department today. We discussed your blood count which is low. We called Dr. Candiss Norse and spoke with him about this as well. He will call the dialysis center to help you get your dialysis today. Go directly to the dialysis center. Return to the emergency department if you have further nausea, if you have any weakness, or other urgent concerns.    Anemia, Nonspecific Anemia is a condition in which the concentration of red blood cells or hemoglobin in the blood is below normal. Hemoglobin is a substance in red blood cells that carries oxygen to the tissues of the body. Anemia results in not enough oxygen reaching these tissues.  CAUSES  Common causes of anemia include:   Excessive bleeding. Bleeding may be internal or external. This includes excessive bleeding from periods (in women) or from the intestine.   Poor nutrition.   Chronic kidney, thyroid, and liver disease.  Bone marrow disorders that decrease red blood cell production.  Cancer and treatments for cancer.  HIV, AIDS, and their treatments.  Spleen problems that increase red blood cell destruction.  Blood disorders.  Excess destruction of red blood cells due to infection, medicines, and autoimmune disorders. SIGNS AND SYMPTOMS   Minor weakness.   Dizziness.   Headache.  Palpitations.   Shortness of breath, especially with exercise.   Paleness.  Cold sensitivity.  Indigestion.  Nausea.  Difficulty sleeping.  Difficulty concentrating. Symptoms may occur suddenly or they may develop slowly.  DIAGNOSIS  Additional blood tests are often needed. These help your health care provider determine the best treatment. Your health care provider will check your stool for blood and look for other causes of blood loss.  TREATMENT  Treatment varies depending on the cause of the anemia. Treatment can include:   Supplements of iron, vitamin M09, or folic acid.   Hormone  medicines.   A blood transfusion. This may be needed if blood loss is severe.   Hospitalization. This may be needed if there is significant continual blood loss.   Dietary changes.  Spleen removal. HOME CARE INSTRUCTIONS Keep all follow-up appointments. It often takes many weeks to correct anemia, and having your health care provider check on your condition and your response to treatment is very important. SEEK IMMEDIATE MEDICAL CARE IF:   You develop extreme weakness, shortness of breath, or chest pain.   You become dizzy or have trouble concentrating.  You develop heavy vaginal bleeding.   You develop a rash.   You have bloody or black, tarry stools.   You faint.   You vomit up blood.   You vomit repeatedly.   You have abdominal pain.  You have a fever or persistent symptoms for more than 2-3 days.   You have a fever and your symptoms suddenly get worse.   You are dehydrated.  MAKE SURE YOU:  Understand these instructions.  Will watch your condition.  Will get help right away if you are not doing well or get worse. Document Released: 02/19/2004 Document Revised: 09/13/2012 Document Reviewed: 07/07/2012 [facility] Patient Information 2015 [place], [place]. This information is not intended to replace advice given to you by your health care provider. Make sure you discuss any questions you have with your health care provider.

## 2014-06-06 NOTE — ED Notes (Addendum)
Patient discharged and to go straight to dialysis.  Instructions given to family as well for patient to go to dialysis.

## 2014-06-06 NOTE — ED Provider Notes (Signed)
[facility] Emergency Department Provider Note  ____________________________________________  Time seen: 8:35 AM  I have reviewed the triage vital signs and the nursing notes.   HISTORY  Chief Complaint Nausea and Emesis  end stage renal disease, chronic  HPI Jessica Duran is a 59 y.o. female with end-stage renal disease and dialysis who has had nausea and vomiting earlier in the week. Because of this nausea and vomiting she missed her dialysis this past Saturday and Tuesday. She is feeling better now but discussed this with the dialysis center. They have sent her to the emergency department for medical clearance before her Thursday round of scheduled dialysis. He denies any abdominal pain both now and earlier in the week. She did not have any diarrhea. She reports currently that she is hungry.     Past Medical History  Diagnosis Date  . History of stroke   . Hypertension   . Chronic kidney disease     stage III  . Diastolic congestive heart failure     ef 55-60%  . Fibromyalgia   . DVT (deep venous thrombosis)   . Hyperparathyroidism   . Stroke   . Diabetes mellitus without complication   . Breast cancer     Triple Negative    Patient Active Problem List   Diagnosis Date Noted  . Cerebellar infarct 05/27/2014  . ESRD (end stage renal disease) 05/27/2014  . DM (diabetes mellitus) 05/27/2014  . HTN (hypertension) 05/27/2014    Past Surgical History  Procedure Laterality Date  . Appendectomy    . Cesarean section      x3  . Breast lumpectomy Right may 2015    Current Outpatient Rx  Name  Route  Sig  Dispense  Refill  . amLODipine (NORVASC) 10 MG tablet   Oral   Take 10 mg by mouth daily.         Mar[place] Kitchen apixaban (ELIQUIS) 2.5 MG TABS tablet   Oral   Take 2.5 mg by mouth 2 (two) times daily.         Marland Kitchen aspirin 81 MG tablet   Oral   Take 81 mg by mouth daily.         Marland Kitchen atorvastatin (LIPITOR) 20 MG tablet   Oral   Take 20 mg by  mouth daily.         Marland Kitchen b complex-vitamin c-folic acid (NEPHRO-VITE) 0.8 MG TABS tablet   Oral   Take 1 tablet by mouth at bedtime.         . bisacodyl (DULCOLAX) 5 MG EC tablet   Oral   Take 5 mg by mouth daily as needed for moderate constipation.         . carvedilol (COREG) 6.25 MG tablet   Oral   Take 12.5 mg by mouth daily.         . cloNIDine (CATAPRES) 0.2 MG tablet   Oral   Take 0.2 mg by mouth 2 (two) times daily.         . clopidogrel (PLAVIX) 75 MG tablet   Oral   Take 75 mg by mouth daily.         . fluticasone (FLONASE) 50 MCG/ACT nasal spray   Each Nare   Place 2 sprays into both nostrils daily.         Marland Kitchen gabapentin (NEURONTIN) 300 MG capsule   Oral   Take 300 mg by mouth daily.         Marland Kitchen glipiZIDE (  GLUCOTROL XL) 2.5 MG 24 hr tablet   Oral   Take 2.5 mg by mouth daily.         Marland Kitchen lidocaine (XYLOCAINE) 2 % solution   Mouth/Throat   Use as directed 10 mLs in the mouth or throat every 6 (six) hours as needed for mouth pain.         . Lidocaine-Prilocaine, Bulk, 2.5-2.5 % CREA   Does not apply   1 application by Does not apply route daily. Apply once a day every Tuesday, Thursday, Saturday for pain         . meclizine (ANTIVERT) 25 MG tablet   Oral   Take 25 mg by mouth every 12 (twelve) hours.      0   . Melatonin 3 MG TABS   Oral   Take 2 tablets by mouth daily.         . promethazine (PHENERGAN) 25 MG tablet   Oral   Take 25 mg by mouth every 6 (six) hours as needed for nausea or vomiting.         . senna-docusate (SENOKOT-S) 8.6-50 MG per tablet   Oral   Take 2 tablets by mouth every 12 (twelve) hours.         . simvastatin (ZOCOR) 20 MG tablet   Oral   Take 20 mg by mouth daily.         Marland Kitchen sulfamethoxazole-trimethoprim (BACTRIM DS,SEPTRA DS) 800-160 MG per tablet   Oral   Take 1 tablet by mouth 2 (two) times daily.   20 tablet   no   . traMADol (ULTRAM) 50 MG tablet   Oral   Take 50 mg by mouth every 12  (twelve) hours as needed.         . trolamine salicylate (ASPERCREME) 10 % cream   Topical   Apply 1 application topically as needed for muscle pain (Apply to left shoulder topically every day and evening shift for pain).         . Vitamin D, Ergocalciferol, (DRISDOL) 50000 UNITS CAPS capsule   Oral   Take by mouth.         . zolpidem (AMBIEN) 5 MG tablet   Oral   Take 5 mg by mouth at bedtime as needed for sleep.           Allergies Acetaminophen; Hydralazine; Isosorbide; Levaquin; Lisinopril; Oxycodone; Oxycodone-acetaminophen; and Penicillins  Family History  Problem Relation Age of Onset  . Hypertension Father   . Cancer Mother     Pancreatic    Social History History  Substance Use Topics  . Smoking status: Former Smoker    Types: Cigarettes    Quit date: 01/25/2013  . Smokeless tobacco: Not on file  . Alcohol Use: No    Review of Systems  Constitutional: Negative for fever. ENT: Negative for sore throat. Cardiovascular: Negative for chest pain. Respiratory: Negative for shortness of breath. Gastrointestinal: Positive for nausea and vomiting earlier in the week. None now. See history of present illness. Musculoskeletal: Negative for back pain. Skin: Negative for rash. Neurological: Negative for headaches   10-point ROS otherwise negative.  ____________________________________________   PHYSICAL EXAM:  VITAL SIGNS: ED Triage Vitals  Enc Vitals Group     BP 06/06/14 0703 125/105 mmHg     Pulse Rate 06/06/14 0703 86     Resp 06/06/14 0703 18     Temp 06/06/14 0703 98.9 F (37.2 C)     Temp Source 06/06/14 0703  Oral     SpO2 06/06/14 0703 98 %     Weight 06/06/14 0703 136 lb 9 oz (61.944 kg)     Height 06/06/14 0703 5\' 1"  (1.549 m)     Head Cir --      Peak Flow --      Pain Score --      Pain Loc --      Pain Edu? --      Excl. in [place]? --     Constitutional: Alert and oriented. Well appearing and in no distress. ENT   Head:  Normocephalic and atraumatic.   Nose: No congestion/rhinnorhea.   Mouth/Throat: Mucous membranes are moist. Cardiovascular: Normal rate, regular rhythm. Respiratory: Normal respiratory effort without tachypnea. Breath sounds are clear and equal bilaterally. No wheezes/rales/rhonchi. Gastrointestinal: Soft and nontender. No distention.  Back: There is no CVA tenderness. Musculoskeletal: Nontender with normal range of motion in all extremities.  No noted edema. Neurologic:  Normal speech and language. No gross focal neurologic deficits are appreciated.  Skin:  Skin is warm, dry. No rash noted. Psychiatric: Mood and affect are normal. Speech and behavior are normal.  Rectal exam: Nontender. Heme-negative.  ____________________________________________    LABS (pertinent positives/negatives)  The patient's hemoglobin level is 7.6. I do not have direct comparisons for that but I do have a comparison for her red blood cell count. The RBC is 2.72. This is lower then when last noted one month ago.  Metabolic panel shows BUNs of 32 with creatinine of 5. Potassium is 5.5. Glucose 64. ____________________________________________   EKG  At 7:26 AM sinus rhythm at a rate of 83 with normal intervals and a normal access.  ____________________________________________   ____________________________________________   PROCEDURES  Procedure(s) performed: None  Critical Care performed: No  ____________________________________________   INITIAL IMPRESSION / ASSESSMENT AND PLAN / ED COURSE  While the patient is feeling better and has an appetite with no nausea, I am concerned about her anemia. I will call her nephrologist and discuss whether she should receive dialysis in the hospital or whether she can receive dialysis at her dialysis center.  ----------------------------------------- 10:56 AM on 06/06/2014 -----------------------------------------  The patient has felt well in the  emergency department. She has had no nausea. Her hemoglobin level appears low. A rectal exam was performed and was heme negative. I called Dr. Candiss Norse, the patient's nephrologist. He agrees that the patient is stable for discharge and needs dialysis. He will call the dialysis center to facilitate her dialysis today. The patient agrees with this plan.  ____________________________________________   FINAL CLINICAL IMPRESSION(S) / ED DIAGNOSES  Final diagnoses:  Non-intractable vomiting with nausea, vomiting of unspecified type  End stage renal disease on dialysis  Anemia, unspecified anemia type      Ahmed Prima, MD 06/06/14 1058

## 2014-06-06 NOTE — ED Notes (Signed)
Pt from [facility] for "medical clearance" for dialysis today. Pt has dialysis Tu, Th, Sat every week and missed dialysis on Sat and Tues d/t N/V. Pt was to go to dialysis this morning at 1100 but [facility] has refused to take pt until she is "medically cleared". Pt denies any pain. Denies diarrhea. Last vomited Tues but c/o nausea.

## 2014-06-07 ENCOUNTER — Ambulatory Visit: Payer: Medicare Other

## 2014-06-10 ENCOUNTER — Ambulatory Visit
Admission: RE | Admit: 2014-06-10 | Discharge: 2014-06-10 | Disposition: A | Discharge status: Home / Self Care | Payer: Medicare Other | Source: Ambulatory Visit | Attending: Radiation Oncology | Admitting: Radiation Oncology

## 2014-06-10 ENCOUNTER — Ambulatory Visit: Payer: Medicare Other

## 2014-06-10 DIAGNOSIS — Z510 Encounter for antineoplastic radiation therapy: Secondary | ICD-10-CM | POA: Diagnosis not present

## 2014-06-11 ENCOUNTER — Ambulatory Visit: Payer: Medicare Other

## 2014-06-11 ENCOUNTER — Ambulatory Visit
Admission: RE | Admit: 2014-06-11 | Discharge: 2014-06-11 | Disposition: A | Discharge status: Home / Self Care | Payer: Medicare Other | Source: Ambulatory Visit | Attending: Radiation Oncology | Admitting: Radiation Oncology

## 2014-06-11 DIAGNOSIS — Z510 Encounter for antineoplastic radiation therapy: Secondary | ICD-10-CM | POA: Diagnosis not present

## 2014-06-12 ENCOUNTER — Ambulatory Visit
Admission: RE | Admit: 2014-06-12 | Discharge: 2014-06-12 | Disposition: A | Discharge status: Home / Self Care | Payer: Medicare Other | Source: Ambulatory Visit | Attending: Radiation Oncology | Admitting: Radiation Oncology

## 2014-06-12 DIAGNOSIS — Z510 Encounter for antineoplastic radiation therapy: Secondary | ICD-10-CM | POA: Diagnosis not present

## 2014-06-13 ENCOUNTER — Ambulatory Visit
Admission: RE | Admit: 2014-06-13 | Discharge: 2014-06-13 | Disposition: A | Discharge status: Home / Self Care | Payer: Medicare Other | Source: Ambulatory Visit | Attending: Radiation Oncology | Admitting: Radiation Oncology

## 2014-06-13 DIAGNOSIS — Z510 Encounter for antineoplastic radiation therapy: Secondary | ICD-10-CM | POA: Diagnosis not present

## 2014-06-14 ENCOUNTER — Ambulatory Visit: Admission: RE | Admit: 2014-06-14 | Payer: Medicare Other | Source: Ambulatory Visit

## 2014-06-14 ENCOUNTER — Ambulatory Visit
Admission: RE | Admit: 2014-06-14 | Discharge: 2014-06-14 | Disposition: A | Discharge status: Home / Self Care | Payer: Medicare Other | Source: Ambulatory Visit | Attending: Radiation Oncology | Admitting: Radiation Oncology

## 2014-06-14 DIAGNOSIS — Z510 Encounter for antineoplastic radiation therapy: Secondary | ICD-10-CM | POA: Diagnosis not present

## 2014-06-17 ENCOUNTER — Ambulatory Visit
Admission: RE | Admit: 2014-06-17 | Discharge: 2014-06-17 | Disposition: A | Discharge status: Home / Self Care | Payer: Medicare Other | Source: Ambulatory Visit | Attending: Radiation Oncology | Admitting: Radiation Oncology

## 2014-06-17 DIAGNOSIS — Z510 Encounter for antineoplastic radiation therapy: Secondary | ICD-10-CM | POA: Diagnosis not present

## 2014-06-18 ENCOUNTER — Ambulatory Visit
Admission: RE | Admit: 2014-06-18 | Discharge: 2014-06-18 | Disposition: A | Discharge status: Home / Self Care | Payer: Medicare Other | Source: Ambulatory Visit | Attending: Radiation Oncology | Admitting: Radiation Oncology

## 2014-06-18 DIAGNOSIS — Z510 Encounter for antineoplastic radiation therapy: Secondary | ICD-10-CM | POA: Diagnosis not present

## 2014-06-19 ENCOUNTER — Ambulatory Visit: Payer: Medicare Other

## 2014-06-19 DIAGNOSIS — Z510 Encounter for antineoplastic radiation therapy: Secondary | ICD-10-CM | POA: Diagnosis not present

## 2014-06-20 ENCOUNTER — Ambulatory Visit: Payer: Medicare Other

## 2014-06-20 DIAGNOSIS — Z510 Encounter for antineoplastic radiation therapy: Secondary | ICD-10-CM | POA: Diagnosis not present

## 2014-06-21 ENCOUNTER — Ambulatory Visit
Admission: RE | Admit: 2014-06-21 | Discharge: 2014-06-21 | Disposition: A | Discharge status: Home / Self Care | Payer: Medicare Other | Source: Ambulatory Visit | Attending: Radiation Oncology | Admitting: Radiation Oncology

## 2014-06-21 DIAGNOSIS — Z510 Encounter for antineoplastic radiation therapy: Secondary | ICD-10-CM | POA: Diagnosis not present

## 2014-06-25 ENCOUNTER — Ambulatory Visit
Admission: RE | Admit: 2014-06-25 | Discharge: 2014-06-25 | Disposition: A | Discharge status: Home / Self Care | Payer: Medicare Other | Source: Ambulatory Visit | Attending: Radiation Oncology | Admitting: Radiation Oncology

## 2014-06-25 DIAGNOSIS — Z510 Encounter for antineoplastic radiation therapy: Secondary | ICD-10-CM | POA: Diagnosis not present

## 2014-06-26 ENCOUNTER — Ambulatory Visit
Admission: RE | Admit: 2014-06-26 | Discharge: 2014-06-26 | Disposition: A | Discharge status: Home / Self Care | Payer: Medicare Other | Source: Ambulatory Visit | Attending: Radiation Oncology | Admitting: Radiation Oncology

## 2014-06-26 DIAGNOSIS — Z510 Encounter for antineoplastic radiation therapy: Secondary | ICD-10-CM | POA: Diagnosis not present

## 2014-06-27 ENCOUNTER — Ambulatory Visit
Admission: RE | Admit: 2014-06-27 | Discharge: 2014-06-27 | Disposition: A | Discharge status: Home / Self Care | Payer: Medicare Other | Source: Ambulatory Visit | Attending: Radiation Oncology | Admitting: Radiation Oncology

## 2014-06-27 DIAGNOSIS — Z510 Encounter for antineoplastic radiation therapy: Secondary | ICD-10-CM | POA: Diagnosis not present

## 2014-07-05 ENCOUNTER — Other Ambulatory Visit: Payer: Medicare Other

## 2014-07-05 ENCOUNTER — Inpatient Hospital Stay (HOSPITAL_BASED_OUTPATIENT_CLINIC_OR_DEPARTMENT_OTHER): Payer: Medicare Other | Admitting: Internal Medicine

## 2014-07-05 ENCOUNTER — Inpatient Hospital Stay: Payer: Medicare Other | Attending: Internal Medicine

## 2014-07-05 ENCOUNTER — Inpatient Hospital Stay: Payer: Medicare Other

## 2014-07-05 VITALS — BP 182/83 | HR 71 | Temp 96.0°F | Resp 18 | Ht 61.0 in

## 2014-07-05 DIAGNOSIS — M797 Fibromyalgia: Secondary | ICD-10-CM | POA: Insufficient documentation

## 2014-07-05 DIAGNOSIS — I10 Essential (primary) hypertension: Secondary | ICD-10-CM

## 2014-07-05 DIAGNOSIS — Z7901 Long term (current) use of anticoagulants: Secondary | ICD-10-CM | POA: Diagnosis not present

## 2014-07-05 DIAGNOSIS — E119 Type 2 diabetes mellitus without complications: Secondary | ICD-10-CM | POA: Diagnosis not present

## 2014-07-05 DIAGNOSIS — Z8673 Personal history of transient ischemic attack (TIA), and cerebral infarction without residual deficits: Secondary | ICD-10-CM | POA: Insufficient documentation

## 2014-07-05 DIAGNOSIS — Z79899 Other long term (current) drug therapy: Secondary | ICD-10-CM | POA: Diagnosis not present

## 2014-07-05 DIAGNOSIS — Z7982 Long term (current) use of aspirin: Secondary | ICD-10-CM

## 2014-07-05 DIAGNOSIS — C50911 Malignant neoplasm of unspecified site of right female breast: Secondary | ICD-10-CM

## 2014-07-05 DIAGNOSIS — R531 Weakness: Secondary | ICD-10-CM

## 2014-07-05 DIAGNOSIS — I82621 Acute embolism and thrombosis of deep veins of right upper extremity: Secondary | ICD-10-CM | POA: Diagnosis not present

## 2014-07-05 DIAGNOSIS — Z923 Personal history of irradiation: Secondary | ICD-10-CM | POA: Insufficient documentation

## 2014-07-05 DIAGNOSIS — N183 Chronic kidney disease, stage 3 (moderate): Secondary | ICD-10-CM | POA: Diagnosis not present

## 2014-07-05 DIAGNOSIS — Z853 Personal history of malignant neoplasm of breast: Secondary | ICD-10-CM | POA: Insufficient documentation

## 2014-07-05 DIAGNOSIS — E213 Hyperparathyroidism, unspecified: Secondary | ICD-10-CM | POA: Insufficient documentation

## 2014-07-05 DIAGNOSIS — D72819 Decreased white blood cell count, unspecified: Secondary | ICD-10-CM | POA: Diagnosis not present

## 2014-07-05 DIAGNOSIS — R05 Cough: Secondary | ICD-10-CM

## 2014-07-05 DIAGNOSIS — D638 Anemia in other chronic diseases classified elsewhere: Secondary | ICD-10-CM | POA: Insufficient documentation

## 2014-07-05 DIAGNOSIS — I129 Hypertensive chronic kidney disease with stage 1 through stage 4 chronic kidney disease, or unspecified chronic kidney disease: Secondary | ICD-10-CM

## 2014-07-05 DIAGNOSIS — Z87891 Personal history of nicotine dependence: Secondary | ICD-10-CM | POA: Diagnosis not present

## 2014-07-05 DIAGNOSIS — Z9221 Personal history of antineoplastic chemotherapy: Secondary | ICD-10-CM | POA: Diagnosis not present

## 2014-07-05 DIAGNOSIS — R0609 Other forms of dyspnea: Secondary | ICD-10-CM | POA: Insufficient documentation

## 2014-07-05 LAB — CBC WITH DIFFERENTIAL/PLATELET
Basophils Absolute: 0 10*3/uL (ref 0–0.1)
EOS ABS: 0.1 10*3/uL (ref 0–0.7)
Eosinophils Relative: 3 %
HCT: 25.8 % — ABNORMAL LOW (ref 35.0–47.0)
Hemoglobin: 8.5 g/dL — ABNORMAL LOW (ref 12.0–16.0)
Lymphocytes Relative: 25 %
Lymphs Abs: 0.5 10*3/uL — ABNORMAL LOW (ref 1.0–3.6)
MCH: 28.6 pg (ref 26.0–34.0)
MCHC: 33 g/dL (ref 32.0–36.0)
MCV: 86.8 fL (ref 80.0–100.0)
Monocytes Absolute: 0.4 10*3/uL (ref 0.2–0.9)
Monocytes Relative: 23 %
Neutro Abs: 0.9 10*3/uL — ABNORMAL LOW (ref 1.4–6.5)
Neutrophils Relative %: 48 %
PLATELETS: 146 10*3/uL — AB (ref 150–440)
RBC: 2.98 MIL/uL — ABNORMAL LOW (ref 3.80–5.20)
RDW: 17.5 % — ABNORMAL HIGH (ref 11.5–14.5)
WBC: 1.9 10*3/uL — AB (ref 3.6–11.0)

## 2014-07-05 LAB — HEPATIC FUNCTION PANEL
ALBUMIN: 3.2 g/dL — AB (ref 3.5–5.0)
ALK PHOS: 57 U/L (ref 38–126)
ALT: 26 U/L (ref 14–54)
AST: 48 U/L — ABNORMAL HIGH (ref 15–41)
Bilirubin, Direct: <0.1 mg/dL — ABNORMAL LOW (ref 0.1–0.5)
Total Bilirubin: 0.5 mg/dL (ref 0.3–1.2)
Total Protein: 6.8 g/dL (ref 6.5–8.1)

## 2014-07-05 MED ORDER — SODIUM CHLORIDE 0.9 % IJ SOLN
10.0000 mL | INTRAMUSCULAR | Status: DC | PRN
  Administered 2014-07-05: 10 mL via INTRAVENOUS
  Filled 2014-07-05: qty 10

## 2014-07-05 MED ORDER — HEPARIN SOD (PORK) LOCK FLUSH 100 UNIT/ML IV SOLN
INTRAVENOUS | Status: AC
  Filled 2014-07-05: qty 5

## 2014-07-05 MED ORDER — HEPARIN SOD (PORK) LOCK FLUSH 100 UNIT/ML IV SOLN
500.0000 [IU] | Freq: Once | INTRAVENOUS | Status: AC
  Administered 2014-07-05: 500 [IU] via INTRAVENOUS

## 2014-07-06 LAB — CANCER ANTIGEN 27.29: CA 27.29: 25.3 U/mL (ref 0.0–38.6)

## 2014-07-08 ENCOUNTER — Other Ambulatory Visit: Payer: Self-pay | Admitting: *Deleted

## 2014-07-08 DIAGNOSIS — C50911 Malignant neoplasm of unspecified site of right female breast: Secondary | ICD-10-CM

## 2014-07-16 ENCOUNTER — Ambulatory Visit
Admission: RE | Admit: 2014-07-16 | Discharge: 2014-07-16 | Disposition: A | Discharge status: Home / Self Care | Payer: Medicare Other | Source: Ambulatory Visit | Attending: Internal Medicine | Admitting: Internal Medicine

## 2014-07-16 ENCOUNTER — Other Ambulatory Visit: Payer: Self-pay | Admitting: Internal Medicine

## 2014-07-16 ENCOUNTER — Ambulatory Visit: Payer: Medicare Other

## 2014-07-16 DIAGNOSIS — Z853 Personal history of malignant neoplasm of breast: Secondary | ICD-10-CM | POA: Insufficient documentation

## 2014-07-16 DIAGNOSIS — Z9889 Other specified postprocedural states: Secondary | ICD-10-CM | POA: Insufficient documentation

## 2014-07-16 DIAGNOSIS — C50911 Malignant neoplasm of unspecified site of right female breast: Secondary | ICD-10-CM

## 2014-07-21 NOTE — Progress Notes (Signed)
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  Margins uninvolved by invasive carcinoma, distance from closest margin is 1 mm (medial). For DCIS, margins uninvolved by DCIS with distance from closest margin < 94m ( deep, media and anterior). Invasive Carcinoma: Tumor size 1.5 cm, grade 2, ER negative (0%), PR negative (0%), HER-2/neu negative by FISH (Her2/CEP17 ratio 1.01).  No axillary nodes studied (per d/w Dr. WRochel Brome he feels based on the location of the large surgical incision the yield of doing a sentinel lymph node study would be small. Insurance did not approve PET or CT scan for staging. Therefore ultrasound right axilla done on 06/15/13 reports: No mass lesion or soft tissue distortion is seen within the axillary fat. The largest axillary node measures 1.8 x 0.6 x 0.8 cm, and has a smooth, homogeneous cortical mantle measuring up to 2 mm thickness. The fatty hilum and hilar vascular structures are preserved. IMPRESSION: Negative for right axillary lymphadenopathy).  Patient got adjuvant chemotherapy (completed AC x 4 on 10/08/13, then completed 9 out of 12 weeks Taxol on 03/01/14 after which chemo was discontinued due to poor tolerability and hospitalization with possible recurrent stroke early 2016).     HISTORY OF PRESENT ILLNESS:  Patient returns for continued oncology evaluation, she was seen few months ago. She took adjuvant chemo, completed 9 weeks of Taxol treatment on February 5 following which it was stopped due to hospitalization with what she feels  was another stroke. Currently states that she is doing about the same, still extremely weak. She continues on dialysis 3 times a week. States chronic mild cough and chronic dyspnea on exertion is at baseline, ambulates slowly. No fevers or chills. States that right neck and upper extremity swelling is improving well, on anticoagulation for DVT. Denies feeling new breast masses on self-exam.  REVIEW OF SYSTEMS:   ROS As in HPI above. In addition, no fever, chills or sweats. No new headaches.  No new sore throat or dysphagia. No hemoptysis or chest pain. No  abdominal pain, constipation, diarrhea, dysuria or hematuria. No new skin rash or bleeding symptoms. No polyuria polydipsia.  PS ECOG 3.  PAST MEDICAL HISTORY: Reviewed. Past Medical History  Diagnosis Date  . History of stroke   . Hypertension   . Chronic kidney disease     stage III  . Diastolic congestive heart failure     ef 55-60%  . Fibromyalgia   . DVT (deep venous thrombosis)   . Hyperparathyroidism   . Stroke   . Diabetes mellitus without complication   . Breast cancer     Triple Negative          CVA x3 in 1997, 2004 and 2013. Per patient report, it was likely from smoking crack  Hypertension  History of UTI  Right breast lumpectomy 05/25/2013 for T1c Nx triple negative breast cancer   PAST SURGICAL HISTORY: Reviewed. Past Surgical History  Procedure Laterality Date  . Appendectomy    . Cesarean section      x3  . Breast lumpectomy Right may 2015  . Breast biopsy Right  03/27/2013 positive  . Status post radiation Left     breast cancer  . Status post chemo Left     breast cancer    FAMILY HISTORY: Reviewed. Family History  Problem Relation Age of Onset  . Hypertension Father   . Cancer Mother     Pancreatic    ADVANCED DIRECTIVES:  No - patient declined information  SOCIAL HISTORY: Reviewed. History  Substance Use Topics  . Smoking status: Former Smoker    Types: Cigarettes    Quit date:  01/25/2013  . Smokeless tobacco: Not on file  . Alcohol Use: No    Allergies  Allergen Reactions  . Acetaminophen     Other reaction(s): Unknown  . Hydralazine   . Isosorbide   . Levaquin [Levofloxacin In D5w]   . Lisinopril     Other reaction(s): Other (qualifier value) pt states caused her stroke  . Oxycodone   . Oxycodone-Acetaminophen     Other reaction(s): Unknown  . Penicillins     Current Outpatient Prescriptions  Medication Sig Dispense Refill  . amLODipine (NORVASC) 10 MG tablet Take 10 mg by mouth daily.    Mar[place] Kitchen apixaban (ELIQUIS) 2.5 MG TABS tablet Take 2.5 mg by mouth 2 (two) times daily.    Mar[place] Kitchen aspirin 81 MG tablet Take 81 mg by mouth daily.    Marland Kitchen atorvastatin (LIPITOR) 20 MG tablet Take 20 mg by mouth daily.    Marland Kitchen b complex-vitamin c-folic acid (NEPHRO-VITE) 0.8 MG TABS tablet Take 1 tablet by mouth at bedtime.    . bisacodyl (DULCOLAX) 5 MG EC tablet Take 5 mg by mouth daily as needed for moderate constipation.    . carvedilol (COREG) 6.25 MG tablet Take 12.5 mg by mouth daily.    . cloNIDine (CATAPRES) 0.2 MG tablet Take 0.2 mg by mouth 2 (two) times daily.    . clopidogrel (PLAVIX) 75 MG tablet Take 75 mg by mouth daily.    . fluticasone (FLONASE) 50 MCG/ACT nasal spray Place 2 sprays into both nostrils daily.    Mar[place] Kitchen gabapentin (NEURONTIN) 300 MG capsule Take 300 mg by mouth daily.    Mar[place] Kitchen glipiZIDE (GLUCOTROL XL) 2.5 MG 24 hr tablet Take 2.5 mg by mouth daily.    Marland Kitchen lidocaine (XYLOCAINE) 2 % solution Use as directed 10 mLs in the mouth or throat every 6 (six) hours as needed for mouth pain.    . Lidocaine-Prilocaine, Bulk, 1.7-4.9 % CREA 1 application by Does not apply route daily. Apply once a day every Tuesday, Thursday, Saturday for pain    . meclizine (ANTIVERT) 25 MG tablet Take 25 mg by mouth every 12 (twelve) hours.  0  . Melatonin 3 MG TABS Take 2 tablets by mouth daily.    . promethazine (PHENERGAN) 25 MG tablet Take 25 mg by mouth every 6 (six) hours as needed  for nausea or vomiting.    . senna-docusate (SENOKOT-S) 8.6-50 MG per tablet Take 2 tablets by mouth every 12 (twelve) hours.    . simvastatin (ZOCOR) 20 MG tablet Take 20 mg by mouth daily.    Marland Kitchen sulfamethoxazole-trimethoprim (BACTRIM DS,SEPTRA DS) 800-160 MG per tablet Take 1 tablet by mouth 2 (two) times daily. 20 tablet no  . traMADol (ULTRAM) 50 MG tablet Take 50 mg by mouth every 12 (twelve) hours as needed.    . trolamine salicylate (ASPERCREME) 10 % cream Apply 1 application topically as needed for muscle pain (Apply to left shoulder topically every day and evening shift for  pain).    . Vitamin D, Ergocalciferol, (DRISDOL) 50000 UNITS CAPS capsule Take by mouth.    . zolpidem (AMBIEN) 5 MG tablet Take 5 mg by mouth at bedtime as needed for sleep.     No current facility-administered medications for this visit.    PHYSICAL EXAM: Filed Vitals:   07/05/14 1220  BP: 182/83  Pulse: 71  Temp: 96 F (35.6 C)  Resp: 18     There is no weight on file to calculate BMI.    ECOG FS:3 - Symptomatic, >50% confined to bed  GENERAL: Patient is alert and oriented and in no acute distress. There is no icterus. HEENT: EOMs intact. No cervical lymphadenopathy. CVS: S1S2, regular LUNGS: Bilaterally clear to auscultation, no rhonchi. ABDOMEN: Soft, nontender. No hepatomegaly clinically.  EXTREMITIES: No pedal edema. BREASTS: no obvious masses on either side. No axillary adenopathy on either side. Exam performed in presence of a nurse.   LAB RESULTS:  07/05/14 - CA 27.29 normal at 25.3.    Component Value Date/Time   NA 134* 06/06/2014 0754   NA 141 02/22/2014 0906   K 5.5* 06/06/2014 0754   K 3.9 02/22/2014 0906   CL 104 06/06/2014 0754   CL 104 02/22/2014 0906   CO2 23 06/06/2014 0754   CO2 30 02/22/2014 0906   GLUCOSE 64* 06/06/2014 0754   GLUCOSE 155* 02/22/2014 0906   BUN 32* 06/06/2014 0754   BUN 21* 02/22/2014 0906   CREATININE 5.08* 06/06/2014 0754   CREATININE 4.10*  02/22/2014 0906   CALCIUM 8.8* 06/06/2014 0754   CALCIUM 8.5 02/22/2014 0906   PROT 6.8 07/05/2014 1213   PROT 6.5 02/22/2014 0906   ALBUMIN 3.2* 07/05/2014 1213   ALBUMIN 3.0* 02/22/2014 0906   AST 48* 07/05/2014 1213   AST 24 02/22/2014 0906   ALT 26 07/05/2014 1213   ALT 33 02/22/2014 0906   ALKPHOS 57 07/05/2014 1213   ALKPHOS 100 02/22/2014 0906   BILITOT 0.5 07/05/2014 1213   GFRNONAA 8* 06/06/2014 0754   GFRNONAA 12* 10/15/2013 1410   GFRAA 10* 06/06/2014 0754   GFRAA 14* 10/15/2013 1410   Lab Results  Component Value Date   WBC 1.9* 07/05/2014   NEUTROABS 0.9* 07/05/2014   HGB 8.5* 07/05/2014   HCT 25.8* 07/05/2014   MCV 86.8 07/05/2014   PLT 146* 07/05/2014     STUDIES: 11/26/13 - Right upper extremity/neck venous doppler. IMPRESSION:  1. Positive for occlusive thrombus in the right internal jugular vein.  2. Decreased respiratory phasicity in the axillary and subclavian veins raises the possibility of thrombus extension into the right brachiocephalic vein and superior vena cava.   ASSESSMENT / PLAN:   1. 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 Margins uninvolved by invasive carcinoma, distance from closest margin is 1 mm (medial). For DCIS, margins uninvolved by DCIS with distance from closest margin < 85m ( deep, media and anterior).  Invasive Carcinoma: Tumor size 1.5 cm, grade 2, ER negative (0%), PR negative (0%), HER-2/neu negative by FISH (Her2/CEP17 ratio 1.01). No axillary nodes studied (see discussion above. Right axilla ultrasound reported negative for lymphadenopathy). Patient got adjuvant chemotherapy (completed AC x 4 on 10/08/13, then completed 9 out of 12 weeks Taxol on 03/01/14 after which chemo was discontinued due to poor tolerability and hospitalization with possible recurrent  stroke early 2016).   -  Have reviewed labs from today and d/w patient. She continues to have chronic weakness and overall poor Performance status (ECOG 3) and she has multiple other significant comorbidities. She has completed AC x4 and Taxol 9/12 doses so far. No clinical evidence to suggest recurrent/metastatic breast cancer. Serum CA 27.29 level is in normal range. Plan is to surveillance, will get surveillance mammogram, and see her back at about 4-5 months with repeat labs including CA 27.29 level.          2. 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          3. Leucopenia/neutropenia - unclear etiology, will monitor. If it worsens she will need further workup.         4. BRCA 1 and BRCA 2 mutations negative.          5. 11/26/13 - Right upper extremity/neck venous doppler reported occlusive thrombus in the right internal jugular vein. Symptoms improved           well, on anticoagulation.     6. continue Port-A-Cath flush once every 6 weeks. In between visits, patient has been advised to call or come to ER in case of fevers, bleeding, acute sickness or new symptoms. She is agreeable to above plan.    Leia Alf, MD   07/21/2014 5:34 PM

## 2014-07-23 ENCOUNTER — Other Ambulatory Visit: Payer: Self-pay

## 2014-07-23 DIAGNOSIS — C50919 Malignant neoplasm of unspecified site of unspecified female breast: Secondary | ICD-10-CM

## 2014-07-24 ENCOUNTER — Inpatient Hospital Stay: Payer: Medicare Other

## 2014-07-24 DIAGNOSIS — C50919 Malignant neoplasm of unspecified site of unspecified female breast: Secondary | ICD-10-CM

## 2014-07-24 DIAGNOSIS — Z853 Personal history of malignant neoplasm of breast: Secondary | ICD-10-CM | POA: Diagnosis not present

## 2014-07-24 LAB — CBC WITH DIFFERENTIAL/PLATELET
Basophils Absolute: 0 10*3/uL (ref 0–0.1)
Basophils Relative: 1 %
Eosinophils Absolute: 0.2 10*3/uL (ref 0–0.7)
Eosinophils Relative: 5 %
HCT: 25.7 % — ABNORMAL LOW (ref 35.0–47.0)
Hemoglobin: 8.4 g/dL — ABNORMAL LOW (ref 12.0–16.0)
Lymphocytes Relative: 21 %
Lymphs Abs: 0.6 10*3/uL — ABNORMAL LOW (ref 1.0–3.6)
MCH: 29 pg (ref 26.0–34.0)
MCHC: 32.8 g/dL (ref 32.0–36.0)
MCV: 88.5 fL (ref 80.0–100.0)
MONOS PCT: 19 %
Monocytes Absolute: 0.6 10*3/uL (ref 0.2–0.9)
Neutro Abs: 1.7 10*3/uL (ref 1.4–6.5)
Neutrophils Relative %: 54 %
PLATELETS: 155 10*3/uL (ref 150–440)
RBC: 2.91 MIL/uL — AB (ref 3.80–5.20)
RDW: 17.2 % — ABNORMAL HIGH (ref 11.5–14.5)
WBC: 3.1 10*3/uL — ABNORMAL LOW (ref 3.6–11.0)

## 2014-07-25 ENCOUNTER — Other Ambulatory Visit: Payer: Medicare Other

## 2014-07-31 ENCOUNTER — Encounter: Payer: Self-pay | Admitting: Radiation Oncology

## 2014-07-31 ENCOUNTER — Ambulatory Visit
Admission: RE | Admit: 2014-07-31 | Discharge: 2014-07-31 | Disposition: A | Discharge status: Home / Self Care | Payer: Medicare Other | Source: Ambulatory Visit | Attending: Radiation Oncology | Admitting: Radiation Oncology

## 2014-07-31 ENCOUNTER — Inpatient Hospital Stay: Payer: Medicare Other | Attending: Radiation Oncology

## 2014-07-31 VITALS — BP 148/82 | HR 80 | Temp 96.8°F | Resp 18

## 2014-07-31 DIAGNOSIS — Z853 Personal history of malignant neoplasm of breast: Secondary | ICD-10-CM | POA: Insufficient documentation

## 2014-07-31 DIAGNOSIS — C50919 Malignant neoplasm of unspecified site of unspecified female breast: Secondary | ICD-10-CM

## 2014-07-31 DIAGNOSIS — Z452 Encounter for adjustment and management of vascular access device: Secondary | ICD-10-CM | POA: Insufficient documentation

## 2014-07-31 NOTE — Progress Notes (Signed)
Radiation Oncology Follow up Note  Name: Jessica Duran   Date:   07/31/2014 MRN:  [id number] DOB: Dec 29, 1955    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 She's 1 month out having completed radiation therapy to her right breast for a T1c NX triple negative invasive mammary carcinoma. She received adjuvant Cytoxan and Adriamycin with also 10 of 12 cycles of Taxol.. I treated her right breast and peripheral lymphatics. She seen today in routine follow-up and is doing well. She specifically denies breast tenderness cough or bone pain. Patient does have chronic weakness and overall poor performance status and continues on dialysis. She also has anemia from chemotherapy and chronic renal disease. She's currently on anticoagulation for right upper extremity neck occlusive thrombus in the right internal jugular vein. COMPLICATIONS OF TREATMENT: none  FOLLOW UP COMPLIANCE: keeps appointments   PHYSICAL EXAM:  BP 148/82 mmHg  Pulse 80  Temp(Src) 96.8 F (36 C)  Resp 18   A well-developed female appears much older than stated age wheelchair-bound. Right breast has some hyperpigmentation no dominant mass or nodularity is noted in either breast in 2 positions examined. Lungs are clear to A&P. No axillary or supra clavicular adenopathy is noted. Well-developed well-nourished patient in NAD. HEENT reveals PERLA, EOMI, discs not visualized.  Oral cavity is clear. No oral mucosal lesions are identified. Neck is clear without evidence of cervical or supraclavicular adenopathy. Lungs are clear to A&P. Cardiac examination is essentially unremarkable with regular rate and rhythm without  murmur rub or thrill. Abdomen is benign with no organomegaly or masses noted. Motor sensory and DTR levels are equal and symmetric in the upper and lower extremities. Cranial nerves II through XII are grossly intact. Proprioception is intact. No peripheral adenopathy or edema is identified. No motor or sensory levels are noted. Crude visual fields are within normal range.   RADIOLOGY RESULTS: No recent radiology results are for review.  PLAN: At the present time she is recovering well from her radiation therapy treatments still remains with significant comorbidities as listed above. Otherwise I'm please were overall progress. Follow-up mammograms will be ordered at about 6 months. She is not a candidate for aromatase inhibitor therapy based on the triple negative nature of her disease. I have asked to see her back in 6 months for follow-up. She knows to call sooner with any concerns.  I would like to take this opportunity for allowing me to participate in the care of your patient.Jessica Duran., MD

## 2014-08-16 ENCOUNTER — Inpatient Hospital Stay: Payer: Medicare Other

## 2014-08-16 DIAGNOSIS — Z853 Personal history of malignant neoplasm of breast: Secondary | ICD-10-CM | POA: Diagnosis present

## 2014-08-16 DIAGNOSIS — Z452 Encounter for adjustment and management of vascular access device: Secondary | ICD-10-CM | POA: Diagnosis not present

## 2014-08-16 DIAGNOSIS — C50911 Malignant neoplasm of unspecified site of right female breast: Secondary | ICD-10-CM

## 2014-08-16 MED ORDER — HEPARIN SOD (PORK) LOCK FLUSH 100 UNIT/ML IV SOLN
INTRAVENOUS | Status: AC
  Filled 2014-08-16: qty 5

## 2014-08-16 MED ORDER — HEPARIN SOD (PORK) LOCK FLUSH 100 UNIT/ML IV SOLN
500.0000 [IU] | Freq: Once | INTRAVENOUS | Status: AC
  Administered 2014-08-16: 500 [IU]

## 2014-08-16 MED ORDER — SODIUM CHLORIDE 0.9 % IJ SOLN
10.0000 mL | INTRAMUSCULAR | Status: AC | PRN
Start: 2014-08-16 — End: ?
  Administered 2014-08-16: 10 mL
  Filled 2014-08-16: qty 10

## 2014-08-16 MED ORDER — HEPARIN SOD (PORK) LOCK FLUSH 10 UNIT/ML IV SOLN
10.0000 [IU] | Freq: Once | INTRAVENOUS | Status: AC
  Filled 2014-08-16: qty 1

## 2014-08-21 ENCOUNTER — Ambulatory Visit: Payer: Medicaid Other | Admitting: Neurology

## 2014-08-22 ENCOUNTER — Encounter: Payer: Self-pay | Admitting: Neurology

## 2014-09-20 ENCOUNTER — Other Ambulatory Visit: Payer: Self-pay | Admitting: Vascular Surgery

## 2014-09-23 ENCOUNTER — Ambulatory Visit: Admission: RE | Admit: 2014-09-23 | Payer: Medicare Other | Source: Ambulatory Visit | Admitting: Vascular Surgery

## 2014-09-23 ENCOUNTER — Encounter: Admission: RE | Payer: Self-pay | Source: Ambulatory Visit

## 2014-09-23 SURGERY — A/V SHUNTOGRAM/FISTULAGRAM
Anesthesia: Moderate Sedation | Laterality: Right

## 2014-09-27 ENCOUNTER — Inpatient Hospital Stay: Payer: Medicare Other

## 2014-10-04 ENCOUNTER — Inpatient Hospital Stay: Payer: Medicare Other

## 2014-10-07 ENCOUNTER — Other Ambulatory Visit: Payer: Self-pay | Admitting: Family Medicine

## 2014-10-07 ENCOUNTER — Other Ambulatory Visit: Payer: Self-pay | Admitting: Vascular Surgery

## 2014-10-07 DIAGNOSIS — M7918 Myalgia, other site: Secondary | ICD-10-CM

## 2014-10-08 ENCOUNTER — Inpatient Hospital Stay
Admission: EM | Admit: 2014-10-08 | Discharge: 2014-10-26 | DRG: 871 | Disposition: E | Payer: Medicare Other | Attending: Internal Medicine | Admitting: Internal Medicine

## 2014-10-08 ENCOUNTER — Encounter: Payer: Self-pay | Admitting: Emergency Medicine

## 2014-10-08 ENCOUNTER — Emergency Department: Payer: Medicare Other

## 2014-10-08 ENCOUNTER — Other Ambulatory Visit: Payer: Self-pay

## 2014-10-08 ENCOUNTER — Inpatient Hospital Stay: Payer: Medicare Other

## 2014-10-08 DIAGNOSIS — L8990 Pressure ulcer of unspecified site, unspecified stage: Secondary | ICD-10-CM | POA: Insufficient documentation

## 2014-10-08 DIAGNOSIS — A414 Sepsis due to anaerobes: Secondary | ICD-10-CM | POA: Diagnosis present

## 2014-10-08 DIAGNOSIS — Z9115 Patient's noncompliance with renal dialysis: Secondary | ICD-10-CM | POA: Diagnosis present

## 2014-10-08 DIAGNOSIS — R6521 Severe sepsis with septic shock: Secondary | ICD-10-CM | POA: Diagnosis present

## 2014-10-08 DIAGNOSIS — I69398 Other sequelae of cerebral infarction: Secondary | ICD-10-CM | POA: Diagnosis not present

## 2014-10-08 DIAGNOSIS — Z853 Personal history of malignant neoplasm of breast: Secondary | ICD-10-CM | POA: Diagnosis not present

## 2014-10-08 DIAGNOSIS — Z66 Do not resuscitate: Secondary | ICD-10-CM | POA: Diagnosis not present

## 2014-10-08 DIAGNOSIS — Z808 Family history of malignant neoplasm of other organs or systems: Secondary | ICD-10-CM

## 2014-10-08 DIAGNOSIS — H532 Diplopia: Secondary | ICD-10-CM | POA: Diagnosis present

## 2014-10-08 DIAGNOSIS — Z9889 Other specified postprocedural states: Secondary | ICD-10-CM

## 2014-10-08 DIAGNOSIS — N186 End stage renal disease: Secondary | ICD-10-CM | POA: Diagnosis present

## 2014-10-08 DIAGNOSIS — F419 Anxiety disorder, unspecified: Secondary | ICD-10-CM | POA: Diagnosis present

## 2014-10-08 DIAGNOSIS — C50911 Malignant neoplasm of unspecified site of right female breast: Secondary | ICD-10-CM | POA: Diagnosis not present

## 2014-10-08 DIAGNOSIS — Z87891 Personal history of nicotine dependence: Secondary | ICD-10-CM

## 2014-10-08 DIAGNOSIS — Z9221 Personal history of antineoplastic chemotherapy: Secondary | ICD-10-CM

## 2014-10-08 DIAGNOSIS — G4700 Insomnia, unspecified: Secondary | ICD-10-CM | POA: Diagnosis present

## 2014-10-08 DIAGNOSIS — A419 Sepsis, unspecified organism: Secondary | ICD-10-CM | POA: Diagnosis not present

## 2014-10-08 DIAGNOSIS — E876 Hypokalemia: Secondary | ICD-10-CM | POA: Diagnosis present

## 2014-10-08 DIAGNOSIS — Z8249 Family history of ischemic heart disease and other diseases of the circulatory system: Secondary | ICD-10-CM

## 2014-10-08 DIAGNOSIS — R680 Hypothermia, not associated with low environmental temperature: Secondary | ICD-10-CM | POA: Diagnosis present

## 2014-10-08 DIAGNOSIS — N2581 Secondary hyperparathyroidism of renal origin: Secondary | ICD-10-CM | POA: Diagnosis present

## 2014-10-08 DIAGNOSIS — E1122 Type 2 diabetes mellitus with diabetic chronic kidney disease: Secondary | ICD-10-CM | POA: Diagnosis present

## 2014-10-08 DIAGNOSIS — Z515 Encounter for palliative care: Secondary | ICD-10-CM

## 2014-10-08 DIAGNOSIS — Z171 Estrogen receptor negative status [ER-]: Secondary | ICD-10-CM | POA: Diagnosis not present

## 2014-10-08 DIAGNOSIS — E11319 Type 2 diabetes mellitus with unspecified diabetic retinopathy without macular edema: Secondary | ICD-10-CM | POA: Diagnosis present

## 2014-10-08 DIAGNOSIS — Z992 Dependence on renal dialysis: Secondary | ICD-10-CM | POA: Diagnosis not present

## 2014-10-08 DIAGNOSIS — D689 Coagulation defect, unspecified: Secondary | ICD-10-CM | POA: Diagnosis present

## 2014-10-08 DIAGNOSIS — D631 Anemia in chronic kidney disease: Secondary | ICD-10-CM | POA: Diagnosis present

## 2014-10-08 DIAGNOSIS — F329 Major depressive disorder, single episode, unspecified: Secondary | ICD-10-CM | POA: Diagnosis present

## 2014-10-08 DIAGNOSIS — Z86718 Personal history of other venous thrombosis and embolism: Secondary | ICD-10-CM | POA: Diagnosis not present

## 2014-10-08 DIAGNOSIS — I69354 Hemiplegia and hemiparesis following cerebral infarction affecting left non-dominant side: Secondary | ICD-10-CM | POA: Diagnosis not present

## 2014-10-08 DIAGNOSIS — L89153 Pressure ulcer of sacral region, stage 3: Secondary | ICD-10-CM | POA: Diagnosis present

## 2014-10-08 DIAGNOSIS — E871 Hypo-osmolality and hyponatremia: Secondary | ICD-10-CM | POA: Diagnosis present

## 2014-10-08 DIAGNOSIS — A047 Enterocolitis due to Clostridium difficile: Secondary | ICD-10-CM | POA: Diagnosis present

## 2014-10-08 DIAGNOSIS — R627 Adult failure to thrive: Secondary | ICD-10-CM | POA: Diagnosis present

## 2014-10-08 DIAGNOSIS — I5032 Chronic diastolic (congestive) heart failure: Secondary | ICD-10-CM | POA: Diagnosis present

## 2014-10-08 DIAGNOSIS — Z923 Personal history of irradiation: Secondary | ICD-10-CM

## 2014-10-08 DIAGNOSIS — I120 Hypertensive chronic kidney disease with stage 5 chronic kidney disease or end stage renal disease: Secondary | ICD-10-CM | POA: Diagnosis present

## 2014-10-08 DIAGNOSIS — R0602 Shortness of breath: Secondary | ICD-10-CM | POA: Diagnosis present

## 2014-10-08 DIAGNOSIS — I959 Hypotension, unspecified: Secondary | ICD-10-CM | POA: Diagnosis present

## 2014-10-08 DIAGNOSIS — J90 Pleural effusion, not elsewhere classified: Secondary | ICD-10-CM

## 2014-10-08 DIAGNOSIS — R109 Unspecified abdominal pain: Secondary | ICD-10-CM

## 2014-10-08 DIAGNOSIS — Z7902 Long term (current) use of antithrombotics/antiplatelets: Secondary | ICD-10-CM

## 2014-10-08 DIAGNOSIS — M797 Fibromyalgia: Secondary | ICD-10-CM | POA: Diagnosis present

## 2014-10-08 DIAGNOSIS — A0472 Enterocolitis due to Clostridium difficile, not specified as recurrent: Secondary | ICD-10-CM | POA: Insufficient documentation

## 2014-10-08 HISTORY — DX: Disorder of kidney and ureter, unspecified: N28.9

## 2014-10-08 LAB — BASIC METABOLIC PANEL
ANION GAP: 15 (ref 5–15)
BUN: 84 mg/dL — AB (ref 6–20)
CALCIUM: 8.4 mg/dL — AB (ref 8.9–10.3)
CO2: 18 mmol/L — AB (ref 22–32)
Chloride: 104 mmol/L (ref 101–111)
Creatinine, Ser: 7.3 mg/dL — ABNORMAL HIGH (ref 0.44–1.00)
GFR calc Af Amer: 6 mL/min — ABNORMAL LOW (ref >60)
GFR calc non Af Amer: 5 mL/min — ABNORMAL LOW (ref >60)
GLUCOSE: 102 mg/dL — AB (ref 65–99)
POTASSIUM: 3 mmol/L — AB (ref 3.5–5.1)
Sodium: 137 mmol/L (ref 135–145)

## 2014-10-08 LAB — CBC WITH DIFFERENTIAL/PLATELET
BASOS ABS: 0 10*3/uL (ref 0–0.1)
Basophils Relative: 0 %
Eosinophils Absolute: 0 10*3/uL (ref 0–0.7)
Eosinophils Relative: 0 %
HEMATOCRIT: 28.5 % — AB (ref 35.0–47.0)
Hemoglobin: 9.3 g/dL — ABNORMAL LOW (ref 12.0–16.0)
LYMPHS ABS: 0.7 10*3/uL — AB (ref 1.0–3.6)
LYMPHS PCT: 3 %
MCH: 28.9 pg (ref 26.0–34.0)
MCHC: 32.6 g/dL (ref 32.0–36.0)
MCV: 88.7 fL (ref 80.0–100.0)
MONO ABS: 0.9 10*3/uL (ref 0.2–0.9)
Monocytes Relative: 4 %
NEUTROS ABS: 23.4 10*3/uL — AB (ref 1.4–6.5)
Neutrophils Relative %: 93 %
Platelets: 184 10*3/uL (ref 150–440)
RBC: 3.21 MIL/uL — ABNORMAL LOW (ref 3.80–5.20)
RDW: 16 % — AB (ref 11.5–14.5)
WBC: 25 10*3/uL — ABNORMAL HIGH (ref 3.6–11.0)

## 2014-10-08 LAB — TROPONIN I: Troponin I: 0.36 ng/mL — ABNORMAL HIGH (ref <0.031)

## 2014-10-08 LAB — LACTIC ACID, PLASMA
Lactic Acid, Venous: 0.8 mmol/L (ref 0.5–2.0)
Lactic Acid, Venous: 0.9 mmol/L (ref 0.5–2.0)

## 2014-10-08 MED ORDER — ONDANSETRON HCL 4 MG/2ML IJ SOLN: 4.0000 mg | Freq: Four times a day (QID) | INTRAMUSCULAR | Status: DC | PRN

## 2014-10-08 MED ORDER — IOHEXOL 300 MG/ML  SOLN
100.0000 mL | Freq: Once | INTRAMUSCULAR | Status: AC | PRN
  Administered 2014-10-08: 100 mL via INTRAVENOUS

## 2014-10-08 MED ORDER — MELATONIN 3 MG PO TABS: 3.0000 mg | ORAL_TABLET | Freq: Every evening | ORAL | Status: DC | PRN

## 2014-10-08 MED ORDER — VANCOMYCIN HCL IN DEXTROSE 1-5 GM/200ML-% IV SOLN
1000.0000 mg | Freq: Once | INTRAVENOUS | Status: AC
  Administered 2014-10-08: 1000 mg via INTRAVENOUS
  Filled 2014-10-08: qty 200

## 2014-10-08 MED ORDER — CLOPIDOGREL BISULFATE 75 MG PO TABS
75.0000 mg | ORAL_TABLET | Freq: Every day | ORAL | Status: DC
  Administered 2014-10-08 – 2014-10-11 (×4): 75 mg via ORAL
  Filled 2014-10-08 (×4): qty 1

## 2014-10-08 MED ORDER — NITROGLYCERIN 0.4 MG SL SUBL
0.4000 mg | SUBLINGUAL_TABLET | SUBLINGUAL | Status: DC | PRN
Start: 2014-10-08 — End: 2014-10-11

## 2014-10-08 MED ORDER — BISACODYL 5 MG PO TBEC: 10.0000 mg | DELAYED_RELEASE_TABLET | Freq: Every day | ORAL | Status: DC | PRN

## 2014-10-08 MED ORDER — PROMETHAZINE HCL 25 MG PO TABS
25.0000 mg | ORAL_TABLET | Freq: Four times a day (QID) | ORAL | Status: DC | PRN
  Filled 2014-10-08: qty 1

## 2014-10-08 MED ORDER — MECLIZINE HCL 25 MG PO TABS: 25.0000 mg | ORAL_TABLET | Freq: Two times a day (BID) | ORAL | Status: DC | PRN

## 2014-10-08 MED ORDER — SODIUM CHLORIDE 0.9 % IV BOLUS (SEPSIS)
500.0000 mL | Freq: Once | INTRAVENOUS | Status: AC
Start: 2014-10-08 — End: 2014-10-08
  Administered 2014-10-08: 500 mL via INTRAVENOUS

## 2014-10-08 MED ORDER — POTASSIUM CHLORIDE CRYS ER 20 MEQ PO TBCR
10.0000 meq | EXTENDED_RELEASE_TABLET | Freq: Once | ORAL | Status: AC
  Administered 2014-10-08: 10 meq via ORAL
  Filled 2014-10-08: qty 1

## 2014-10-08 MED ORDER — IOHEXOL 240 MG/ML SOLN
25.0000 mL | Freq: Once | INTRAMUSCULAR | Status: DC | PRN
  Administered 2014-10-08: 25 mL via ORAL
  Filled 2014-10-08: qty 50

## 2014-10-08 MED ORDER — CARVEDILOL 3.125 MG PO TABS
12.5000 mg | ORAL_TABLET | Freq: Two times a day (BID) | ORAL | Status: DC
  Administered 2014-10-10: 11:00:00 12.5 mg via ORAL
  Filled 2014-10-08 (×3): qty 4

## 2014-10-08 MED ORDER — DEXTROSE 5 % IV SOLN
2.0000 g | Freq: Once | INTRAVENOUS | Status: AC
  Administered 2014-10-08: 2 g via INTRAVENOUS
  Filled 2014-10-08: qty 2

## 2014-10-08 MED ORDER — ATORVASTATIN CALCIUM 20 MG PO TABS
20.0000 mg | ORAL_TABLET | Freq: Every day | ORAL | Status: DC
  Administered 2014-10-08 – 2014-10-09 (×2): 20 mg via ORAL
  Filled 2014-10-08 (×2): qty 1

## 2014-10-08 MED ORDER — HEPARIN SODIUM (PORCINE) 5000 UNIT/ML IJ SOLN: 5000.0000 [IU] | Freq: Three times a day (TID) | INTRAMUSCULAR | Status: DC

## 2014-10-08 MED ORDER — DEXTROSE 5 % IV SOLN
500.0000 mg | Freq: Three times a day (TID) | INTRAVENOUS | Status: DC
  Administered 2014-10-09: 500 mg via INTRAVENOUS
  Filled 2014-10-08 (×7): qty 0.5

## 2014-10-08 MED ORDER — FLUTICASONE PROPIONATE 50 MCG/ACT NA SUSP
2.0000 | Freq: Every day | NASAL | Status: DC | PRN
  Filled 2014-10-08: qty 16

## 2014-10-08 MED ORDER — SODIUM CHLORIDE 0.9 % IV SOLN
INTRAVENOUS | Status: DC
  Administered 2014-10-08: 20:00:00 via INTRAVENOUS

## 2014-10-08 MED ORDER — ACETAMINOPHEN 325 MG PO TABS
650.0000 mg | ORAL_TABLET | Freq: Four times a day (QID) | ORAL | Status: DC | PRN
  Administered 2014-10-09: 21:00:00 650 mg via ORAL
  Filled 2014-10-08: qty 2

## 2014-10-08 MED ORDER — RENA-VITE PO TABS
1.0000 | ORAL_TABLET | Freq: Every day | ORAL | Status: DC
  Administered 2014-10-10 – 2014-10-11 (×2): 1 via ORAL
  Filled 2014-10-08 (×4): qty 1

## 2014-10-08 MED ORDER — SODIUM CHLORIDE 0.9 % IV SOLN
500.0000 mg | INTRAVENOUS | Status: DC | PRN
  Filled 2014-10-08: qty 500

## 2014-10-08 MED ORDER — FUROSEMIDE 10 MG/ML IJ SOLN: 40.0000 mg | Freq: Once | INTRAMUSCULAR | Status: DC

## 2014-10-08 MED ORDER — ONDANSETRON HCL 4 MG PO TABS: 4.0000 mg | ORAL_TABLET | Freq: Four times a day (QID) | ORAL | Status: DC | PRN

## 2014-10-08 MED ORDER — RENA-VITE PO TABS: 1.0000 | ORAL_TABLET | Freq: Every day | ORAL | Status: DC

## 2014-10-08 MED ORDER — APIXABAN 2.5 MG PO TABS
2.5000 mg | ORAL_TABLET | Freq: Two times a day (BID) | ORAL | Status: DC
  Administered 2014-10-08: 2.5 mg via ORAL
  Filled 2014-10-08 (×3): qty 1

## 2014-10-08 MED ORDER — ACETAMINOPHEN 650 MG RE SUPP: 650.0000 mg | Freq: Four times a day (QID) | RECTAL | Status: DC | PRN

## 2014-10-08 MED ORDER — BISMUTH SUBSALICYLATE 262 MG/15ML PO SUSP
30.0000 mL | ORAL | Status: DC | PRN
  Filled 2014-10-08: qty 118

## 2014-10-08 MED ORDER — TROLAMINE SALICYLATE 10 % EX CREA
1.0000 "application " | TOPICAL_CREAM | CUTANEOUS | Status: DC | PRN
  Filled 2014-10-08: qty 85

## 2014-10-08 NOTE — ED Provider Notes (Signed)
[facility] Emergency Department Provider Note   ____________________________________________  Time seen: 1530  I have reviewed the triage vital signs and the nursing notes.   HISTORY  Chief Complaint Hypotension and Vascular Access Problem   History limited by: Not Limited   HPI Jessica Duran is a 59 y.o. female who presents to the emergency department today from dialysis center because of concerns for hypotension. The patient states that she has felt unwell today. She states she has had nausea, vomiting and diarrhea. Multiple episodes of both. No obvious blood. She has had some generalized accompanying abdominal pain. She denies any fevers. In addition the patient states she has not had dialysis in 2 weeks. She states this is because they've not been able to access her AV fistula. She states she has an appointment with Dr. due tomorrow to get a shuntogram.     Past Medical History  Diagnosis Date  . History of stroke   . Hypertension   . Chronic kidney disease     stage III  . Diastolic congestive heart failure     ef 55-60%  . Fibromyalgia   . DVT (deep venous thrombosis)   . Hyperparathyroidism   . Stroke   . Diabetes mellitus without complication   . Breast cancer     Triple Negative    Patient Active Problem List   Diagnosis Date Noted  . Cerebellar infarct 05/27/2014  . ESRD (end stage renal disease) 05/27/2014  . DM (diabetes mellitus) 05/27/2014  . HTN (hypertension) 05/27/2014    Past Surgical History  Procedure Laterality Date  . Appendectomy    . Cesarean section      x3  . Breast lumpectomy Right may 2015  . Breast biopsy Right     03/27/2013 positive  . Status post radiation Left     breast cancer  . Status post chemo Left     breast cancer    Current Outpatient Rx  Name  Route  Sig  Dispense  Refill  . amLODipine (NORVASC) 10 MG tablet   Oral   Take 10 mg by mouth daily.         Marland Kitchen apixaban (ELIQUIS) 2.5 MG  TABS tablet   Oral   Take 2.5 mg by mouth 2 (two) times daily.         Marland Kitchen aspirin 81 MG tablet   Oral   Take 81 mg by mouth daily.         Marland Kitchen atorvastatin (LIPITOR) 20 MG tablet   Oral   Take 20 mg by mouth daily.         Marland Kitchen b complex-vitamin c-folic acid (NEPHRO-VITE) 0.8 MG TABS tablet   Oral   Take 1 tablet by mouth at bedtime.         . bisacodyl (DULCOLAX) 5 MG EC tablet   Oral   Take 5 mg by mouth daily as needed for moderate constipation.         . carvedilol (COREG) 6.25 MG tablet   Oral   Take 12.5 mg by mouth daily.         . cloNIDine (CATAPRES) 0.2 MG tablet   Oral   Take 0.2 mg by mouth 2 (two) times daily.         . clopidogrel (PLAVIX) 75 MG tablet   Oral   Take 75 mg by mouth daily.         . fluticasone (FLONASE) 50 MCG/ACT nasal spray  Each Nare   Place 2 sprays into both nostrils daily.         Marland Kitchen gabapentin (NEURONTIN) 300 MG capsule   Oral   Take 300 mg by mouth daily.         Marland Kitchen glipiZIDE (GLUCOTROL XL) 2.5 MG 24 hr tablet   Oral   Take 2.5 mg by mouth daily.         Marland Kitchen lidocaine (XYLOCAINE) 2 % solution   Mouth/Throat   Use as directed 10 mLs in the mouth or throat every 6 (six) hours as needed for mouth pain.         . Lidocaine-Prilocaine, Bulk, 2.5-2.5 % CREA   Does not apply   1 application by Does not apply route daily. Apply once a day every Tuesday, Thursday, Saturday for pain         . meclizine (ANTIVERT) 25 MG tablet   Oral   Take 25 mg by mouth every 12 (twelve) hours.      0   . Melatonin 3 MG TABS   Oral   Take 2 tablets by mouth daily.         . promethazine (PHENERGAN) 25 MG tablet   Oral   Take 25 mg by mouth every 6 (six) hours as needed for nausea or vomiting.         . senna-docusate (SENOKOT-S) 8.6-50 MG per tablet   Oral   Take 2 tablets by mouth every 12 (twelve) hours.         . simvastatin (ZOCOR) 20 MG tablet   Oral   Take 20 mg by mouth daily.         Marland Kitchen  sulfamethoxazole-trimethoprim (BACTRIM DS,SEPTRA DS) 800-160 MG per tablet   Oral   Take 1 tablet by mouth 2 (two) times daily.   20 tablet   no   . traMADol (ULTRAM) 50 MG tablet   Oral   Take 50 mg by mouth every 12 (twelve) hours as needed.         . trolamine salicylate (ASPERCREME) 10 % cream   Topical   Apply 1 application topically as needed for muscle pain (Apply to left shoulder topically every day and evening shift for pain).         . Vitamin D, Ergocalciferol, (DRISDOL) 50000 UNITS CAPS capsule   Oral   Take by mouth.         . zolpidem (AMBIEN) 5 MG tablet   Oral   Take 5 mg by mouth at bedtime as needed for sleep.           Allergies Acetaminophen; Hydralazine; Isosorbide; Levaquin; Lisinopril; Oxycodone; Oxycodone-acetaminophen; and Penicillins  Family History  Problem Relation Age of Onset  . Hypertension Father   . Cancer Mother     Pancreatic    Social History Social History  Substance Use Topics  . Smoking status: Former Smoker    Types: Cigarettes    Quit date: 01/25/2013  . Smokeless tobacco: None  . Alcohol Use: No    Review of Systems  Constitutional: Negative for fever. Cardiovascular: Negative for chest pain. Respiratory: Positive for shortness of breath. Gastrointestinal: Positive for generalized abdominal pain. Genitourinary: Negative for dysuria. Musculoskeletal: Positive for back pain. Skin: Negative for rash. Neurological: Negative for headaches, focal weakness or numbness.  10-point ROS otherwise negative.  ____________________________________________   PHYSICAL EXAM:  VITAL SIGNS: ED Triage Vitals  Enc Vitals Group     BP 10/06/2014 1408 115/41 mmHg  Pulse Rate 10/04/2014 1408 84     Resp 10/24/2014 1408 16     Temp 10/05/2014 1408 97.6 F (36.4 C)     Temp Source 10/21/2014 1408 Axillary     SpO2 10/09/2014 1408 98 %     Weight 10/23/2014 1408 136 lb (61.689 kg)     Height --      Head Cir --      Peak Flow --       Pain Score 10/25/2014 1410 0   Constitutional: Alert and oriented. Somewhat somnolent. Chronically ill-appearing Eyes: Conjunctivae are normal. PERRL. Normal extraocular movements. ENT   Head: Normocephalic and atraumatic.   Nose: No congestion/rhinnorhea.   Mouth/Throat: Mucous membranes are moist. Repetitive tongue movements.   Neck: No stridor. Hematological/Lymphatic/Immunilogical: No cervical lymphadenopathy. Cardiovascular: Normal rate, regular rhythm.  No murmurs, rubs, or gallops. Respiratory: Normal respiratory effort without tachypnea nor retractions. Breath sounds are clear and equal bilaterally. No wheezes/rales/rhonchi. Gastrointestinal: Soft and minimally tender to palpation generalized Genitourinary: Deferred Musculoskeletal: Normal range of motion in all extremities. No joint effusions.  No lower extremity tenderness nor edema. AV fistula in left forearm - no thrill appreciated. No bruit appreciated. No surrounding erythema.  Neurologic:  Normal speech and language. No gross focal neurologic deficits are appreciated. Speech is normal.  Skin:  Skin is warm, dry and intact. No rash noted.  ____________________________________________    LABS (pertinent positives/negatives)  Labs Reviewed  CBC WITH DIFFERENTIAL/PLATELET - Abnormal; Notable for the following:    WBC 25.0 (*)    RBC 3.21 (*)    Hemoglobin 9.3 (*)    HCT 28.5 (*)    RDW 16.0 (*)    Neutro Abs 23.4 (*)    Lymphs Abs 0.7 (*)    All other components within normal limits  BASIC METABOLIC PANEL - Abnormal; Notable for the following:    Potassium 3.0 (*)    CO2 18 (*)    Glucose, Bld 102 (*)    BUN 84 (*)    Creatinine, Ser 7.30 (*)    Calcium 8.4 (*)    GFR calc non Af Amer 5 (*)    GFR calc Af Amer 6 (*)    All other components within normal limits  TROPONIN I - Abnormal; Notable for the following:    Troponin I 0.36 (*)    All other components within normal limits  CULTURE, BLOOD  (ROUTINE X 2)  CULTURE, BLOOD (ROUTINE X 2)  URINALYSIS COMPLETEWITH MICROSCOPIC ([facility] ONLY)  LACTIC ACID, PLASMA  LACTIC ACID, PLASMA  CBC  CREATININE, SERUM  CBC  BASIC METABOLIC PANEL     ____________________________________________  EKG  I, Nance Pear, attending physician, personally viewed and interpreted this EKG  EKG Time: 1419 Rate: 84 Rhythm: NSR Axis: normal Intervals: qtc 453 QRS: narrow, q waves V1, V2, V3, low voltage QRS ST changes: no st elevation Impression: low voltage QRS, hx of septal infarct ____________________________________________    RADIOLOGY  CXR  IMPRESSION: Volume overload with cardiomegaly and moderate RIGHT pleural effusion.  ____________________________________________   PROCEDURES  Procedure(s) performed: None  Critical Care performed: Yes, see critical care note(s)  CRITICAL CARE Performed by: Nance Pear   Total critical care time: 40  Critical care time was exclusive of separately billable procedures and treating other patients.  Critical care was necessary to treat or prevent imminent or life-threatening deterioration.  Critical care was time spent personally by me on the following activities: development of treatment plan with patient and/or  surrogate as well as nursing, discussions with consultants, evaluation of patient's response to treatment, examination of patient, obtaining history from patient or surrogate, ordering and performing treatments and interventions, ordering and review of laboratory studies, ordering and review of radiographic studies, pulse oximetry and re-evaluation of patient's condition.  ____________________________________________   INITIAL IMPRESSION / ASSESSMENT AND PLAN / ED COURSE  Pertinent labs & imaging results that were available during my care of the patient were reviewed by me and considered in my medical decision making (see chart for details).  Patient presented to  the emergency department today with concerns for hypotension noticed at dialysis. Somewhat somnelent. Afebrile. Initial impression was for possible uremia, infection, fluid overload given SOB.   Blood work with elevated BUN/Cr, Troponin, WBC. Concern for sepsis. At this point would like to give fluid bolus, however am hesitant given fluid overload seen on chest film. Thus will not give full 30cc/kg bolus at this point. Will give light fluids. Will give broad spectrum antibiotics and plan on admission to the hospitalists.   Port was accessed.   ____________________________________________   FINAL CLINICAL IMPRESSION(S) / ED DIAGNOSES  Final diagnoses:  SOB (shortness of breath)  Sepsis, due to unspecified organism     Nance Pear, MD 09/30/2014 1845

## 2014-10-08 NOTE — Progress Notes (Signed)
ANTIBIOTIC CONSULT NOTE - INITIAL  Pharmacy Consult for aztreonam  Indication: rule out sepsis  Allergies  Allergen Reactions  . Acetaminophen Other (See Comments)    Reaction:  Unknown   . Hydralazine Other (See Comments)    Reaction:  Unknown   . Isosorbide Other (See Comments)    Reaction:  Unknown   . Levaquin [Levofloxacin In D5w] Other (See Comments)    Reaction:  Unknown   . Lisinopril Other (See Comments)    Reaction:  Caused pt to have a stroke.    Marland Kitchen Oxycodone Other (See Comments)    Reaction:  Unknown   . Penicillins Other (See Comments)    Reaction:  Unknown     Patient Measurements: Weight: 136 lb (61.689 kg)   Vital Signs: Temp: 97.6 F (36.4 C) (09/13 1408) Temp Source: Axillary (09/13 1408) BP: 95/65 mmHg (09/13 1614) Pulse Rate: 80 (09/13 1614) Intake/Output from previous day:   Intake/Output from this shift:    Labs:  Recent Labs  10/01/2014 1609  WBC 25.0*  HGB 9.3*  PLT 184  CREATININE 7.30*   Estimated Creatinine Clearance: 7 mL/min (by C-G formula based on Cr of 7.3). No results for input(s): VANCOTROUGH, VANCOPEAK, VANCORANDOM, GENTTROUGH, GENTPEAK, GENTRANDOM, TOBRATROUGH, TOBRAPEAK, TOBRARND, AMIKACINPEAK, AMIKACINTROU, AMIKACIN in the last 72 hours.   Microbiology: No results found for this or any previous visit (from the past 720 hour(s)).  Medical History: Past Medical History  Diagnosis Date  . History of stroke   . Hypertension   . Chronic kidney disease     stage III  . Diastolic congestive heart failure     ef 55-60%  . Fibromyalgia   . DVT (deep venous thrombosis)   . Hyperparathyroidism   . Stroke   . Diabetes mellitus without complication   . Breast cancer     Triple Negative    Medications:  Anti-infectives    Start     Dose/Rate Route Frequency Ordered Stop   10/24/2014 1800  aztreonam (AZACTAM) 2 g in dextrose 5 % 50 mL IVPB     2 g 100 mL/hr over 30 Minutes Intravenous  Once 10/07/2014 1722     10/23/2014 1715   vancomycin (VANCOCIN) IVPB 1000 mg/200 mL premix     1,000 mg 200 mL/hr over 60 Minutes Intravenous  Once 10/07/2014 1712       Assessment: Pharmacy consulted to dose aztreonam.    Plan:  Patient has a PCN allergy and has been started on aztreonam. Patient has end stage renal disease and receives intermittent HD. Per renal function, will dose a one time loading dose of aztreonam 2g IV followed by 500 mg IV q8h. Pharmacy will continue to monitor.   Thank you for the consult.   Darylene Price Tierre Netto 10/11/2014,5:45 PM

## 2014-10-08 NOTE — Progress Notes (Signed)
PHARMACIST - PHYSICIAN ORDER COMMUNICATION  CONCERNING: P&T Medication Policy on Herbal Medications  DESCRIPTION:  This patient's order for:  Melatonin 3mg   has been noted.  This product(s) is classified as an "herbal" or natural product. Due to a lack of definitive safety studies or FDA approval, nonstandard manufacturing practices, plus the potential risk of unknown drug-drug interactions while on inpatient medications, the Pharmacy and Therapeutics Committee does not permit the use of "herbal" or natural products of this type within [place].   ACTION TAKEN: The pharmacy department is unable to verify this order at this time and your patient has been informed of this safety policy. Please reevaluate patient's clinical condition at discharge and address if the herbal or natural product(s) should be resumed at that time.   

## 2014-10-08 NOTE — H&P (Signed)
[facility] NAME: Jessica Duran    MR#:  [id number]  DATE OF BIRTH:  08/02/1955  DATE OF ADMISSION:  09/26/2014  PRIMARY CARE PHYSICIAN: CAMPBELL, Creola Corn, MD   REQUESTING/REFERRING PHYSICIAN: Dr. Archie Balboa  CHIEF COMPLAINT:   Chief Complaint  Patient presents with  . Hypotension  . Vascular Access Problem    HISTORY OF PRESENT ILLNESS: Jessica Duran  is a 59 y.o. female with a known history of  end-stage renal disease Hypertension, end-stage renal disease test her diastolic CHF and CVA in the past who currently resides at a skilled nursing facility is sent from there due [facility]ension from [facility]. Patient reports that she was having nausea and throwing up earlier. Patient also complains of abdominal pain. Manuela Schwartz very poor historian and unable to provide me any history is noted to have a WBC count that is very high at 25,000.   PAST MEDICAL HISTORY:   Past Medical History  Diagnosis Date  . History of stroke   . Hypertension   . Chronic kidney disease     stage III  . Diastolic congestive heart failure     ef 55-60%  . Fibromyalgia   . DVT (deep venous thrombosis)   . Hyperparathyroidism   . Stroke   . Diabetes mellitus without complication   . Breast cancer     Triple Negative    PAST SURGICAL HISTORY:  Past Surgical History  Procedure Laterality Date  . Appendectomy    . Cesarean section      x3  . Breast lumpectomy Right may 2015  . Breast biopsy Right     03/27/2013 positive  . Status post radiation Left     breast cancer  . Status post chemo Left     breast cancer    SOCIAL HISTORY:  Social History  Substance Use Topics  . Smoking status: Former Smoker    Types: Cigarettes    Quit date: 01/25/2013  . Smokeless tobacco: Not on file  . Alcohol Use: No    FAMILY HISTORY:  Family History  Problem Relation Age of Onset  . Hypertension Father   . Cancer Mother     Pancreatic     DRUG ALLERGIES:  Allergies  Allergen Reactions  . Acetaminophen Other (See Comments)    Reaction:  Unknown   . Hydralazine Other (See Comments)    Reaction:  Unknown   . Isosorbide Other (See Comments)    Reaction:  Unknown   . Levaquin [Levofloxacin In D5w] Other (See Comments)    Reaction:  Unknown   . Lisinopril Other (See Comments)    Reaction:  Caused pt to have a stroke.    Marland Kitchen Oxycodone Other (See Comments)    Reaction:  Unknown   . Penicillins Other (See Comments)    Reaction:  Unknown     REVIEW OF SYSTEMS:  Patient unable to provide any meaningful review of system complains of some abdominal pain but is very hard to understand the patient     MEDICATIONS AT HOME:  Prior to Admission medications   Medication Sig Start Date End Date Taking? Authorizing Provider  amLODipine (NORVASC) 10 MG tablet Take 10 mg by mouth daily.   Yes Historical Provider, MD  apixaban (ELIQUIS) 2.5 MG TABS tablet Take 2.5 mg by mouth 2 (two) times daily.   Yes Historical Provider, MD  atorvastatin (LIPITOR) 20 MG tablet Take 20 mg by mouth at bedtime.  Yes Historical Provider, MD  azithromycin (ZITHROMAX) 500 MG tablet Take 500 mg by mouth at bedtime.   Yes Historical Provider, MD  b complex-vitamin c-folic acid (NEPHRO-VITE) 0.8 MG TABS tablet Take 1 tablet by mouth daily.    Yes Historical Provider, MD  bisacodyl (DULCOLAX) 5 MG EC tablet Take 10 mg by mouth daily as needed for moderate constipation.    Yes Historical Provider, MD  bismuth subsalicylate (PEPTO BISMOL) 262 MG/15ML suspension Take 30 mLs by mouth every 4 (four) hours as needed for indigestion.   Yes Historical Provider, MD  carvedilol (COREG) 12.5 MG tablet Take 12.5 mg by mouth 2 (two) times daily.   Yes Historical Provider, MD  cloNIDine (CATAPRES) 0.2 MG tablet Take 0.2 mg by mouth 2 (two) times daily.   Yes Historical Provider, MD  clopidogrel (PLAVIX) 75 MG tablet Take 75 mg by mouth daily.   Yes Historical Provider,  MD  fluticasone (FLONASE) 50 MCG/ACT nasal spray Place 2 sprays into both nostrils daily as needed for allergies.    Yes Historical Provider, MD  glipiZIDE (GLUCOTROL XL) 2.5 MG 24 hr tablet Take 2.5 mg by mouth daily.   Yes Historical Provider, MD  guaifenesin (ROBITUSSIN) 100 MG/5ML syrup Take 200 mg by mouth every 6 (six) hours as needed for cough.   Yes Historical Provider, MD  ibuprofen (ADVIL,MOTRIN) 200 MG tablet Take 400 mg by mouth every 4 (four) hours as needed for fever.   Yes Historical Provider, MD  lidocaine-prilocaine (EMLA) cream Apply 1 application topically as needed (to fistula).    Yes Historical Provider, MD  meclizine (ANTIVERT) 25 MG tablet Take 25 mg by mouth every 12 (twelve) hours as needed for dizziness.   Yes Historical Provider, MD  Melatonin 3 MG TABS Take 3 mg by mouth at bedtime as needed (for sleep).    Yes Historical Provider, MD  multivitamin (RENA-VIT) TABS tablet Take 1 tablet by mouth daily.   Yes Historical Provider, MD  nitroGLYCERIN (NITROSTAT) 0.4 MG SL tablet Place 0.4 mg under the tongue every 5 (five) minutes as needed for chest pain.   Yes Historical Provider, MD  nystatin cream (MYCOSTATIN) Apply 1 application topically every 8 (eight) hours as needed (for rash).   Yes Historical Provider, MD  ondansetron (ZOFRAN) 4 MG tablet Take 4 mg by mouth every 6 (six) hours as needed for nausea or vomiting.   Yes Historical Provider, MD  polyethylene glycol (MIRALAX / GLYCOLAX) packet Take 17 g by mouth daily as needed for mild constipation.   Yes Historical Provider, MD  promethazine (PHENERGAN) 25 MG tablet Take 25 mg by mouth every 6 (six) hours as needed for nausea or vomiting.   Yes Historical Provider, MD  promethazine (PHENERGAN) 25 MG/ML injection Inject 25 mg into the muscle every 6 (six) hours as needed for nausea or vomiting.   Yes Historical Provider, MD  senna-docusate (SENOKOT-S) 8.6-50 MG per tablet Take 2 tablets by mouth every 12 (twelve) hours.    Yes Historical Provider, MD  traMADol (ULTRAM) 50 MG tablet Take 50 mg by mouth every 12 (twelve) hours as needed for moderate pain.    Yes Historical Provider, MD  trolamine salicylate (ASPERCREME) 10 % cream Apply 1 application topically as needed for muscle pain.    Yes Historical Provider, MD  sulfamethoxazole-trimethoprim (BACTRIM DS,SEPTRA DS) 800-160 MG per tablet Take 1 tablet by mouth 2 (two) times daily. Patient not taking: Reported on 10/07/2014 05/27/14   Noreene Filbert, MD  PHYSICAL EXAMINATION:   VITAL SIGNS: Blood pressure 95/65, pulse 80, temperature 97.6 F (36.4 C), temperature source Axillary, resp. rate 14, weight 61.689 kg (136 lb), SpO2 100 %.  GENERAL:  59 y.o.-year-old patient lying in the bed with no acute distress.  EYES: Pupils equal, round, reactive to light and accommodation. No scleral icterus. Extraocular muscles intact.  HEENT: Head atraumatic, normocephalic. Oropharynx and nasopharynx clear.  NECK:  Supple, no jugular venous distention. No thyroid enlargement, no tenderness.  LUNGS: Normal breath sounds bilaterally, no wheezing, rales,rhonchi or crepitation. No use of accessory muscles of respiration.  CARDIOVASCULAR: S1, S2 normal. No murmurs, rubs, or gallops.  ABDOMEN: Soft, nontender, nondistended. Bowel sounds present. No organomegaly or mass.  EXTREMITIES: No pedal edema, cyanosis, or clubbing.  NEUROLOGIC: Cranial nerves II through XII are intact. Muscle strength 5/5 in all extremities. Sensation intact. Gait not checked.  PSYCHIATRIC: Patient is awake and oriented to person SKIN: Stage I decubitus ulcer which is very small in size size of a dime without any drainage  LABORATORY PANEL:   CBC  Recent Labs Lab 10/20/2014 1609  WBC 25.0*  HGB 9.3*  HCT 28.5*  PLT 184  MCV 88.7  MCH 28.9  MCHC 32.6  RDW 16.0*  LYMPHSABS 0.7*  MONOABS 0.9  EOSABS 0.0  BASOSABS 0.0    ------------------------------------------------------------------------------------------------------------------  Chemistries   Recent Labs Lab 10/17/2014 1609  NA 137  K 3.0*  CL 104  CO2 18*  GLUCOSE 102*  BUN 84*  CREATININE 7.30*  CALCIUM 8.4*   ------------------------------------------------------------------------------------------------------------------ estimated creatinine clearance is 7 mL/min (by C-G formula based on Cr of 7.3). ------------------------------------------------------------------------------------------------------------------ No results for input(s): TSH, T4TOTAL, T3FREE, THYROIDAB in the last 72 hours.  Invalid input(s): FREET3   Coagulation profile No results for input(s): INR, PROTIME in the last 168 hours. ------------------------------------------------------------------------------------------------------------------- No results for input(s): DDIMER in the last 72 hours. -------------------------------------------------------------------------------------------------------------------  Cardiac Enzymes  Recent Labs Lab 10/07/2014 1609  TROPONINI 0.36*   ------------------------------------------------------------------------------------------------------------------ Invalid input(s): POCBNP  ---------------------------------------------------------------------------------------------------------------  Urinalysis    Component Value Date/Time   COLORURINE STRAW* 06/06/2014 0805   COLORURINE Yellow 01/17/2014 0849   APPEARANCEUR CLEAR* 06/06/2014 0805   APPEARANCEUR Clear 01/17/2014 0849   LABSPEC 1.008 06/06/2014 0805   LABSPEC 1.009 01/17/2014 0849   PHURINE 8.0 06/06/2014 0805   PHURINE 9.0 01/17/2014 0849   GLUCOSEU NEGATIVE 06/06/2014 0805   GLUCOSEU Negative 01/17/2014 0849   HGBUR NEGATIVE 06/06/2014 0805   HGBUR Negative 01/17/2014 0849   BILIRUBINUR NEGATIVE 06/06/2014 0805   BILIRUBINUR Negative 01/17/2014 0849    KETONESUR NEGATIVE 06/06/2014 0805   KETONESUR Negative 01/17/2014 0849   PROTEINUR 30* 06/06/2014 0805   PROTEINUR >=500 01/17/2014 0849   NITRITE NEGATIVE 06/06/2014 0805   NITRITE Negative 01/17/2014 0849   LEUKOCYTESUR NEGATIVE 06/06/2014 0805   LEUKOCYTESUR Negative 01/17/2014 0849     RADIOLOGY: Dg Chest Port 1 View  10/20/2014   CLINICAL DATA:  Short of breath. Patient missed dialysis. Volume overload.  EXAM: PORTABLE CHEST - 1 VIEW  COMPARISON:  12/14/2014.  FINDINGS: There is a moderate RIGHT pleural effusion. This layers posteriorly and produces hazy opacity over the entire RIGHT chest. The cardiopericardial silhouette is enlarged. LEFT IJ power port is present. Aortic atherosclerosis. Collapse/ consolidation of the RIGHT lower lobe associated with pleural effusion. No LEFT pleural effusion.  When compared to 12/13/2013, the RIGHT IJ dialysis catheter has been removed.  IMPRESSION: Volume overload with cardiomegaly and moderate RIGHT pleural effusion.   Electronically Signed  By: Dereck Ligas M.D.   On: 09/27/2014 16:45    EKG: Orders placed or performed during the hospital encounter of 10/17/2014  . ED EKG  . ED EKG    IMPRESSION AND PLAN: Patient is a 59 year old fraternal female who is sent from dialysis Center for hypotension  1. 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 Also 10 a CT of the abdomen and pelvis due to abdominal pain  2. Diabetes 2  Sliding scale insulin hold hold glipizide  3. Hypertension due to hypotension will hold her blood pressure medications  4.  Chronic AC- coninue ELIQUIS    5. ESRD nephorlogy consult for hd  6. Hypokalemia low dose K+      All the records are reviewed and case discussed with ED provider. Management plans discussed with the patient, family and they are in agreement.  CODE STATUS:    Code Status Orders         Start     Ordered   10/04/2014 1825  Full code   Continuous     10/23/2014 1825       TOTAL TIME TAKING CARE OF THIS PATIENT: 55 minutes.    Dustin Flock M.D on 10/07/2014 at 6:34 PM  Between 7am to 6pm - Pager - [phone]  After 6pm go to [email or web address] - password [email or web address] [facility]  Office  [phone]  CC: Primary care physician; CAMPBELL, Creola Corn, MD

## 2014-10-08 NOTE — Progress Notes (Signed)
ANTIBIOTIC CONSULT NOTE - INITIAL  Pharmacy Consult for vancomycin Indication: rule out sepsis  Allergies  Allergen Reactions  . Acetaminophen Other (See Comments)    Reaction:  Unknown   . Hydralazine Other (See Comments)    Reaction:  Unknown   . Isosorbide Other (See Comments)    Reaction:  Unknown   . Levaquin [Levofloxacin In D5w] Other (See Comments)    Reaction:  Unknown   . Lisinopril Other (See Comments)    Reaction:  Caused pt to have a stroke.    Marland Kitchen Oxycodone Other (See Comments)    Reaction:  Unknown   . Penicillins Other (See Comments)    Reaction:  Unknown     Patient Measurements: Weight: 136 lb (61.689 kg) Adjusted Body Weight: 53.4 kg  Vital Signs: Temp: 97.6 F (36.4 C) (09/13 1408) Temp Source: Axillary (09/13 1408) BP: 93/58 mmHg (09/13 1830) Pulse Rate: 76 (09/13 1830) Intake/Output from previous day:   Intake/Output from this shift:    Labs:  Recent Labs  10/11/2014 1609  WBC 25.0*  HGB 9.3*  PLT 184  CREATININE 7.30*   Estimated Creatinine Clearance: 7 mL/min (by C-G formula based on Cr of 7.3). No results for input(s): VANCOTROUGH, VANCOPEAK, VANCORANDOM, GENTTROUGH, GENTPEAK, GENTRANDOM, TOBRATROUGH, TOBRAPEAK, TOBRARND, AMIKACINPEAK, AMIKACINTROU, AMIKACIN in the last 72 hours.   Microbiology: No results found for this or any previous visit (from the past 720 hour(s)).  Medical History: Past Medical History  Diagnosis Date  . History of stroke   . Hypertension   . Chronic kidney disease     stage III  . Diastolic congestive heart failure     ef 55-60%  . Fibromyalgia   . DVT (deep venous thrombosis)   . Hyperparathyroidism   . Stroke   . Diabetes mellitus without complication   . Breast cancer     Triple Negative    Medications:  Anti-infectives    Start     Dose/Rate Route Frequency Ordered Stop   10/17/2014 0900  vancomycin (VANCOCIN) 500 mg in sodium chloride 0.9 % 100 mL IVPB     500 mg 100 mL/hr over 60 Minutes  Intravenous Every Dialysis 10/22/2014 1936     10/08/2014 0200  aztreonam (AZACTAM) 500 mg in dextrose 5 % 50 mL IVPB     500 mg 100 mL/hr over 30 Minutes Intravenous 3 times per day 10/17/2014 1751     10/22/2014 1800  aztreonam (AZACTAM) 2 g in dextrose 5 % 50 mL IVPB     2 g 100 mL/hr over 30 Minutes Intravenous  Once 10/07/2014 1722 10/06/2014 1847   10/18/2014 1715  vancomycin (VANCOCIN) IVPB 1000 mg/200 mL premix     1,000 mg 200 mL/hr over 60 Minutes Intravenous  Once 10/16/2014 1712 10/25/2014 1915     Assessment: Pharmacy consulted to dose vancomycin  Goal of Therapy:  Vancomycin trough level 15-20 mcg/ml  Plan:  Patient is on HD - was scheduled for HD today but was unable to receive dialysis due to hypotension. A one time dose of vancomycin 1g IV was given in the ED. Based on the patient's weight, scheduled maintenance dose of 500 mg IV to be given at the end of HD. Once HD schedule is determined, pharmacy will order trough once at steady state. Pharmacy will continue to monitor and adjust dose as needed.  Blood cultures obtained in ED and are pending.  Thank you for the consult.  Darylene Price Sae Handrich 09/28/2014,7:37 PM

## 2014-10-08 NOTE — ED Notes (Addendum)
Pt to ed via ems from dialysis center. Pt did not recv dialysis due to low blood pressure. Dialysis unable to access catheter today. Pt denies any pain at this time.

## 2014-10-09 ENCOUNTER — Inpatient Hospital Stay: Payer: Medicare Other

## 2014-10-09 ENCOUNTER — Encounter: Admission: EM | Disposition: E | Payer: Self-pay | Source: Home / Self Care | Attending: Internal Medicine

## 2014-10-09 ENCOUNTER — Ambulatory Visit: Admission: RE | Admit: 2014-10-09 | Payer: Medicare Other | Source: Ambulatory Visit | Admitting: Vascular Surgery

## 2014-10-09 ENCOUNTER — Inpatient Hospital Stay: Payer: Medicare Other | Attending: Internal Medicine

## 2014-10-09 DIAGNOSIS — L8990 Pressure ulcer of unspecified site, unspecified stage: Secondary | ICD-10-CM | POA: Insufficient documentation

## 2014-10-09 LAB — PROTEIN, BODY FLUID: Total protein, fluid: 3.2 g/dL

## 2014-10-09 LAB — PROTIME-INR
INR: 7.94
Prothrombin Time: 66 seconds — ABNORMAL HIGH (ref 11.4–15.0)

## 2014-10-09 LAB — BASIC METABOLIC PANEL
Anion gap: 12 (ref 5–15)
BUN: 84 mg/dL — AB (ref 6–20)
CALCIUM: 7.7 mg/dL — AB (ref 8.9–10.3)
CHLORIDE: 103 mmol/L (ref 101–111)
CO2: 19 mmol/L — AB (ref 22–32)
CREATININE: 7 mg/dL — AB (ref 0.44–1.00)
GFR calc Af Amer: 7 mL/min — ABNORMAL LOW (ref >60)
GFR calc non Af Amer: 6 mL/min — ABNORMAL LOW (ref >60)
GLUCOSE: 152 mg/dL — AB (ref 65–99)
Potassium: 2.7 mmol/L — CL (ref 3.5–5.1)
Sodium: 134 mmol/L — ABNORMAL LOW (ref 135–145)

## 2014-10-09 LAB — BODY FLUID CELL COUNT WITH DIFFERENTIAL
EOS FL: 0 %
Lymphs, Fluid: 1 %
Monocyte-Macrophage-Serous Fluid: 40 %
Neutrophil Count, Fluid: 59 %
OTHER CELLS FL: 0 %
WBC FLUID: 213 uL

## 2014-10-09 LAB — TROPONIN I: Troponin I: 0.05 ng/mL — ABNORMAL HIGH (ref <0.031)

## 2014-10-09 LAB — AMYLASE: Amylase: 22 U/L — ABNORMAL LOW (ref 28–100)

## 2014-10-09 LAB — COMPREHENSIVE METABOLIC PANEL
ALBUMIN: 1.9 g/dL — AB (ref 3.5–5.0)
ALK PHOS: 78 U/L (ref 38–126)
ALT: 8 U/L — AB (ref 14–54)
AST: 25 U/L (ref 15–41)
Anion gap: 14 (ref 5–15)
BILIRUBIN TOTAL: 1 mg/dL (ref 0.3–1.2)
BUN: 89 mg/dL — AB (ref 6–20)
CALCIUM: 7.6 mg/dL — AB (ref 8.9–10.3)
CO2: 16 mmol/L — AB (ref 22–32)
Chloride: 103 mmol/L (ref 101–111)
Creatinine, Ser: 6.99 mg/dL — ABNORMAL HIGH (ref 0.44–1.00)
GFR calc Af Amer: 7 mL/min — ABNORMAL LOW (ref >60)
GFR calc non Af Amer: 6 mL/min — ABNORMAL LOW (ref >60)
GLUCOSE: 157 mg/dL — AB (ref 65–99)
Potassium: 3 mmol/L — ABNORMAL LOW (ref 3.5–5.1)
SODIUM: 133 mmol/L — AB (ref 135–145)
TOTAL PROTEIN: 5.2 g/dL — AB (ref 6.5–8.1)

## 2014-10-09 LAB — CBC
HEMATOCRIT: 25.7 % — AB (ref 35.0–47.0)
HEMOGLOBIN: 8.5 g/dL — AB (ref 12.0–16.0)
MCH: 29 pg (ref 26.0–34.0)
MCHC: 32.9 g/dL (ref 32.0–36.0)
MCV: 88 fL (ref 80.0–100.0)
Platelets: 174 10*3/uL (ref 150–440)
RBC: 2.92 MIL/uL — ABNORMAL LOW (ref 3.80–5.20)
RDW: 16 % — AB (ref 11.5–14.5)
WBC: 20.2 10*3/uL — ABNORMAL HIGH (ref 3.6–11.0)

## 2014-10-09 LAB — ABO/RH: ABO/RH(D): O POS

## 2014-10-09 LAB — LACTATE DEHYDROGENASE, PLEURAL OR PERITONEAL FLUID: LD FL: 70 U/L — AB (ref 3–23)

## 2014-10-09 LAB — GLUCOSE, CAPILLARY
GLUCOSE-CAPILLARY: 176 mg/dL — AB (ref 65–99)
GLUCOSE-CAPILLARY: 183 mg/dL — AB (ref 65–99)
Glucose-Capillary: 153 mg/dL — ABNORMAL HIGH (ref 65–99)
Glucose-Capillary: 165 mg/dL — ABNORMAL HIGH (ref 65–99)

## 2014-10-09 LAB — TYPE AND SCREEN
ABO/RH(D): O POS
Antibody Screen: NEGATIVE

## 2014-10-09 LAB — MRSA PCR SCREENING: MRSA BY PCR: POSITIVE — AB

## 2014-10-09 LAB — C DIFFICILE QUICK SCREEN W PCR REFLEX
C Diff antigen: POSITIVE — AB
C Diff interpretation: POSITIVE
C Diff toxin: POSITIVE — AB

## 2014-10-09 LAB — GLUCOSE, SEROUS FLUID: Glucose, Fluid: 152 mg/dL

## 2014-10-09 LAB — CHOLESTEROL, TOTAL: Cholesterol: 82 mg/dL (ref 0–200)

## 2014-10-09 SURGERY — A/V SHUNTOGRAM/FISTULAGRAM
Anesthesia: Moderate Sedation | Laterality: Left

## 2014-10-09 MED ORDER — MUPIROCIN 2 % EX OINT
1.0000 "application " | TOPICAL_OINTMENT | Freq: Two times a day (BID) | CUTANEOUS | Status: DC
  Administered 2014-10-09 – 2014-10-12 (×7): 1 via NASAL
  Filled 2014-10-09 (×2): qty 22

## 2014-10-09 MED ORDER — INSULIN ASPART 100 UNIT/ML ~~LOC~~ SOLN
0.0000 [IU] | SUBCUTANEOUS | Status: DC
  Administered 2014-10-09 – 2014-10-10 (×2): 4 [IU] via SUBCUTANEOUS
  Administered 2014-10-10 (×2): 2 [IU] via SUBCUTANEOUS
  Administered 2014-10-10: 4 [IU] via SUBCUTANEOUS
  Administered 2014-10-11 (×5): 2 [IU] via SUBCUTANEOUS
  Administered 2014-10-12: 8 [IU] via SUBCUTANEOUS
  Administered 2014-10-12 (×2): 2 [IU] via SUBCUTANEOUS
  Administered 2014-10-12: 4 [IU] via SUBCUTANEOUS
  Filled 2014-10-09: qty 2
  Filled 2014-10-09 (×2): qty 4
  Filled 2014-10-09: qty 2
  Filled 2014-10-09: qty 4
  Filled 2014-10-09 (×6): qty 2
  Filled 2014-10-09: qty 4
  Filled 2014-10-09: qty 8
  Filled 2014-10-09 (×2): qty 2

## 2014-10-09 MED ORDER — VITAMIN K1 10 MG/ML IJ SOLN
2.0000 mg | Freq: Once | INTRAMUSCULAR | Status: AC
  Administered 2014-10-09: 2 mg via SUBCUTANEOUS
  Filled 2014-10-09: qty 0.2

## 2014-10-09 MED ORDER — CLINDAMYCIN PHOSPHATE 300 MG/50ML IV SOLN
300.0000 mg | Freq: Once | INTRAVENOUS | Status: DC
  Filled 2014-10-09: qty 50

## 2014-10-09 MED ORDER — SODIUM CHLORIDE 0.9 % IV SOLN: INTRAVENOUS | Status: DC

## 2014-10-09 MED ORDER — CHLORHEXIDINE GLUCONATE CLOTH 2 % EX PADS
6.0000 | MEDICATED_PAD | Freq: Once | CUTANEOUS | Status: AC
  Administered 2014-10-09: 15:00:00 6 via TOPICAL

## 2014-10-09 MED ORDER — SODIUM CHLORIDE 0.9 % IV SOLN
Freq: Once | INTRAVENOUS | Status: AC
  Administered 2014-10-10: 12:00:00 via INTRAVENOUS

## 2014-10-09 MED ORDER — KCL IN DEXTROSE-NACL 20-5-0.45 MEQ/L-%-% IV SOLN
INTRAVENOUS | Status: DC
  Administered 2014-10-09 – 2014-10-11 (×2): via INTRAVENOUS
  Filled 2014-10-09 (×6): qty 1000

## 2014-10-09 MED ORDER — VANCOMYCIN 50 MG/ML ORAL SOLUTION
250.0000 mg | Freq: Four times a day (QID) | ORAL | Status: DC
  Administered 2014-10-09 – 2014-10-11 (×7): 250 mg via ORAL
  Filled 2014-10-09 (×11): qty 5

## 2014-10-09 MED ORDER — MORPHINE SULFATE (PF) 2 MG/ML IV SOLN
INTRAVENOUS | Status: AC
  Filled 2014-10-09: qty 1

## 2014-10-09 MED ORDER — CHLORHEXIDINE GLUCONATE CLOTH 2 % EX PADS
6.0000 | MEDICATED_PAD | Freq: Every day | CUTANEOUS | Status: DC
  Administered 2014-10-09 – 2014-10-12 (×4): 6 via TOPICAL

## 2014-10-09 NOTE — Consult Note (Signed)
ANTIBIOTIC CONSULT NOTE - INITIAL  Pharmacy Consult for levaquin Indication: intra abdominal infection  Allergies  Allergen Reactions  . Acetaminophen Other (See Comments)    Reaction:  Unknown   . Hydralazine Other (See Comments)    Reaction:  Unknown   . Isosorbide Other (See Comments)    Reaction:  Unknown   . Levaquin [Levofloxacin In D5w] Other (See Comments)    Reaction:  Unknown   . Lisinopril Other (See Comments)    Reaction:  Caused pt to have a stroke.    Marland Kitchen Oxycodone Other (See Comments)    Reaction:  Unknown   . Penicillins Other (See Comments)    Reaction:  Unknown     Patient Measurements: Height: 5\' 4"  (162.6 cm) Weight: 118 lb 6.4 oz (53.706 kg) IBW/kg (Calculated) : 54.7 Adjusted Body Weight:   Vital Signs: Temp: 97.4 F (36.3 C) (09/14 0453) Temp Source: Oral (09/14 0453) BP: 100/64 mmHg (09/14 0453) Pulse Rate: 81 (09/14 0453) Intake/Output from previous day:   Intake/Output from this shift:    Labs:  Recent Labs  10/21/2014 1609 10/08/2014 0419  WBC 25.0* 20.2*  HGB 9.3* 8.5*  PLT 184 174  CREATININE 7.30* 7.00*   Estimated Creatinine Clearance: 7.3 mL/min (by C-G formula based on Cr of 7). No results for input(s): VANCOTROUGH, VANCOPEAK, VANCORANDOM, GENTTROUGH, GENTPEAK, GENTRANDOM, TOBRATROUGH, TOBRAPEAK, TOBRARND, AMIKACINPEAK, AMIKACINTROU, AMIKACIN in the last 72 hours.   Microbiology: Recent Results (from the past 720 hour(s))  Blood culture (routine x 2)     Status: None (Preliminary result)   Collection Time: 09/29/2014  5:54 PM  Result Value Ref Range Status   Specimen Description BLOOD RIGHT ARM  Final   Special Requests BOTTLES DRAWN AEROBIC AND ANAEROBIC  3CC  Final   Culture NO GROWTH < 24 HOURS  Final   Report Status PENDING  Incomplete  Blood culture (routine x 2)     Status: None (Preliminary result)   Collection Time: 09/30/2014  5:54 PM  Result Value Ref Range Status   Specimen Description BLOOD PORTA CATH  Final   Special Requests BOTTLES DRAWN AEROBIC AND ANAEROBIC  2CC  Final   Culture NO GROWTH < 24 HOURS  Final   Report Status PENDING  Incomplete  MRSA PCR Screening     Status: Abnormal   Collection Time: 10/08/2014 12:17 AM  Result Value Ref Range Status   MRSA by PCR POSITIVE (A) NEGATIVE Final    Comment:        The GeneXpert MRSA Assay (FDA approved for NASAL specimens only), is one component of a comprehensive MRSA colonization surveillance program. It is not intended to diagnose MRSA infection nor to guide or monitor treatment for MRSA infections. CRITICAL RESULT CALLED TO, READ BACK BY AND VERIFIED WITH: AMY TIPPETT RN     Medical History: Past Medical History  Diagnosis Date  . History of stroke   . Hypertension   . Chronic kidney disease     stage III  . Diastolic congestive heart failure     ef 55-60%  . Fibromyalgia   . DVT (deep venous thrombosis)   . Hyperparathyroidism   . Stroke   . Diabetes mellitus without complication   . 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 Renal insufficiency     Medications:  Scheduled:  . apixaban  2.5 mg Oral BID  . atorvastatin  20 mg Oral QHS  . aztreonam  500 mg Intravenous 3 times per day  .  carvedilol  12.5 mg Oral BID  . Chlorhexidine Gluconate Cloth  6 each Topical Q0600  . Chlorhexidine Gluconate Cloth  6 each Topical Once  . clindamycin (CLEOCIN) IV  300 mg Intravenous Once  . clopidogrel  75 mg Oral Daily  . insulin aspart  0-24 Units Subcutaneous 6 times per day  . morphine      . multivitamin  1 tablet Oral Daily  . mupirocin ointment  1 application Nasal BID   Assessment: Pt is a 59 year old female on HD with suspected intra-abdominal infection. CT showed possible colitis  Goal of Therapy:  resolution of infection  Plan:  Levaquin 750mg  iv once then 500mg  q 48 hours  Nolene Rocks D Avram Danielson 10/13/2014,12:49 PM

## 2014-10-09 NOTE — Progress Notes (Signed)
Palliative Care Update  Palliative Care Consult is initiated.  Patient's daughters are hard to reach.  Patient's boyfriend is the most involved and reachable caregiver.   He says pt is DNR --per her wishes. He says she has a 'form on her refrigerator' saying DNR.  He says its 'supposed to be on record'.  He clearly defined what this means and how she said 'just let me go if I am gone' when she decided to be 'No Code Blue' status.    Pt is waking up but not able to talk sensibly yet.    I am changing her to DNR.   Full note to follow.

## 2014-10-09 NOTE — Clinical Social Work Note (Signed)
Clinical Social Work Assessment  Patient Details  Name: Jessica Duran MRN: [id number] Date of Birth: [birth year]  Date of referral:  10/02/14               Reason for consult:  Facility Placement, Other (Comment Required) (From [facility] long term care)                Permission sought to share information with:  Chartered certified accountant granted to share information::  Yes, Verbal Permission Granted  Name::      [place]::   [place]   Relationship::     Contact Information:     Housing/Transportation Living arrangements for the past 2 months:  [place] of Information:  Facility, Other (Comment Required) ([facility] ) Patient Interpreter Needed:  None Criminal Activity/Legal Involvement Pertinent to Current Situation/Hospitalization:  No - Comment as needed Significant Relationships:  Adult Children, Siblings, Significant Other Lives with:  Facility Resident Do you feel safe going back to the place where you live?  Yes Need for family participation in patient care:  Yes (Comment)  Care giving concerns: Patient is a long term care resident at [facility].    Social Worker assessment / plan: Holiday representative ([place]) received verbal consult from RN that patient is from [employer]. CSW attempted to meet with patient however she was off the floor. Per RN patient is not alert and oriented. CSW contacted patient's sister Jessica Duran [phone] who lives in [place] to complete assessment. Per sister patient has 3 daughters that live in [place] and they do not call or visit their mother often. Patient also has another sister Jessica Duran [phone] who lives in [place]. Per sister there is nobody that is HPOA. Sister is agreeable for patient to return to [facility].    Per [facility] admissions coordinator at [facility] patient often refuses dialysis. Per Helene Kelp patient  can return to [facility] when stable. FL2 complete.    Employment status:  Disabled (Comment on whether or not currently receiving Disability), Retired Forensic scientist:  Medicare, Medicaid In [place] PT Recommendations:  Not assessed at this time Information / Referral to community resources:  [place]  Patient/Family's Response to care: Patient's sister Jessica Duran is agreeable for patient to return to [facility].   Patient/Family's Understanding of and Emotional Response to Diagnosis, Current Treatment, and Prognosis: Patient's sister Jessica Duran was pleasant and thanked CSW for visit.   Emotional Assessment Appearance:    Attitude/Demeanor/Rapport:  Unable to Assess Affect (typically observed):  Unable to Assess Orientation:  Fluctuating Orientation (Suspected and/or reported Sundowners) Alcohol / Substance use:  Not Applicable Psych involvement (Current and /or in the community):  No (Comment)  Discharge Needs  Concerns to be addressed:  Discharge Planning Concerns Readmission within the last 30 days:  No Current discharge risk:  Chronically ill Barriers to Discharge:  Continued Medical Work up   Loralyn Freshwater, LCSW 10/03/2014, 4:21 PM

## 2014-10-09 NOTE — Consult Note (Signed)
[facility] Infectious Disease     Reason for Consult:  Sepsis  Referring Physician: Nicholes Mango Date of Admission:  10/19/2014   Active Problems:   Hypotension   Pressure ulcer   HPI: Jessica Duran is a 59 y.o. female with a known history of end-stage renal disease, hypertension, diastolic CHF and CVA in the past who currently resides at a skilled nursing facility and iwas admited with hypotension for HD center. Also had nausea and vomiting.  Found to have WBC  25. Husband at bedside. Says she had been on abx recently for PNA. Also she has complained of RUE swelling and pain. Not sure how long she has had diarrhea.  Has poor po intake.   Past Medical History  Diagnosis Date  . History of stroke   . Hypertension   . Chronic kidney disease     stage III  . Diastolic congestive heart failure     ef 55-60%  . Fibromyalgia   . DVT (deep venous thrombosis)   . Hyperparathyroidism   . Stroke   . Diabetes mellitus without complication   . 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 Renal insufficiency    Past Surgical History  Procedure Laterality Date  . Appendectomy    . Cesarean section      x3  . Breast lumpectomy Right may 2015  . Breast biopsy Right     03/27/2013 positive  . Status post radiation Left     breast cancer  . Status post chemo Left     breast cancer   Social History  Substance Use Topics  . Smoking status: Former Smoker    Types: Cigarettes    Quit date: 01/25/2013  . Smokeless tobacco: None  . Alcohol Use: No   Family History  Problem Relation Age of Onset  . Hypertension Father   . Cancer Mother     Pancreatic    Allergies:  Allergies  Allergen Reactions  . Acetaminophen Other (See Comments)    Reaction:  Unknown   . Hydralazine Other (See Comments)    Reaction:  Unknown   . Isosorbide Other (See Comments)    Reaction:  Unknown   . Levaquin [Levofloxacin In D5w] Other (See Comments)    Reaction:  Unknown   . Lisinopril Other (See  Comments)    Reaction:  Caused pt to have a stroke.    Marland Kitchen Oxycodone Other (See Comments)    Reaction:  Unknown   . Penicillins Other (See Comments)    Reaction:  Unknown     Current antibiotics: Antibiotics Given (last 72 hours)    Date/Time Action Medication Dose Rate   10/07/2014 0259 Given   aztreonam (AZACTAM) 500 mg in dextrose 5 % 50 mL IVPB 500 mg 100 mL/hr      MEDICATIONS: . sodium chloride   Intravenous Once  . atorvastatin  20 mg Oral QHS  . aztreonam  500 mg Intravenous 3 times per day  . carvedilol  12.5 mg Oral BID  . Chlorhexidine Gluconate Cloth  6 each Topical Q0600  . clindamycin (CLEOCIN) IV  300 mg Intravenous Once  . clopidogrel  75 mg Oral Daily  . insulin aspart  0-24 Units Subcutaneous 6 times per day  . morphine      . multivitamin  1 tablet Oral Daily  . mupirocin ointment  1 application Nasal BID  . phytonadione  2 mg Subcutaneous Once   Review of Systems -lethargic  OBJECTIVE:  Temp:  [97.4 F (36.3 C)-98.6 F (37 C)] 97.4 F (36.3 C) (09/14 0453) Pulse Rate:  [76-87] 87 (09/14 1445) Resp:  [9-20] 12 (09/14 1445) BP: (84-118)/(52-67) 84/52 mmHg (09/14 1445) SpO2:  [94 %-100 %] 95 % (09/14 1445) Weight:  [53.706 kg (118 lb 6.4 oz)] 53.706 kg (118 lb 6.4 oz) (09/13 2141) Physical Exam  Constitutional:  Lethargic,  HENT: [place]/AT, PERRLA, no scleral icterus Mouth/Throat: Oropharynx is clear and dry . No oropharyngeal exudate.  Cardiovascular: Normal rate, regular rhythm and normal heart sounds. Pulmonary/Chest: Effort normal and breath sounds normal. No respiratory distress.  has no wheezes.  Neck  supple, no nuchal rigidity Abdominal: Soft. Bowel sounds are hyperactive. Mild diffuse ttp .   Lymphadenopathy: no cervical adenopathy. No axillary adenopathy Neurological: lethargic Skin: Skin is warm and dry. No rash noted. No erythema.  Ext RUE with 2+ edema LUE with avf in forearm - no tenderness or warmth - poor thrill.  Access- L chest wall  with portacath - site wnl  LABS: Results for orders placed or performed during the hospital encounter of 09/26/2014 (from the past 48 hour(s))  CBC with Differential     Status: Abnormal   Collection Time: 10/02/2014  4:09 PM  Result Value Ref Range   WBC 25.0 (H) 3.6 - 11.0 K/uL   RBC 3.21 (L) 3.80 - 5.20 MIL/uL   Hemoglobin 9.3 (L) 12.0 - 16.0 g/dL   HCT 28.5 (L) 35.0 - 47.0 %   MCV 88.7 80.0 - 100.0 fL   MCH 28.9 26.0 - 34.0 pg   MCHC 32.6 32.0 - 36.0 g/dL   RDW 16.0 (H) 11.5 - 14.5 %   Platelets 184 150 - 440 K/uL   Neutrophils Relative % 93 %   Neutro Abs 23.4 (H) 1.4 - 6.5 K/uL   Lymphocytes Relative 3 %   Lymphs Abs 0.7 (L) 1.0 - 3.6 K/uL   Monocytes Relative 4 %   Monocytes Absolute 0.9 0.2 - 0.9 K/uL   Eosinophils Relative 0 %   Eosinophils Absolute 0.0 0 - 0.7 K/uL   Basophils Relative 0 %   Basophils Absolute 0.0 0 - 0.1 K/uL  Basic metabolic panel     Status: Abnormal   Collection Time: 10/25/2014  4:09 PM  Result Value Ref Range   Sodium 137 135 - 145 mmol/L   Potassium 3.0 (L) 3.5 - 5.1 mmol/L   Chloride 104 101 - 111 mmol/L   CO2 18 (L) 22 - 32 mmol/L   Glucose, Bld 102 (H) 65 - 99 mg/dL   BUN 84 (H) 6 - 20 mg/dL   Creatinine, Ser 7.30 (H) 0.44 - 1.00 mg/dL   Calcium 8.4 (L) 8.9 - 10.3 mg/dL   GFR calc non Af Amer 5 (L) >60 mL/min   GFR calc Af Amer 6 (L) >60 mL/min    Comment: (NOTE) The eGFR has been calculated using the CKD EPI equation. This calculation has not been validated in all clinical situations. eGFR's persistently <60 mL/min signify possible Chronic Kidney Disease.    Anion gap 15 5 - 15  Troponin I     Status: Abnormal   Collection Time: 10/06/2014  4:09 PM  Result Value Ref Range   Troponin I 0.36 (H) <0.031 ng/mL    Comment: READ BACK AND VERIFIED WITH FELECIA GRAINGER  ON 09/28/2014 AT 1700PM BY TB.        PERSISTENTLY INCREASED TROPONIN VALUES IN THE RANGE OF 0.04-0.49 ng/mL CAN BE SEEN IN:       -  UNSTABLE ANGINA       -CONGESTIVE HEART  FAILURE       -MYOCARDITIS       -CHEST TRAUMA       -ARRYHTHMIAS       -LATE PRESENTING MYOCARDIAL INFARCTION       -COPD   CLINICAL FOLLOW-UP RECOMMENDED.   Blood culture (routine x 2)     Status: None (Preliminary result)   Collection Time: 10/11/2014  5:54 PM  Result Value Ref Range   Specimen Description BLOOD RIGHT ARM    Special Requests BOTTLES DRAWN AEROBIC AND ANAEROBIC  3CC    Culture NO GROWTH < 24 HOURS    Report Status PENDING   Blood culture (routine x 2)     Status: None (Preliminary result)   Collection Time: 10/11/2014  5:54 PM  Result Value Ref Range   Specimen Description BLOOD PORTA CATH    Special Requests BOTTLES DRAWN AEROBIC AND ANAEROBIC  2CC    Culture NO GROWTH < 24 HOURS    Report Status PENDING   Lactic acid, plasma     Status: None   Collection Time: 10/06/2014  5:54 PM  Result Value Ref Range   Lactic Acid, Venous 0.8 0.5 - 2.0 mmol/L  Lactic acid, plasma     Status: None   Collection Time: 10/06/2014  8:19 PM  Result Value Ref Range   Lactic Acid, Venous 0.9 0.5 - 2.0 mmol/L  MRSA PCR Screening     Status: Abnormal   Collection Time: 09/26/2014 12:17 AM  Result Value Ref Range   MRSA by PCR POSITIVE (A) NEGATIVE    Comment:        The GeneXpert MRSA Assay (FDA approved for NASAL specimens only), is one component of a comprehensive MRSA colonization surveillance program. It is not intended to diagnose MRSA infection nor to guide or monitor treatment for MRSA infections. CRITICAL RESULT CALLED TO, READ BACK BY AND VERIFIED WITH: AMY TIPPETT RN   Glucose, capillary     Status: Abnormal   Collection Time: 10/24/2014  2:49 AM  Result Value Ref Range   Glucose-Capillary 153 (H) 65 - 99 mg/dL   Comment 1 Notify RN   CBC     Status: Abnormal   Collection Time: 10/17/2014  4:19 AM  Result Value Ref Range   WBC 20.2 (H) 3.6 - 11.0 K/uL   RBC 2.92 (L) 3.80 - 5.20 MIL/uL   Hemoglobin 8.5 (L) 12.0 - 16.0 g/dL   HCT 25.7 (L) 35.0 - 47.0 %   MCV 88.0  80.0 - 100.0 fL   MCH 29.0 26.0 - 34.0 pg   MCHC 32.9 32.0 - 36.0 g/dL   RDW 16.0 (H) 11.5 - 14.5 %   Platelets 174 150 - 440 K/uL  Basic metabolic panel     Status: Abnormal   Collection Time: 10/07/2014  4:19 AM  Result Value Ref Range   Sodium 134 (L) 135 - 145 mmol/L   Potassium 2.7 (LL) 3.5 - 5.1 mmol/L    Comment: CRITICAL RESULT CALLED TO, READ BACK BY AND VERIFIED WITH AMY TIPPETT 10/16/2014 0600 LKH   Chloride 103 101 - 111 mmol/L   CO2 19 (L) 22 - 32 mmol/L   Glucose, Bld 152 (H) 65 - 99 mg/dL   BUN 84 (H) 6 - 20 mg/dL   Creatinine, Ser 7.00 (H) 0.44 - 1.00 mg/dL   Calcium 7.7 (L) 8.9 - 10.3 mg/dL   GFR calc non Af Amer 6 (L) >  60 mL/min   GFR calc Af Amer 7 (L) >60 mL/min    Comment: (NOTE) The eGFR has been calculated using the CKD EPI equation. This calculation has not been validated in all clinical situations. eGFR's persistently <60 mL/min signify possible Chronic Kidney Disease.    Anion gap 12 5 - 15  Comprehensive metabolic panel     Status: Abnormal   Collection Time: 10/19/2014 12:33 PM  Result Value Ref Range   Sodium 133 (L) 135 - 145 mmol/L   Potassium 3.0 (L) 3.5 - 5.1 mmol/L   Chloride 103 101 - 111 mmol/L   CO2 16 (L) 22 - 32 mmol/L   Glucose, Bld 157 (H) 65 - 99 mg/dL   BUN 89 (H) 6 - 20 mg/dL   Creatinine, Ser 6.99 (H) 0.44 - 1.00 mg/dL   Calcium 7.6 (L) 8.9 - 10.3 mg/dL   Total Protein 5.2 (L) 6.5 - 8.1 g/dL   Albumin 1.9 (L) 3.5 - 5.0 g/dL   AST 25 15 - 41 U/L   ALT 8 (L) 14 - 54 U/L   Alkaline Phosphatase 78 38 - 126 U/L   Total Bilirubin 1.0 0.3 - 1.2 mg/dL   GFR calc non Af Amer 6 (L) >60 mL/min   GFR calc Af Amer 7 (L) >60 mL/min    Comment: (NOTE) The eGFR has been calculated using the CKD EPI equation. This calculation has not been validated in all clinical situations. eGFR's persistently <60 mL/min signify possible Chronic Kidney Disease.    Anion gap 14 5 - 15  Protime-INR     Status: Abnormal   Collection Time: 10/07/2014 12:33 PM   Result Value Ref Range   Prothrombin Time 66.0 (H) 11.4 - 15.0 seconds   INR 7.94 (HH)     Comment: CRITICAL RESULT CALLED TO, READ BACK BY AND VERIFIED WITH: LINDSEY TRIPP 09/27/2014 AT 1343 VKB   Troponin I     Status: Abnormal   Collection Time: 10/03/2014 12:33 PM  Result Value Ref Range   Troponin I 0.05 (H) <0.031 ng/mL    Comment: READ BACK AND VERIFIED WITH LINDSEY TRIPP AT 1336PM ON 10/10/2014 BY TB.        PERSISTENTLY INCREASED TROPONIN VALUES IN THE RANGE OF 0.04-0.49 ng/mL CAN BE SEEN IN:       -UNSTABLE ANGINA       -CONGESTIVE HEART FAILURE       -MYOCARDITIS       -CHEST TRAUMA       -ARRYHTHMIAS       -LATE PRESENTING MYOCARDIAL INFARCTION       -COPD   CLINICAL FOLLOW-UP RECOMMENDED.   C difficile quick scan w PCR reflex     Status: Abnormal   Collection Time: 10/15/2014  1:07 PM  Result Value Ref Range   C Diff antigen POSITIVE (A) NEGATIVE   C Diff toxin POSITIVE (A) NEGATIVE   C Diff interpretation      Positive for toxigenic C. difficile, active toxin production present.    Comment: Positive for toxigenic C. difficile, active toxin production present. LINDSEY TRIPP 10/07/2014 AT 1413 VKB   Type and screen     Status: None (Preliminary result)   Collection Time: 10/05/2014  3:15 PM  Result Value Ref Range   ABO/RH(D) PENDING    Antibody Screen PENDING    Sample Expiration 11/11/2014    No components found for: ESR, C REACTIVE PROTEIN MICRO: Recent Results (from the past 720 hour(s))  Blood culture (  routine x 2)     Status: None (Preliminary result)   Collection Time: 10/20/2014  5:54 PM  Result Value Ref Range Status   Specimen Description BLOOD RIGHT ARM  Final   Special Requests BOTTLES DRAWN AEROBIC AND ANAEROBIC  3CC  Final   Culture NO GROWTH < 24 HOURS  Final   Report Status PENDING  Incomplete  Blood culture (routine x 2)     Status: None (Preliminary result)   Collection Time: 10/16/2014  5:54 PM  Result Value Ref Range Status   Specimen  Description BLOOD PORTA CATH  Final   Special Requests BOTTLES DRAWN AEROBIC AND ANAEROBIC  2CC  Final   Culture NO GROWTH < 24 HOURS  Final   Report Status PENDING  Incomplete  MRSA PCR Screening     Status: Abnormal   Collection Time: 10/19/2014 12:17 AM  Result Value Ref Range Status   MRSA by PCR POSITIVE (A) NEGATIVE Final    Comment:        The GeneXpert MRSA Assay (FDA approved for NASAL specimens only), is one component of a comprehensive MRSA colonization surveillance program. It is not intended to diagnose MRSA infection nor to guide or monitor treatment for MRSA infections. CRITICAL RESULT CALLED TO, READ BACK BY AND VERIFIED WITH: AMY TIPPETT RN   C difficile quick scan w PCR reflex     Status: Abnormal   Collection Time: 10/07/2014  1:07 PM  Result Value Ref Range Status   C Diff antigen POSITIVE (A) NEGATIVE Final   C Diff toxin POSITIVE (A) NEGATIVE Final   C Diff interpretation   Final    Positive for toxigenic C. difficile, active toxin production present.    Comment: Positive for toxigenic C. difficile, active toxin production present. LINDSEY TRIPP 09/26/2014 AT 33 VKB     IMAGING: Dg Chest 1 View  10/18/2014   CLINICAL DATA:  RIGHT pleural effusion post thoracentesis, hypertension, stroke, CHF, breast cancer, former smoker, diabetes mellitus  EXAM: CHEST  1 VIEW  COMPARISON:  10/18/2014  FINDINGS: RIGHT jugular Port-A-Cath with tip projecting over SVC.  Upper normal size of cardiac silhouette.  Atherosclerotic calcification aorta.  Marked decrease in RIGHT pleural effusion post thoracentesis.  Near complete resolution of previously identified RIGHT basilar atelectasis.  No pulmonary infiltrate, pleural effusion or pneumothorax.  Bones demineralized.  IMPRESSION: Markedly improved aeration in lower RIGHT lung post thoracentesis.  No pneumothorax.   Electronically Signed   By: Lavonia Dana M.D.   On: 10/13/2014 15:00   Ct Abdomen Pelvis W Contrast  10/19/2014    CLINICAL DATA:  End-stage renal disease hypertension dense of heart failure, hypotension after dialysis, nausea and vomiting abdominal pain, elevated white blood count and 25,000  EXAM: CT ABDOMEN AND PELVIS WITH CONTRAST  TECHNIQUE: Multidetector CT imaging of the abdomen and pelvis was performed using the standard protocol following bolus administration of intravenous contrast.  CONTRAST:  168m OMNIPAQUE IOHEXOL 300 MG/ML  SOLN  COMPARISON:  07/12/2013  FINDINGS: Lower chest: Large right pleural effusion displaces heart and mediastinal contents towards the left. There is a small pericardial effusion. Significant atelectasis on the right.  Hepatobiliary: Gallbladder mildly distended. A few tiny calculi are seen within the gallbladder. There is choledocholithiasis with a 3 mm stone in the mid common bile duct common bile duct is 7 mm in diameter, upper normal. Choledocholithiasis was present on prior study. No intrahepatic biliary dilatation.  Pancreas: negative  Spleen: negative  Adrenals/Urinary Tract: Adrenal glands negative.  2 cm cyst midpole and 4 cm cyst lower pole right kidney. These are stable. 1 cm cyst midpole left kidney stable. A few tiny renal calcifications bilaterally are noted. No hydronephrosis. Bladder is normal.  Stomach/Bowel: Stomach and small bowel are normal. There is mild wall thickening of the rectum and sigmoid colon. There is severe wall thickening of the descending colon and distal half of the transverse colon. Proximal half of the transverse colon, ascending colon, and cecum show mild wall thickening. There is mild inflammatory change in the pericolic gutter surrounding the descending colon. Nonobstructive gas pattern.  Vascular/Lymphatic: Moderate atherosclerotic calcification of the aortoiliac vessels.  Reproductive: Uterus is enlarged with numerous masses, some of which show calcifications characteristic of fibroids. Other masses are noncalcified and mildly hyper attenuating.  Other:  Small volume of ascites in the abdomen primarily along the margin of the liver. Small volume of ascites in the pelvis.  Musculoskeletal: Mild increased attenuation throughout the subcutaneous soft tissues consistent with anasarca.  IMPRESSION: 1. Wall thickening throughout the large bowel consistent with inflammation particularly severe in the descending colon. In the setting of elevated white blood count this is concerning for colitis. 2. Large right pleural effusion exerting mass effect on mediastinal contents and causing significant acute atelectasis. 3. Chronic cholelithiasis and choledocholithiasis. No significant biliary dilatation. 4. Small volume of ascites.  Evidence of anasarca. 5. Uterine fibroids   Electronically Signed   By: Skipper Cliche M.D.   On: 10/19/2014 21:51   Dg Chest Port 1 View  10/03/2014   CLINICAL DATA:  Short of breath. Patient missed dialysis. Volume overload.  EXAM: PORTABLE CHEST - 1 VIEW  COMPARISON:  12/14/2014.  FINDINGS: There is a moderate RIGHT pleural effusion. This layers posteriorly and produces hazy opacity over the entire RIGHT chest. The cardiopericardial silhouette is enlarged. LEFT IJ power port is present. Aortic atherosclerosis. Collapse/ consolidation of the RIGHT lower lobe associated with pleural effusion. No LEFT pleural effusion.  When compared to 12/13/2013, the RIGHT IJ dialysis catheter has been removed.  IMPRESSION: Volume overload with cardiomegaly and moderate RIGHT pleural effusion.   Electronically Signed   By: Dereck Ligas M.D.   On: 10/05/2014 16:45   US Thoracentesis Asp Pleural Space W/img Guide  10/22/2014   CLINICAL DATA:  59 year old female with [facility]. Right pleural effusion. She has been referred for image guided thoracentesis  EXAM: ULTRASOUND GUIDED RIGHT THORACENTESIS  COMPARISON:  CT 10/15/2014  PROCEDURE: An ultrasound guided thoracentesis was thoroughly discussed with the patient and questions answered. The benefits,  risks, alternatives and complications were also discussed. The patient understands and wishes to proceed with the procedure. Written consent was obtained.  Ultrasound was performed to localize and mark an adequate pocket of fluid in the right chest. The area was then prepped and draped in the normal sterile fashion. 1% Lidocaine was used for local anesthesia. Under ultrasound guidance a Safe-T-Centesis catheter was introduced. Thoracentesis was performed. The catheter was removed and a dressing applied.  Complications:  COMPLICATIONS: None  FINDINGS: A total of approximately 1.2 L of amber thin fluid was removed. A fluid sample wassent for laboratory analysis.  IMPRESSION: Status post ultrasound-guided right-sided thoracentesis. 1.2 L of amber colored thin fluid removed.  Signed,  Dulcy Fanny. Earleen Newport, DO  [facility]  [facility]   Electronically Signed   By: Corrie Mckusick D.O.   On: 10/18/2014 15:20    Assessment:   Jessica Duran is a 59 y.o.  female with ESRD on HD admitted with hypotension,  lethargy, diarrhea, leukopocytosis and poor po intake. Had been on abx as otpt. Had large effusion tapped today. C diff is +.  Her CT abd showed colitis. I suspect this is all C diff, doubt she has pna. Bcx are pending. No evidence of AVF infection  Recommendations Would DC vanco, aztreonam and levo Start Oral vanco for severe C diff Await bcx and pleural fluid results  Thank you very much for allowing me to participate in the care of this patient. Please call with questions.   Cheral Marker. Ola Spurr, MD

## 2014-10-09 NOTE — Progress Notes (Deleted)
Pt to be discharged to [facility] for rehabilitation , his ride will be here at 1400. IV removed and report has been called to facility.

## 2014-10-09 NOTE — Procedures (Signed)
Interventional Radiology Procedure Note  Procedure: US guided thoracentesis. Right.  .  Complications: None Recommendations:  - Ok to shower tomorrow - Do not submerge for 7 days - Routine care - Follow up labs   Signed,  Dulcy Fanny. Earleen Newport, DO

## 2014-10-09 NOTE — Progress Notes (Signed)
Ultrasound and Dr. Margaretmary Eddy made aware of PT and INR. Cancelled thorancetesis and vitamin k and plasma ordered.

## 2014-10-09 NOTE — Progress Notes (Signed)
Clinical Education officer, museum (CSW) received the following contact numbers for patient from [employer].   Panacea Paone- Daughter [phone]  Juda Lajeunesse- Daughter [phone]  Caroleena Paolini- Daughter [phone]  [name]- Sister [phone]  Orma Flaming- boyfriend [phone]  Iris- (boyfriend's sister) [phone]  Hassan Rowan- sister [phone]   Blima Rich, [place] [phone]

## 2014-10-09 NOTE — Progress Notes (Signed)
Subjective:   Patient was sent from the dialysis unit yesterday for hypotension, lethargy, low hemoglobin and desaturation. She was not dialyzed Today, she is somewhat confused but able to answer questions She appears very weak She is having runny diarrhea  potassium level is low CT of the abdomen shows diffuse inflammation in the colon  Objective:  Vital signs in last 24 hours:  Temp:  [97.4 F (36.3 C)-98.6 F (37 C)] 97.4 F (36.3 C) (09/14 0453) Pulse Rate:  [76-84] 81 (09/14 0453) Resp:  [9-20] 20 (09/14 0453) BP: (91-118)/(41-67) 100/64 mmHg (09/14 0453) SpO2:  [94 %-100 %] 100 % (09/14 0453) Weight:  [53.706 kg (118 lb 6.4 oz)-61.689 kg (136 lb)] 53.706 kg (118 lb 6.4 oz) (09/13 2141)  Weight change:  Filed Weights   10/20/2014 1408 10/22/2014 2141  Weight: 61.689 kg (136 lb) 53.706 kg (118 lb 6.4 oz)    Intake/Output:   No intake or output data in the 24 hours ending 10/08/2014 1027   Physical Exam: General:  ill appearing, laying in the bed  HEENT  edentulous, moist membranes  Neck  supple  Pulm/lungs  mild diffuse rhonchi  CVS/Heart  irregular, no rub or gallop  Abdomen:   soft, mild diffuse tenderness  Extremities:  trace peripheral edema  Neurologic:  lethargic but arousable  Skin:  no acute rashes  Access:  AVG appears clotted       Basic Metabolic Panel:   Recent Labs Lab 09/28/2014 1609 10/25/2014 0419  NA 137 134*  K 3.0* 2.7*  CL 104 103  CO2 18* 19*  GLUCOSE 102* 152*  BUN 84* 84*  CREATININE 7.30* 7.00*  CALCIUM 8.4* 7.7*     CBC:  Recent Labs Lab 10/13/2014 1609 10/11/2014 0419  WBC 25.0* 20.2*  NEUTROABS 23.4*  --   HGB 9.3* 8.5*  HCT 28.5* 25.7*  MCV 88.7 88.0  PLT 184 174      Microbiology:  Recent Results (from the past 720 hour(s))  Blood culture (routine x 2)     Status: None (Preliminary result)   Collection Time: 10/11/2014  5:54 PM  Result Value Ref Range Status   Specimen Description BLOOD RIGHT ARM  Final   Special Requests BOTTLES DRAWN AEROBIC AND ANAEROBIC  3CC  Final   Culture NO GROWTH < 24 HOURS  Final   Report Status PENDING  Incomplete  Blood culture (routine x 2)     Status: None (Preliminary result)   Collection Time: 09/28/2014  5:54 PM  Result Value Ref Range Status   Specimen Description BLOOD PORTA CATH  Final   Special Requests BOTTLES DRAWN AEROBIC AND ANAEROBIC  2CC  Final   Culture NO GROWTH < 24 HOURS  Final   Report Status PENDING  Incomplete  MRSA PCR Screening     Status: Abnormal   Collection Time: 10/13/2014 12:17 AM  Result Value Ref Range Status   MRSA by PCR POSITIVE (A) NEGATIVE Final    Comment:        The GeneXpert MRSA Assay (FDA approved for NASAL specimens only), is one component of a comprehensive MRSA colonization surveillance program. It is not intended to diagnose MRSA infection nor to guide or monitor treatment for MRSA infections. CRITICAL RESULT CALLED TO, READ BACK BY AND VERIFIED WITH: AMY TIPPETT RN     Coagulation Studies: No results for input(s): LABPROT, INR in the last 72 hours.  Urinalysis: No results for input(s): COLORURINE, LABSPEC, [place], [place], [place], [place], [place], [place], [place],  NITRITE, LEUKOCYTESUR in the last 72 hours.  Invalid input(s): APPERANCEUR    Imaging: Ct Abdomen Pelvis W Contrast  10/21/2014   CLINICAL DATA:  End-stage renal disease hypertension dense of heart failure, hypotension after dialysis, nausea and vomiting abdominal pain, elevated white blood count and 25,000  EXAM: CT ABDOMEN AND PELVIS WITH CONTRAST  TECHNIQUE: Multidetector CT imaging of the abdomen and pelvis was performed using the standard protocol following bolus administration of intravenous contrast.  CONTRAST:  153mL OMNIPAQUE IOHEXOL 300 MG/ML  SOLN  COMPARISON:  07/12/2013  FINDINGS: Lower chest: Large right pleural effusion displaces heart and mediastinal contents towards the left. There is a small pericardial effusion.  Significant atelectasis on the right.  Hepatobiliary: Gallbladder mildly distended. A few tiny calculi are seen within the gallbladder. There is choledocholithiasis with a 3 mm stone in the mid common bile duct common bile duct is 7 mm in diameter, upper normal. Choledocholithiasis was present on prior study. No intrahepatic biliary dilatation.  Pancreas: negative  Spleen: negative  Adrenals/Urinary Tract: Adrenal glands negative. 2 cm cyst midpole and 4 cm cyst lower pole right kidney. These are stable. 1 cm cyst midpole left kidney stable. A few tiny renal calcifications bilaterally are noted. No hydronephrosis. Bladder is normal.  Stomach/Bowel: Stomach and small bowel are normal. There is mild wall thickening of the rectum and sigmoid colon. There is severe wall thickening of the descending colon and distal half of the transverse colon. Proximal half of the transverse colon, ascending colon, and cecum show mild wall thickening. There is mild inflammatory change in the pericolic gutter surrounding the descending colon. Nonobstructive gas pattern.  Vascular/Lymphatic: Moderate atherosclerotic calcification of the aortoiliac vessels.  Reproductive: Uterus is enlarged with numerous masses, some of which show calcifications characteristic of fibroids. Other masses are noncalcified and mildly hyper attenuating.  Other: Small volume of ascites in the abdomen primarily along the margin of the liver. Small volume of ascites in the pelvis.  Musculoskeletal: Mild increased attenuation throughout the subcutaneous soft tissues consistent with anasarca.  IMPRESSION: 1. Wall thickening throughout the large bowel consistent with inflammation particularly severe in the descending colon. In the setting of elevated white blood count this is concerning for colitis. 2. Large right pleural effusion exerting mass effect on mediastinal contents and causing significant acute atelectasis. 3. Chronic cholelithiasis and  choledocholithiasis. No significant biliary dilatation. 4. Small volume of ascites.  Evidence of anasarca. 5. Uterine fibroids   Electronically Signed   By: Skipper Cliche M.D.   On: 10/01/2014 21:51   Dg Chest Port 1 View  10/23/2014   CLINICAL DATA:  Short of breath. Patient missed dialysis. Volume overload.  EXAM: PORTABLE CHEST - 1 VIEW  COMPARISON:  12/14/2014.  FINDINGS: There is a moderate RIGHT pleural effusion. This layers posteriorly and produces hazy opacity over the entire RIGHT chest. The cardiopericardial silhouette is enlarged. LEFT IJ power port is present. Aortic atherosclerosis. Collapse/ consolidation of the RIGHT lower lobe associated with pleural effusion. No LEFT pleural effusion.  When compared to 12/13/2013, the RIGHT IJ dialysis catheter has been removed.  IMPRESSION: Volume overload with cardiomegaly and moderate RIGHT pleural effusion.   Electronically Signed   By: Dereck Ligas M.D.   On: 10/09/2014 16:45     Medications:   . sodium chloride 50 mL/hr at 10/15/2014 2012   . apixaban  2.5 mg Oral BID  . atorvastatin  20 mg Oral QHS  . aztreonam  500 mg Intravenous 3 times per day  .  carvedilol  12.5 mg Oral BID  . Chlorhexidine Gluconate Cloth  6 each Topical Q0600  . clopidogrel  75 mg Oral Daily  . multivitamin  1 tablet Oral Daily  . mupirocin ointment  1 application Nasal BID   acetaminophen **OR** acetaminophen, bisacodyl, bismuth subsalicylate, fluticasone, iohexol, meclizine, nitroGLYCERIN, ondansetron **OR** ondansetron (ZOFRAN) IV, promethazine, trolamine salicylate, vancomycin  Assessment/ Plan:  59 y.o. female With end-stage renal disease, history of stroke, hypertension, diastolic congestive heart failure, DVT, hyperparathyroidism, breast cancer status post radiation and chemotherapy  1. ESRD. [facility] dialysis. T-T-S - Patient has missed several treatments due to weakness and her illness - Her last dialysis was last Tuesday - At present,  her potassium is low, volume status appears to be that she is dehydrated. She is getting IV fluid supplementation - We'll hold off dialysis today until she is more stable 2. Diarrhea. We'll check for C. difficile colitis Colitis was seen by CT also 3. Anemia of chronic kidney disease - No EPO because of recent breast cancer 4. Secondary hyperparathyroidism - Monitor phosphorus during this admission 5. Patient's clinical condition has declined significantly over the last few months. Consider palliative care consultation 6. Hypokalemia - We will add potassium to IV fluids    LOS: 1 Daleen Steinhaus 9/14/201610:27 AM

## 2014-10-09 NOTE — Progress Notes (Signed)
Lab called. INR of 7.94 and pt.66.0

## 2014-10-09 NOTE — Progress Notes (Addendum)
[facility] [place] NAME: Margaree Sandhu    MR#:  [id number]  DATE OF BIRTH:  [birth year]  SUBJECTIVE:  CHIEF COMPLAINT:  Patient is altered and unable to get any history from her. According to the staff from [facility] patient is noncompliant with her hemodialysis and medications  REVIEW OF SYSTEMS:  Unobtainable as the patient is  with altered mentation  DRUG ALLERGIES:   Allergies  Allergen Reactions  . Acetaminophen Other (See Comments)    Reaction:  Unknown   . Hydralazine Other (See Comments)    Reaction:  Unknown   . Isosorbide Other (See Comments)    Reaction:  Unknown   . Levaquin [Levofloxacin In D5w] Other (See Comments)    Reaction:  Unknown   . Lisinopril Other (See Comments)    Reaction:  Caused pt to have a stroke.    Marland Kitchen Oxycodone Other (See Comments)    Reaction:  Unknown   . Penicillins Other (See Comments)    Reaction:  Unknown     VITALS:  Blood pressure 100/64, pulse 81, temperature 97.4 F (36.3 C), temperature source Oral, resp. rate 20, height 5\' 4"  (1.626 m), weight 53.706 kg (118 lb 6.4 oz), SpO2 100 %.  PHYSICAL EXAMINATION:  GENERAL:  59 y.o.-year-old patient lying in the bed with no acute distress.  EYES: Pupils equal, round, reactive to light and accommodation. No scleral icterus. HEENT: Head atraumatic, normocephalic. Oropharynx and nasopharynx clear.  NECK:  Supple, no jugular venous distention. No thyroid enlargement, no tenderness.  LUNGS: Coarse breath sounds on left side, minimal air movement on the right , no wheezing or crepitation. No use of accessory muscles of respiration.  CARDIOVASCULAR: S1, S2 normal. No murmurs, rubs, or gallops.  ABDOMEN: Soft, diffusely tender, nondistended. Bowel sounds present EXTREMITIES: No pedal edema, cyanosis, or clubbing.  NEUROLOGIC: Patient is with altered mental status.  PSYCHIATRIC: The patient is with altered mental status SKIN: No  obvious rash, lesion, or ulcer.    LABORATORY PANEL:   CBC  Recent Labs Lab 10/15/2014 0419  WBC 20.2*  HGB 8.5*  HCT 25.7*  PLT 174   ------------------------------------------------------------------------------------------------------------------  Chemistries   Recent Labs Lab 10/06/2014 0419  NA 134*  K 2.7*  CL 103  CO2 19*  GLUCOSE 152*  BUN 84*  CREATININE 7.00*  CALCIUM 7.7*   ------------------------------------------------------------------------------------------------------------------  Cardiac Enzymes  Recent Labs Lab 10/07/2014 1609  TROPONINI 0.36*   ------------------------------------------------------------------------------------------------------------------  RADIOLOGY:  Ct Abdomen Pelvis W Contrast  10/07/2014   CLINICAL DATA:  End-stage renal disease hypertension dense of heart failure, hypotension after dialysis, nausea and vomiting abdominal pain, elevated white blood count and 25,000  EXAM: CT ABDOMEN AND PELVIS WITH CONTRAST  TECHNIQUE: Multidetector CT imaging of the abdomen and pelvis was performed using the standard protocol following bolus administration of intravenous contrast.  CONTRAST:  134mL OMNIPAQUE IOHEXOL 300 MG/ML  SOLN  COMPARISON:  07/12/2013  FINDINGS: Lower chest: Large right pleural effusion displaces heart and mediastinal contents towards the left. There is a small pericardial effusion. Significant atelectasis on the right.  Hepatobiliary: Gallbladder mildly distended. A few tiny calculi are seen within the gallbladder. There is choledocholithiasis with a 3 mm stone in the mid common bile duct common bile duct is 7 mm in diameter, upper normal. Choledocholithiasis was present on prior study. No intrahepatic biliary dilatation.  Pancreas: negative  Spleen: negative  Adrenals/Urinary Tract: Adrenal glands negative. 2 cm cyst midpole and  4 cm cyst lower pole right kidney. These are stable. 1 cm cyst midpole left kidney stable. A few  tiny renal calcifications bilaterally are noted. No hydronephrosis. Bladder is normal.  Stomach/Bowel: Stomach and small bowel are normal. There is mild wall thickening of the rectum and sigmoid colon. There is severe wall thickening of the descending colon and distal half of the transverse colon. Proximal half of the transverse colon, ascending colon, and cecum show mild wall thickening. There is mild inflammatory change in the pericolic gutter surrounding the descending colon. Nonobstructive gas pattern.  Vascular/Lymphatic: Moderate atherosclerotic calcification of the aortoiliac vessels.  Reproductive: Uterus is enlarged with numerous masses, some of which show calcifications characteristic of fibroids. Other masses are noncalcified and mildly hyper attenuating.  Other: Small volume of ascites in the abdomen primarily along the margin of the liver. Small volume of ascites in the pelvis.  Musculoskeletal: Mild increased attenuation throughout the subcutaneous soft tissues consistent with anasarca.  IMPRESSION: 1. Wall thickening throughout the large bowel consistent with inflammation particularly severe in the descending colon. In the setting of elevated white blood count this is concerning for colitis. 2. Large right pleural effusion exerting mass effect on mediastinal contents and causing significant acute atelectasis. 3. Chronic cholelithiasis and choledocholithiasis. No significant biliary dilatation. 4. Small volume of ascites.  Evidence of anasarca. 5. Uterine fibroids   Electronically Signed   By: Skipper Cliche M.D.   On: 10/05/2014 21:51   Dg Chest Port 1 View  09/26/2014   CLINICAL DATA:  Short of breath. Patient missed dialysis. Volume overload.  EXAM: PORTABLE CHEST - 1 VIEW  COMPARISON:  12/14/2014.  FINDINGS: There is a moderate RIGHT pleural effusion. This layers posteriorly and produces hazy opacity over the entire RIGHT chest. The cardiopericardial silhouette is enlarged. LEFT IJ power port  is present. Aortic atherosclerosis. Collapse/ consolidation of the RIGHT lower lobe associated with pleural effusion. No LEFT pleural effusion.  When compared to 12/13/2013, the RIGHT IJ dialysis catheter has been removed.  IMPRESSION: Volume overload with cardiomegaly and moderate RIGHT pleural effusion.   Electronically Signed   By: Dereck Ligas M.D.   On: 10/16/2014 16:45    EKG:   Orders placed or performed during the hospital encounter of 10/07/2014  . ED EKG  . 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   CT of the abdomen and pelvis due to abdominal pain-revealed colitis, GI consult is placed Pain management as needed  2. Moderate to large right-sided pleural effusion may be underlying pneumonia Consulted interventional radiology regarding thoracentesis of the right-sided pleural effusion but unfortunately could not reach any family members for consent Continue broad-spectrum antibiotics as documented above  Diabetes 2 Sliding scale insulin hold hold glipizide  3. Hypertension due to hypotension will hold her blood pressure medications Monitor blood pressure closely  4. Chronic AC- coninue ELIQUIS   5. ESRD   appreciate nephorlogy consult for hd  6. Hypokalemia  low dose K+was given   nephrology is considering to add potassium to the IV fluids   7. Patient will remain full code until [place] is determined Consult placed and message was left to patient's daughter's and awaiting call back Palliative care consult is placed  to Dr. Megan Salon  8.coagulopathy INR 7.94 D/w dr.Schnier, rescheduled decloting for tomorrow Hold Eliquis Vit K today and FFP 2 units in am before declotting  All the  records are reviewed and case discussed with Care Management/Social Workerr. Management plans discussed with the patient, family and they are in agreement.   TOTAL TIME TAKING CARE OF THIS PATIENT: 8minutes.  Greater than 50% time was spent on coordination of care  POSSIBLE D/C IN ? DAYS, DEPENDING ON CLINICAL CONDITION.   Nicholes Mango M.D on 10/21/2014 at 12:08 PM  Between 7am to 6pm - Pager - [phone] After 6pm go to [email or web address] - password [email or web address] [facility]  Office  [phone]  CC: Primary care physician; CAMPBELL, Creola Corn, MD

## 2014-10-09 NOTE — Progress Notes (Signed)
Critical value - Potassium 2.7.  Notified Dr. Marcille Blanco.  MD said potassium will be fixed in dialysis.  Dialysis will be scheduled per Dr. Marcille Blanco.

## 2014-10-09 NOTE — Progress Notes (Signed)
Initial Nutrition Assessment   INTERVENTION:   Medical Food Supplement Therapy: recommend addition of nutritional supplement once diet advanced  NUTRITION DIAGNOSIS:   Inadequate oral intake related to acute illness as evidenced by NPO status.  GOAL:   Patient will meet greater than or equal to 90% of their needs  MONITOR:    (Energy Intake, Anthropometrics, Electrolyte/Renal Profile, Glucose Profile, Digestive System)  REASON FOR ASSESSMENT:   Diagnosis (ESRD)    ASSESSMENT:    Pt admitted with hypotension due to sepsis, CT with colitis, Cdiff postive ; down for procedure on visit today. Per nursing, pt confused  Past Medical History  Diagnosis Date  . History of stroke   . Hypertension   . Chronic kidney disease     stage III  . Diastolic congestive heart failure     ef 55-60%  . Fibromyalgia   . DVT (deep venous thrombosis)   . Hyperparathyroidism   . Stroke   . Diabetes mellitus without complication   . 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 Renal insufficiency     Diet Order:  Diet NPO time specified Diet NPO time specified  Food and Nutrition Related History: unable to assess  Skin:   (stage III coccyx)  Last BM:  diarrhea, Cdiff positive   Electrolyte and Renal Profile:  Recent Labs Lab 10/05/2014 1609 10/21/2014 0419 09/29/2014 1233  BUN 84* 84* 89*  CREATININE 7.30* 7.00* 6.99*  NA 137 134* 133*  K 3.0* 2.7* 3.0*   Glucose Profile:   Recent Labs  10/13/2014 0249  GLUCAP 153*   Protein Profile:   Recent Labs Lab 10/11/2014 1233  ALBUMIN 1.9*   Meds: D5-1/2 NS with KCl at 50 ml/hr, ss novolog, MVI  Nutrition Focused Physical Exam:  Unable to complete Nutrition-Focused physical exam at this time. Pt not in room  Height:   Ht Readings from Last 1 Encounters:  10/15/2014 5\' 4"  (1.626 m)    Weight: based on wt encounters, pt with 13.2% wt loss in 4 months  Wt Readings from Last 1 Encounters:  09/28/2014 118 lb 6.4 oz (53.706 kg)     Wt Readings from Last 10 Encounters:  09/27/2014 118 lb 6.4 oz (53.706 kg)  06/06/14 136 lb 9 oz (61.944 kg)    BMI:  Body mass index is 20.31 kg/(m^2).  Estimated Nutritional Needs:   Kcal:  1580-1842 kcals (BEE 1097, 1.2 AF, 1.2-1.4 IF) using IBW 54 kg  Protein:  81-108 g (1.5-2.0 g/kg)   Fluid:  1000 mL plus UOP  HIGH Care Level  Kerman Passey MS, RD, LDN [phone] Pager

## 2014-10-09 NOTE — Care Management Note (Signed)
Patients outpatient dialysis schedule is 2nd shift [facility]  [phone]

## 2014-10-10 ENCOUNTER — Encounter: Payer: Self-pay | Admitting: Gastroenterology

## 2014-10-10 DIAGNOSIS — G479 Sleep disorder, unspecified: Secondary | ICD-10-CM | POA: Insufficient documentation

## 2014-10-10 DIAGNOSIS — M25519 Pain in unspecified shoulder: Secondary | ICD-10-CM | POA: Insufficient documentation

## 2014-10-10 DIAGNOSIS — E1122 Type 2 diabetes mellitus with diabetic chronic kidney disease: Secondary | ICD-10-CM

## 2014-10-10 DIAGNOSIS — Z171 Estrogen receptor negative status [ER-]: Secondary | ICD-10-CM

## 2014-10-10 DIAGNOSIS — C50919 Malignant neoplasm of unspecified site of unspecified female breast: Secondary | ICD-10-CM | POA: Insufficient documentation

## 2014-10-10 DIAGNOSIS — I120 Hypertensive chronic kidney disease with stage 5 chronic kidney disease or end stage renal disease: Secondary | ICD-10-CM

## 2014-10-10 DIAGNOSIS — C50911 Malignant neoplasm of unspecified site of right female breast: Secondary | ICD-10-CM

## 2014-10-10 DIAGNOSIS — N186 End stage renal disease: Secondary | ICD-10-CM

## 2014-10-10 DIAGNOSIS — Z86718 Personal history of other venous thrombosis and embolism: Secondary | ICD-10-CM

## 2014-10-10 DIAGNOSIS — F1411 Cocaine abuse, in remission: Secondary | ICD-10-CM | POA: Insufficient documentation

## 2014-10-10 DIAGNOSIS — Z992 Dependence on renal dialysis: Secondary | ICD-10-CM

## 2014-10-10 DIAGNOSIS — E785 Hyperlipidemia, unspecified: Secondary | ICD-10-CM | POA: Insufficient documentation

## 2014-10-10 DIAGNOSIS — Z87820 Personal history of traumatic brain injury: Secondary | ICD-10-CM

## 2014-10-10 DIAGNOSIS — A047 Enterocolitis due to Clostridium difficile: Secondary | ICD-10-CM

## 2014-10-10 DIAGNOSIS — I10 Essential (primary) hypertension: Secondary | ICD-10-CM | POA: Insufficient documentation

## 2014-10-10 DIAGNOSIS — A419 Sepsis, unspecified organism: Secondary | ICD-10-CM

## 2014-10-10 DIAGNOSIS — R6521 Severe sepsis with septic shock: Secondary | ICD-10-CM

## 2014-10-10 DIAGNOSIS — Z515 Encounter for palliative care: Secondary | ICD-10-CM

## 2014-10-10 DIAGNOSIS — R9431 Abnormal electrocardiogram [ECG] [EKG]: Secondary | ICD-10-CM | POA: Insufficient documentation

## 2014-10-10 DIAGNOSIS — N189 Chronic kidney disease, unspecified: Secondary | ICD-10-CM | POA: Insufficient documentation

## 2014-10-10 DIAGNOSIS — Z8673 Personal history of transient ischemic attack (TIA), and cerebral infarction without residual deficits: Secondary | ICD-10-CM | POA: Insufficient documentation

## 2014-10-10 LAB — CBC
HEMATOCRIT: 28.4 % — AB (ref 35.0–47.0)
HEMOGLOBIN: 9.5 g/dL — AB (ref 12.0–16.0)
MCH: 29.1 pg (ref 26.0–34.0)
MCHC: 33.4 g/dL (ref 32.0–36.0)
MCV: 87 fL (ref 80.0–100.0)
Platelets: 184 10*3/uL (ref 150–440)
RBC: 3.26 MIL/uL — AB (ref 3.80–5.20)
RDW: 16.4 % — ABNORMAL HIGH (ref 11.5–14.5)
WBC: 15.3 10*3/uL — AB (ref 3.6–11.0)

## 2014-10-10 LAB — BASIC METABOLIC PANEL
ANION GAP: 12 (ref 5–15)
BUN: 92 mg/dL — ABNORMAL HIGH (ref 6–20)
CALCIUM: 7.7 mg/dL — AB (ref 8.9–10.3)
CHLORIDE: 105 mmol/L (ref 101–111)
CO2: 17 mmol/L — AB (ref 22–32)
Creatinine, Ser: 7.1 mg/dL — ABNORMAL HIGH (ref 0.44–1.00)
GFR calc non Af Amer: 6 mL/min — ABNORMAL LOW (ref >60)
GFR, EST AFRICAN AMERICAN: 7 mL/min — AB (ref >60)
Glucose, Bld: 115 mg/dL — ABNORMAL HIGH (ref 65–99)
Potassium: 2.8 mmol/L — CL (ref 3.5–5.1)
Sodium: 134 mmol/L — ABNORMAL LOW (ref 135–145)

## 2014-10-10 LAB — MAGNESIUM: MAGNESIUM: 1.9 mg/dL (ref 1.7–2.4)

## 2014-10-10 LAB — PH, BODY FLUID: PH, BODY FLUID: 7.6

## 2014-10-10 LAB — GLUCOSE, CAPILLARY
GLUCOSE-CAPILLARY: 160 mg/dL — AB (ref 65–99)
GLUCOSE-CAPILLARY: 146 mg/dL — AB (ref 65–99)
Glucose-Capillary: 114 mg/dL — ABNORMAL HIGH (ref 65–99)
Glucose-Capillary: 126 mg/dL — ABNORMAL HIGH (ref 65–99)
Glucose-Capillary: 160 mg/dL — ABNORMAL HIGH (ref 65–99)
Glucose-Capillary: 169 mg/dL — ABNORMAL HIGH (ref 65–99)

## 2014-10-10 LAB — PROTIME-INR
INR: 3.13
Prothrombin Time: 32.2 seconds — ABNORMAL HIGH (ref 11.4–15.0)

## 2014-10-10 LAB — POTASSIUM
Potassium: 2.7 mmol/L — CL (ref 3.5–5.1)
Potassium: 3.4 mmol/L — ABNORMAL LOW (ref 3.5–5.1)

## 2014-10-10 LAB — PHOSPHORUS: Phosphorus: 5.5 mg/dL — ABNORMAL HIGH (ref 2.5–4.6)

## 2014-10-10 LAB — MISC LABCORP TEST (SEND OUT)
LABCORP TEST CODE: 88062
LABCORP TEST NAME: 88062

## 2014-10-10 MED ORDER — CETYLPYRIDINIUM CHLORIDE 0.05 % MT LIQD
7.0000 mL | Freq: Two times a day (BID) | OROMUCOSAL | Status: DC
  Administered 2014-10-10 – 2014-10-11 (×2): 7 mL via OROMUCOSAL

## 2014-10-10 MED ORDER — POTASSIUM CHLORIDE CRYS ER 20 MEQ PO TBCR
20.0000 meq | EXTENDED_RELEASE_TABLET | Freq: Once | ORAL | Status: AC
  Administered 2014-10-10: 20 meq via ORAL
  Filled 2014-10-10: qty 1

## 2014-10-10 MED ORDER — SODIUM CHLORIDE 0.9 % IV BOLUS (SEPSIS): 500.0000 mL | Freq: Once | INTRAVENOUS | Status: DC

## 2014-10-10 MED ORDER — DIPHENHYDRAMINE HCL 25 MG PO CAPS
25.0000 mg | ORAL_CAPSULE | Freq: Every evening | ORAL | Status: DC | PRN
  Administered 2014-10-10: 01:00:00 25 mg via ORAL
  Filled 2014-10-10: qty 1

## 2014-10-10 MED ORDER — PNEUMOCOCCAL VAC POLYVALENT 25 MCG/0.5ML IJ INJ
0.5000 mL | INJECTION | INTRAMUSCULAR | Status: AC
  Administered 2014-10-11: 0.5 mL via INTRAMUSCULAR
  Filled 2014-10-10: qty 0.5

## 2014-10-10 MED ORDER — NOREPINEPHRINE BITARTRATE 1 MG/ML IV SOLN
0.0000 ug/min | INTRAVENOUS | Status: DC
  Administered 2014-10-10: 2 ug/min via INTRAVENOUS
  Filled 2014-10-10: qty 4

## 2014-10-10 MED ORDER — POTASSIUM CHLORIDE CRYS ER 20 MEQ PO TBCR
20.0000 meq | EXTENDED_RELEASE_TABLET | Freq: Once | ORAL | Status: AC
  Administered 2014-10-10: 07:00:00 20 meq via ORAL
  Filled 2014-10-10: qty 1

## 2014-10-10 MED ORDER — METRONIDAZOLE IN NACL 5-0.79 MG/ML-% IV SOLN
500.0000 mg | Freq: Three times a day (TID) | INTRAVENOUS | Status: DC
  Administered 2014-10-10 – 2014-10-12 (×7): 500 mg via INTRAVENOUS
  Filled 2014-10-10 (×10): qty 100

## 2014-10-10 MED ORDER — SODIUM CHLORIDE 0.9 % IV BOLUS (SEPSIS)
500.0000 mL | Freq: Once | INTRAVENOUS | Status: AC
  Administered 2014-10-10: 500 mL via INTRAVENOUS

## 2014-10-10 NOTE — Progress Notes (Signed)
RN address with Dr Candiss Norse that there is a serum phos ordered since patient has been having ectopy, RN wants to know if MD wants serum potassium to be added as patient previous potassium was low.  MD states "she's currently getting replacement but you can add a serum potassium."

## 2014-10-10 NOTE — Care Management Note (Signed)
Case Management Note  Patient Details  Name: LEZLI DANEK MRN: [id number] Date of Birth: 03/03/1955  Subjective/Objective:    Reviewed previous CM and CSW noted. Transfer from 1C will follow                Action/Plan:   Expected Discharge Date:                  Expected Discharge Plan:     In-House Referral:     Discharge planning Services     Post Acute Care Choice:    Choice offered to:     DME Arranged:    DME Agency:     HH Arranged:    [place] Agency:     Status of Service:     Medicare Important Message Given:  Yes-second notification given Date Medicare IM Given:    Medicare IM give by:    Date Additional Medicare IM Given:    Additional Medicare Important Message give by:     If discussed at [place] of Stay Meetings, dates discussed:    Additional Comments:  Jolly Mango, RN 10/10/2014, 3:02 PM

## 2014-10-10 NOTE — Progress Notes (Signed)
Dr Megan Salon at bedside, notified nurse that patient is also DNI and no feeding tube.  MD request for RN to put in order for magnesium serum tomorrow am due to patient having ectopy.

## 2014-10-10 NOTE — Consult Note (Signed)
GI Inpatient Consult Note  Reason for Consult: Acute Colitis   Attending Requesting Consult: Nicholes Mango, MD  History of Present Illness:  Jessica Duran is a 59 y.o. female presented to [facility] ER department on 10/22/2014 from dialysis for evaluation of nausea, vomiting, abdominal pain and hypotension. She was found to have a WBC count of 25,000.Hgb 9.3/hct 28.5 and platelet count of 184,000.  Chest x-ray positive for volume overload with cardiomegaly and moderate right pleural effusion with subsequent thoracentesis. A CT of Abd & Pelvis revealed wall thickening throughout the large bowel consistent with inflammation particularly severe in the descending colon. Colitis suspected given elevated WBC. C difficile quick scan positive on 11/08/14. Started on oral Vancomycin.   Unfortunately, her condition has worsening and she has been transferred to intensive care receiving Levophed. Flagyl IV ordered.   Her significant other, Debbora Dus reports patient was taking an antibiotic for pneumonia per Dr. Tamala Julian at [facility] and also was having diarrhea. He doesn't know how long she had been taking the antibiotic or had diarrhea.    Past Medical History:  Past Medical History  Diagnosis Date  . History of stroke   . Hypertension   . Chronic kidney disease     stage III  . Diastolic congestive heart failure     ef 55-60%  . Fibromyalgia   . DVT (deep venous thrombosis)   . Hyperparathyroidism   . Stroke   . Diabetes mellitus without complication   . 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 Renal insufficiency     Problem List: Patient Active Problem List   Diagnosis Date Noted  . Abnormal ECG 10/10/2014  . Chronic kidney disease 10/10/2014  . Pain in shoulder 10/10/2014  . H/O cocaine abuse 10/10/2014  . Cerebrovascular accident, old 10/10/2014  . HLD (hyperlipidemia) 10/10/2014  . BP (high blood pressure) 10/10/2014  . Breast cancer, female 10/10/2014  . Disordered sleep  10/10/2014  . Pressure ulcer 10/19/2014  . Hypotension 09/29/2014  . Cerebellar infarct 05/27/2014  . ESRD (end stage renal disease) 05/27/2014  . DM (diabetes mellitus) 05/27/2014  . HTN (hypertension) 05/27/2014    Past Surgical History: Past Surgical History  Procedure Laterality Date  . Appendectomy    . Cesarean section      x3  . Breast lumpectomy Right may 2015  . Breast biopsy Right     03/27/2013 positive  . Status post radiation Left     breast cancer  . Status post chemo Left     breast cancer    Allergies: Allergies  Allergen Reactions  . Acetaminophen Other (See Comments)    Other reaction(s): Unknown Reaction:  Unknown   . Hydralazine Other (See Comments)    Reaction:  Unknown   . Isosorbide Other (See Comments)    Reaction:  Unknown   . Levaquin [Levofloxacin In D5w] Other (See Comments)    Reaction:  Unknown   . Lisinopril Other (See Comments)    Reaction:  Caused pt to have a stroke.    Marland Kitchen Oxycodone Other (See Comments)    Reaction:  Unknown   . Oxycodone-Acetaminophen     Other reaction(s): Unknown Other reaction(s): Unknown  . Penicillins Other (See Comments)    Reaction:  Unknown     Home Medications: Prescriptions prior to admission  Medication Sig Dispense Refill Last Dose  . amLODipine (NORVASC) 10 MG tablet Take 10 mg by mouth daily.   unknown at unknown  . apixaban (  ELIQUIS) 2.5 MG TABS tablet Take 2.5 mg by mouth 2 (two) times daily.   unknown at unknown  . atorvastatin (LIPITOR) 20 MG tablet Take 20 mg by mouth at bedtime.    unknown at unknown  . azithromycin (ZITHROMAX) 500 MG tablet Take 500 mg by mouth at bedtime.   unknown at unknown  . b complex-vitamin c-folic acid (NEPHRO-VITE) 0.8 MG TABS tablet Take 1 tablet by mouth daily.    unknown at unknown  . bisacodyl (DULCOLAX) 5 MG EC tablet Take 10 mg by mouth daily as needed for moderate constipation.    PRN at PRN  . bismuth subsalicylate (PEPTO BISMOL) 262 MG/15ML suspension Take 30  mLs by mouth every 4 (four) hours as needed for indigestion.   PRN at PRN  . carvedilol (COREG) 12.5 MG tablet Take 12.5 mg by mouth 2 (two) times daily.   unknown at unknown  . cloNIDine (CATAPRES) 0.2 MG tablet Take 0.2 mg by mouth 2 (two) times daily.   unknown at unknown  . clopidogrel (PLAVIX) 75 MG tablet Take 75 mg by mouth daily.   unknown at unknown  . fluticasone (FLONASE) 50 MCG/ACT nasal spray Place 2 sprays into both nostrils daily as needed for allergies.    PRN at PRN  . glipiZIDE (GLUCOTROL XL) 2.5 MG 24 hr tablet Take 2.5 mg by mouth daily.   unknown at unknown  . guaifenesin (ROBITUSSIN) 100 MG/5ML syrup Take 200 mg by mouth every 6 (six) hours as needed for cough.   PRN at PRN  . ibuprofen (ADVIL,MOTRIN) 200 MG tablet Take 400 mg by mouth every 4 (four) hours as needed for fever.   PRN at PRN  . lidocaine-prilocaine (EMLA) cream Apply 1 application topically as needed (to fistula).    PRN at PRN  . meclizine (ANTIVERT) 25 MG tablet Take 25 mg by mouth every 12 (twelve) hours as needed for dizziness.   PRN at PRN  . Melatonin 3 MG TABS Take 3 mg by mouth at bedtime as needed (for sleep).    PRN at PRN  . multivitamin (RENA-VIT) TABS tablet Take 1 tablet by mouth daily.   unknown at unknown  . nitroGLYCERIN (NITROSTAT) 0.4 MG SL tablet Place 0.4 mg under the tongue every 5 (five) minutes as needed for chest pain.   PRN at PRN  . nystatin cream (MYCOSTATIN) Apply 1 application topically every 8 (eight) hours as needed (for rash).   PRN at PRN  . ondansetron (ZOFRAN) 4 MG tablet Take 4 mg by mouth every 6 (six) hours as needed for nausea or vomiting.   PRN at PRN  . polyethylene glycol (MIRALAX / GLYCOLAX) packet Take 17 g by mouth daily as needed for mild constipation.   PRN at PRN  . promethazine (PHENERGAN) 25 MG tablet Take 25 mg by mouth every 6 (six) hours as needed for nausea or vomiting.   PRN at PRN  . promethazine (PHENERGAN) 25 MG/ML injection Inject 25 mg into the muscle  every 6 (six) hours as needed for nausea or vomiting.   PRN at PRN  . senna-docusate (SENOKOT-S) 8.6-50 MG per tablet Take 2 tablets by mouth every 12 (twelve) hours.   unknown at unknown  . traMADol (ULTRAM) 50 MG tablet Take 50 mg by mouth every 12 (twelve) hours as needed for moderate pain.    PRN at PRN  . trolamine salicylate (ASPERCREME) 10 % cream Apply 1 application topically as needed for muscle pain.  PRN at PRN  . sulfamethoxazole-trimethoprim (BACTRIM DS,SEPTRA DS) 800-160 MG per tablet Take 1 tablet by mouth 2 (two) times daily. (Patient not taking: Reported on 10/14/2014) 20 tablet no Taking   Home medication reconciliation was completed with the patient.   Scheduled Inpatient Medications:   . atorvastatin  20 mg Oral QHS  . Chlorhexidine Gluconate Cloth  6 each Topical Q0600  . clopidogrel  75 mg Oral Daily  . insulin aspart  0-24 Units Subcutaneous 6 times per day  . metronidazole  500 mg Intravenous Q8H  . multivitamin  1 tablet Oral Daily  . mupirocin ointment  1 application Nasal BID  . vancomycin  250 mg Oral 4 times per day    Continuous Inpatient Infusions:   . sodium chloride    . dextrose 5 % and 0.45 % NaCl with KCl 20 mEq/L Stopped (10/10/14 1138)  . norepinephrine (LEVOPHED) Adult infusion 2 mcg/min (10/10/14 1347)    PRN Inpatient Medications:  acetaminophen **OR** acetaminophen, bisacodyl, bismuth subsalicylate, diphenhydrAMINE, fluticasone, iohexol, meclizine, nitroGLYCERIN, ondansetron **OR** ondansetron (ZOFRAN) IV, promethazine, trolamine salicylate  Family History: family history includes Cancer in her mother; Hypertension in her father.  The patient's family history is negative for inflammatory bowel disorders, GI malignancy, or solid organ transplantation.  Social History:   reports that she quit smoking about 20 months ago. Her smoking use included Cigarettes. She does not have any smokeless tobacco history on file. She reports that she uses  illicit drugs (Cocaine). She reports that she does not drink alcohol.   Review of Systems: GI: see HPI that was obtained from review of chart and pt's significant other Hosea Bradsher.  Physical Examination: BP 95/56 mmHg  Pulse 69  Temp(Src) 93.2 F (34 C) (Rectal)  Resp 19  Ht 5\' 4"  (1.626 m)  Wt 53.706 kg (118 lb 6.4 oz)  BMI 20.31 kg/m2  SpO2 100% Gen: Awake. Moaning. Not responsive to questions.  HEENT: sclera anicteric.  Neck: supple, no JVD or thyromegaly Chest: CTA bilaterally, no wheezes, crackles, or other adventitious sounds CV: RRR, no m/g/c/r Abd: soft, ND, +BS in all four quadrants; no HSM, guarding, ridigity, or rebound tenderness. Unable to assess tenderness on exam. Moaning throughout entire exam.  Ext: no edema, well perfused with 2+ pulses, Skin: warming blanket on. Cool to touch.   Data: Lab Results  Component Value Date   WBC 15.3* 10/10/2014   HGB 9.5* 10/10/2014   HCT 28.4* 10/10/2014   MCV 87.0 10/10/2014   PLT 184 10/10/2014    Recent Labs Lab 10/04/2014 1609 10/05/2014 0419 10/10/14 0513  HGB 9.3* 8.5* 9.5*   Lab Results  Component Value Date   NA 134* 10/10/2014   K 2.8* 10/10/2014   CL 105 10/10/2014   CO2 17* 10/10/2014   BUN 92* 10/10/2014   CREATININE 7.10* 10/10/2014   Lab Results  Component Value Date   ALT 8* 10/04/2014   AST 25 10/21/2014   ALKPHOS 78 10/10/2014   BILITOT 1.0 10/17/2014    Recent Labs Lab 10/10/14 0513  INR 3.13   Quick scan C difficle- positive CT scan reviewed.  Wall thickening throughout the large bowel consistent with inflammation particularly severe in the descending colon. In the setting of elevated white blood count this is concerning for Colitis.  Assessment/Plan: Ms. Saccente is a 59 y.o. female with C-difficile Colitis in intensive with Levophed infusion.  Recommendations:  Agree with IV Flagyl and supportive care.   Thank you for the consult. Please  call with questions or  concerns. Patient seen with Dr. Verdie Shire under collaborative agreement.  Faye Ramsay, FNP

## 2014-10-10 NOTE — Progress Notes (Signed)
ID E - Note Afebrile, WBC decreasing. Jessica Duran neg and pleural fluid cx neg  I suspect her acute illness is due to C diff Continue to hold systemic antibitoics Continue oral vanco for C diff- 14 day course

## 2014-10-10 NOTE — Consult Note (Signed)
  Pt seen and examined. Please see D. Martin's notes. Pt with septic shock. Incoherent. Positive c.diff. Pleural effusion but cx/gram stain neg. CT shows significant colitis. Remains hypotensive despite levophed. Currently, SBP less than 80. On po vanco. Flagyl IV started. Doubt pt can take any po right now. Pt critically ill. Continue pressors/IV hydration. Will see if condition improves next 24 hrs though it is doubtful at this point. Will follow. Thanks

## 2014-10-10 NOTE — Care Management Important Message (Signed)
Important Message  Patient Details  Name: Jessica Duran MRN: [id number] Date of Birth: 1955-09-21   Medicare Important Message Given:  Yes-second notification given    Shelbie Ammons, RN 10/10/2014, 12:15 PM

## 2014-10-10 NOTE — Progress Notes (Signed)
RN received critical potassium notification from lab but no result in computer.  RN contact lab tech who then discovered that the critical result may not haven been taken from from the most recent sample.  Lab tech will check to confirm.  RN continue to monitor.

## 2014-10-10 NOTE — Care Management (Signed)
Admitted to [facility] from [place] with the diagnosis of hypotension. Established dialysis patient. Significant person is Tax inspector [phone]).  NPO. WBC's 15.3 today. Receiving Levaquin and Vancomycin IV. GI, Vascular, and nephrology consults.  Enteric contact isolations continue. Shelbie Ammons RN MSN Care Management [phone]

## 2014-10-10 NOTE — Plan of Care (Signed)
Problem: Discharge Progression Outcomes Goal: Other Discharge Outcomes/Goals Outcome: Progressing Pt is alert to self and location, confused and impulsive. C/o pain all over, tylenol given with relief. Pt moans all evening, benadryl given for sleep with no effect. MD notified that patient still has a UA due, Dr. Benjie Karvonen states to check in the morning if they still want a sample, patient is incontinent. Few lose formed stools. NPO except sips with meds.

## 2014-10-10 NOTE — Progress Notes (Signed)
Attempted to tranfuse plasma to patient - pre vitals: low bp and low temp. Dr. Posey Pronto and Dr. Lucky Cowboy made aware. Plasma order cancelled. Patient transferred to CCU per Dr. Posey Pronto. Report given to Tram. Madlyn Frankel, RN

## 2014-10-10 NOTE — Progress Notes (Signed)
Subjective:   Patient was sent from the dialysis unit for hypotension, lethargy, low hemoglobin and desaturation. Today, she remains somewhat confused but able to answer few questions. Appears restless She is very weak She is having runny diarrhea . C Diff is positive potassium level is low CT of the abdomen shows diffuse inflammation in the colon  Objective:  Vital signs in last 24 hours:  Temp:  [97.4 F (36.3 C)-97.5 F (36.4 C)] 97.4 F (36.3 C) (09/15 0427) Pulse Rate:  [80-87] 80 (09/15 0427) Resp:  [12-16] 16 (09/15 0427) BP: (84-98)/(52-71) 94/68 mmHg (09/15 0427) SpO2:  [95 %-100 %] 100 % (09/15 0427)  Weight change:  Filed Weights   10/02/2014 1408 10/18/2014 2141  Weight: 61.689 kg (136 lb) 53.706 kg (118 lb 6.4 oz)    Intake/Output:    Intake/Output Summary (Last 24 hours) at 10/10/14 1135 Last data filed at 10/10/14 0705  Gross per 24 hour  Intake      0 ml  Output      0 ml  Net      0 ml     Physical Exam: General:  ill appearing, laying in the bed  HEENT  edentulous, dry oral membranes  Neck  supple  Pulm/lungs  mild diffuse rhonchi  CVS/Heart  irregular, no rub or gallop  Abdomen:   soft, mild diffuse tenderness  Extremities:  trace peripheral edema  Neurologic:  lethargic but arousable  Skin:  no acute rashes  Access:  AVG appears clotted       Basic Metabolic Panel:   Recent Labs Lab 10/04/2014 1609 10/01/2014 0419 10/11/2014 1233 10/10/14 0513  NA 137 134* 133* 134*  K 3.0* 2.7* 3.0* 2.8*  CL 104 103 103 105  CO2 18* 19* 16* 17*  GLUCOSE 102* 152* 157* 115*  BUN 84* 84* 89* 92*  CREATININE 7.30* 7.00* 6.99* 7.10*  CALCIUM 8.4* 7.7* 7.6* 7.7*     CBC:  Recent Labs Lab 09/28/2014 1609 10/11/2014 0419 10/10/14 0513  WBC 25.0* 20.2* 15.3*  NEUTROABS 23.4*  --   --   HGB 9.3* 8.5* 9.5*  HCT 28.5* 25.7* 28.4*  MCV 88.7 88.0 87.0  PLT 184 174 184      Microbiology:  Recent Results (from the past 720 hour(s))  Blood culture  (routine x 2)     Status: None (Preliminary result)   Collection Time: 10/20/2014  5:54 PM  Result Value Ref Range Status   Specimen Description BLOOD RIGHT ARM  Final   Special Requests BOTTLES DRAWN AEROBIC AND ANAEROBIC  3CC  Final   Culture NO GROWTH < 24 HOURS  Final   Report Status PENDING  Incomplete  Blood culture (routine x 2)     Status: None (Preliminary result)   Collection Time: 10/25/2014  5:54 PM  Result Value Ref Range Status   Specimen Description BLOOD PORTA CATH  Final   Special Requests BOTTLES DRAWN AEROBIC AND ANAEROBIC  2CC  Final   Culture NO GROWTH < 24 HOURS  Final   Report Status PENDING  Incomplete  MRSA PCR Screening     Status: Abnormal   Collection Time: 10/18/2014 12:17 AM  Result Value Ref Range Status   MRSA by PCR POSITIVE (A) NEGATIVE Final    Comment:        The GeneXpert MRSA Assay (FDA approved for NASAL specimens only), is one component of a comprehensive MRSA colonization surveillance program. It is not intended to diagnose MRSA infection nor to  guide or monitor treatment for MRSA infections. CRITICAL RESULT CALLED TO, READ BACK BY AND VERIFIED WITH: AMY TIPPETT RN   C difficile quick scan w PCR reflex     Status: Abnormal   Collection Time: 10/11/2014  1:07 PM  Result Value Ref Range Status   C Diff antigen POSITIVE (A) NEGATIVE Final   C Diff toxin POSITIVE (A) NEGATIVE Final   C Diff interpretation   Final    Positive for toxigenic C. difficile, active toxin production present.    Comment: Positive for toxigenic C. difficile, active toxin production present. LINDSEY TRIPP 10/08/2014 AT 1413 VKB   Body fluid culture     Status: None (Preliminary result)   Collection Time: 09/26/2014  2:10 PM  Result Value Ref Range Status   Specimen Description PLEURAL  Final   Special Requests NONE  Final   Gram Stain PENDING  Incomplete   Culture NO GROWTH < 24 HOURS  Final   Report Status PENDING  Incomplete    Coagulation Studies:  Recent Labs   10/16/2014 1233 10/10/14 0513  LABPROT 66.0* 32.2*  INR 7.94* 3.13    Urinalysis: No results for input(s): COLORURINE, LABSPEC, PHURINE, GLUCOSEU, HGBUR, BILIRUBINUR, KETONESUR, PROTEINUR, UROBILINOGEN, NITRITE, LEUKOCYTESUR in the last 72 hours.  Invalid input(s): APPERANCEUR    Imaging: Dg Chest 1 View  10/22/2014   CLINICAL DATA:  RIGHT pleural effusion post thoracentesis, hypertension, stroke, CHF, breast cancer, former smoker, diabetes mellitus  EXAM: CHEST  1 VIEW  COMPARISON:  10/18/2014  FINDINGS: RIGHT jugular Port-A-Cath with tip projecting over SVC.  Upper normal size of cardiac silhouette.  Atherosclerotic calcification aorta.  Marked decrease in RIGHT pleural effusion post thoracentesis.  Near complete resolution of previously identified RIGHT basilar atelectasis.  No pulmonary infiltrate, pleural effusion or pneumothorax.  Bones demineralized.  IMPRESSION: Markedly improved aeration in lower RIGHT lung post thoracentesis.  No pneumothorax.   Electronically Signed   By: Lavonia Dana M.D.   On: 10/02/2014 15:00   Ct Abdomen Pelvis W Contrast  09/29/2014   CLINICAL DATA:  End-stage renal disease hypertension dense of heart failure, hypotension after dialysis, nausea and vomiting abdominal pain, elevated white blood count and 25,000  EXAM: CT ABDOMEN AND PELVIS WITH CONTRAST  TECHNIQUE: Multidetector CT imaging of the abdomen and pelvis was performed using the standard protocol following bolus administration of intravenous contrast.  CONTRAST:  145mL OMNIPAQUE IOHEXOL 300 MG/ML  SOLN  COMPARISON:  07/12/2013  FINDINGS: Lower chest: Large right pleural effusion displaces heart and mediastinal contents towards the left. There is a small pericardial effusion. Significant atelectasis on the right.  Hepatobiliary: Gallbladder mildly distended. A few tiny calculi are seen within the gallbladder. There is choledocholithiasis with a 3 mm stone in the mid common bile duct common bile duct is 7 mm in  diameter, upper normal. Choledocholithiasis was present on prior study. No intrahepatic biliary dilatation.  Pancreas: negative  Spleen: negative  Adrenals/Urinary Tract: Adrenal glands negative. 2 cm cyst midpole and 4 cm cyst lower pole right kidney. These are stable. 1 cm cyst midpole left kidney stable. A few tiny renal calcifications bilaterally are noted. No hydronephrosis. Bladder is normal.  Stomach/Bowel: Stomach and small bowel are normal. There is mild wall thickening of the rectum and sigmoid colon. There is severe wall thickening of the descending colon and distal half of the transverse colon. Proximal half of the transverse colon, ascending colon, and cecum show mild wall thickening. There is mild inflammatory change in the  pericolic gutter surrounding the descending colon. Nonobstructive gas pattern.  Vascular/Lymphatic: Moderate atherosclerotic calcification of the aortoiliac vessels.  Reproductive: Uterus is enlarged with numerous masses, some of which show calcifications characteristic of fibroids. Other masses are noncalcified and mildly hyper attenuating.  Other: Small volume of ascites in the abdomen primarily along the margin of the liver. Small volume of ascites in the pelvis.  Musculoskeletal: Mild increased attenuation throughout the subcutaneous soft tissues consistent with anasarca.  IMPRESSION: 1. Wall thickening throughout the large bowel consistent with inflammation particularly severe in the descending colon. In the setting of elevated white blood count this is concerning for colitis. 2. Large right pleural effusion exerting mass effect on mediastinal contents and causing significant acute atelectasis. 3. Chronic cholelithiasis and choledocholithiasis. No significant biliary dilatation. 4. Small volume of ascites.  Evidence of anasarca. 5. Uterine fibroids   Electronically Signed   By: Skipper Cliche M.D.   On: 10/07/2014 21:51   Dg Chest Port 1 View  10/06/2014   CLINICAL DATA:   Short of breath. Patient missed dialysis. Volume overload.  EXAM: PORTABLE CHEST - 1 VIEW  COMPARISON:  12/14/2014.  FINDINGS: There is a moderate RIGHT pleural effusion. This layers posteriorly and produces hazy opacity over the entire RIGHT chest. The cardiopericardial silhouette is enlarged. LEFT IJ power port is present. Aortic atherosclerosis. Collapse/ consolidation of the RIGHT lower lobe associated with pleural effusion. No LEFT pleural effusion.  When compared to 12/13/2013, the RIGHT IJ dialysis catheter has been removed.  IMPRESSION: Volume overload with cardiomegaly and moderate RIGHT pleural effusion.   Electronically Signed   By: Dereck Ligas M.D.   On: 10/04/2014 16:45   US Thoracentesis Asp Pleural Space W/img Guide  10/25/2014   CLINICAL DATA:  59 year old female with [facility]. Right pleural effusion. She has been referred for image guided thoracentesis  EXAM: ULTRASOUND GUIDED RIGHT THORACENTESIS  COMPARISON:  CT 10/20/2014  PROCEDURE: An ultrasound guided thoracentesis was thoroughly discussed with the patient and questions answered. The benefits, risks, alternatives and complications were also discussed. The patient understands and wishes to proceed with the procedure. Written consent was obtained.  Ultrasound was performed to localize and mark an adequate pocket of fluid in the right chest. The area was then prepped and draped in the normal sterile fashion. 1% Lidocaine was used for local anesthesia. Under ultrasound guidance a Safe-T-Centesis catheter was introduced. Thoracentesis was performed. The catheter was removed and a dressing applied.  Complications:  COMPLICATIONS: None  FINDINGS: A total of approximately 1.2 L of amber thin fluid was removed. A fluid sample wassent for laboratory analysis.  IMPRESSION: Status post ultrasound-guided right-sided thoracentesis. 1.2 L of amber colored thin fluid removed.  Signed,  Dulcy Fanny. Earleen Newport, DO  [facility]  [facility]   Electronically Signed   By: Corrie Mckusick D.O.   On: 10/18/2014 15:20     Medications:   . sodium chloride    . dextrose 5 % and 0.45 % NaCl with KCl 20 mEq/L 50 mL/hr at 10/10/14 0705   . sodium chloride   Intravenous Once  . atorvastatin  20 mg Oral QHS  . carvedilol  12.5 mg Oral BID  . Chlorhexidine Gluconate Cloth  6 each Topical Q0600  . clopidogrel  75 mg Oral Daily  . insulin aspart  0-24 Units Subcutaneous 6 times per day  . multivitamin  1 tablet Oral Daily  . mupirocin ointment  1 application Nasal BID  .  potassium chloride  20 mEq Oral Once  . vancomycin  250 mg Oral 4 times per day   acetaminophen **OR** acetaminophen, bisacodyl, bismuth subsalicylate, diphenhydrAMINE, fluticasone, iohexol, meclizine, nitroGLYCERIN, ondansetron **OR** ondansetron (ZOFRAN) IV, promethazine, trolamine salicylate  Assessment/ Plan:  59 y.o. female With end-stage renal disease, history of stroke, hypertension, diastolic congestive heart failure, DVT, hyperparathyroidism, breast cancer status post radiation and chemotherapy  1. ESRD. [facility] dialysis. T-T-S - Patient has missed several treatments due to weakness and her illness - Her last dialysis was last Tuesday - At present, her potassium is low, volume status appears to be that she is dehydrated. She is getting IV fluid supplementation - We'll hold off dialysis today until she is more stable 2. Diarrhea. positive C. difficile colitis Colitis also seen by CT abdomen 3. Anemia of chronic kidney disease - No EPO because of recent breast cancer 4. Secondary hyperparathyroidism - Monitor phosphorus during this admission 5. Patient's clinical condition has declined significantly over the last few months. Consider palliative care consultation - discussed with boyfriend - Sister in [place] is POA 6. Hypokalemia - continue potassium with IV fluids    LOS: 2 Jaxen Samples 9/15/201611:35  AM

## 2014-10-10 NOTE — Progress Notes (Signed)
Patient transferred to ICU from 1C. Patient is a long term care resident at [facility]. Clinical Education officer, museum (CSW) gave report to ICU CSW. CSW will continue to follow and assist as needed.   Blima Rich, [place] [phone]

## 2014-10-10 NOTE — Progress Notes (Addendum)
[facility]  Daily Progress Note   Patient will need fistulogram for poorly functioning access. Seems to be stable without HD currently however understand that will change in the near future. Would prefer to wait until patient's C.Diff has improved. If patient decompensates and needs emergent HD or HD is now necessary please alert service.  Discussed with Dr. Ellis Parents [name] PA-C 10/10/2014 9:47 AM

## 2014-10-10 NOTE — Progress Notes (Signed)
Palliative Medicine Inpatient Consult Follow Up Note   Name: Jessica Duran Date: 10/10/2014 MRN: [id number]  DOB: 02/12/1955  Referring Physician: Dustin Flock, MD  Palliative Care consult requested for this 59 y.o. female for goals of medical therapy in patient with life threatening sepsis with multiple co-morbidities.  IMPRESSION: 1. Sepsis --CDiff related ---she had been on an ABX at the nursing facility recently for treatment of pneumonia ---CDiff toxin and antigen are positive ---with altered mental status  ---waking up some this evening --- on broad spectrum ABX 2. ESRD on HD 3. 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 Kirt Boys who saw her as recently as 07/31/14 ---also treated with adjuvant Cytoxan and Adriamycin and Taxol ( by Dr Ma Hillock) 4. DM2 --with diabetic retinopathy 5. Vascular access problems --was to get Fhuntogram (declotting?) but ended up here so that procedure was cancelled. 7. Moderate to large right sided pleural effusion (same side as breast cancer) 8. Fibromyalgia  9. Electrolyte dissarray --being treated  10. Coagulopathy --was given Vit K (INR was 7.94) --not bleeding 11. History of substance abuse (cocaine)  12. H/O CVA times FOUR (1997, 2004, and 2013, and Feb 2016) -- first three due to or related to crack cocaine use ---She was started on Eliquis 2.5 mg bid and a statin along with Plavix for CVA in Feb 2016 ---CVA Feb 2016 was located in left middle cerebellar peduncle and this left her with some left sided weakness, dizziness, and diplopia (for which an alternating eye patch was RXd and for which she was admitted to a SNF for rehab and has since continued on as a long term care patient).  13. Secondary Hyperparathyroidism 14. Chronic compensated  Diastolic CHF (nl EF) 15. H/O DVT 16. Insomnia 17. Depression and anxiety with h/o past suicide ideation and psychiatric stay at [facility]. 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 Noncompliance with dialysis sessions --she misses it often 20. Tobacco Use 21. Hyponatremia  ------------------------------------------- Today's Discussions, Decisions, and Plans: 1. DNR is expanded to DNI and NO permanent Feeding tubes  2.  Continue current care (pressors ok, IV elective antiarrythmics OK if pt is not in a cardio-pulmonary arrest situation )  3.  I will continue to talk with various family and sig other daily and we will consider compassionate withdrawal of care as an option to discuss if it looks like this would be appropriate to bring up within the next couple of days.      REVIEW OF SYSTEMS:  Patient is not able to provide ROS due to critical illness   CODE STATUS: DNR  --confirmed again in talking with boyfriend and sister, [name], who was the primary contact at the nursing facility (though boyfriend is included in getting info as well).  No one is a HCPOA. I have called [name] (sister in [place]) and ALSO daughter, [name] in [place], [place]. Both confirmed DNR and NO LIFE SUPPORT (DNI and NO permanent feeding tubes).      PAST MEDICAL HISTORY: Past Medical History  Diagnosis Date  . History of stroke   . Hypertension   . Chronic kidney disease     stage III  . Diastolic congestive heart failure     ef 55-60%  . Fibromyalgia   . DVT (deep venous thrombosis)   . Hyperparathyroidism   . Stroke   .  Diabetes mellitus without complication   . 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 Renal insufficiency     PAST SURGICAL HISTORY:  Past Surgical History  Procedure Laterality Date  . Appendectomy    . Cesarean section      x3  . Breast lumpectomy Right may 2015  . Breast biopsy Right     03/27/2013 positive  . Status post radiation Left     breast cancer   . Status post chemo Left     breast cancer    Vital Signs: BP 96/73 mmHg  Pulse 75  Temp(Src) 95.2 F (35.1 C) (Rectal)  Resp 14  Ht 5\' 4"  (1.626 m)  Wt 53.706 kg (118 lb 6.4 oz)  BMI 20.31 kg/m2  SpO2 100% Filed Weights   10/22/2014 1408 10/07/2014 2141  Weight: 61.689 kg (136 lb) 53.706 kg (118 lb 6.4 oz)    Estimated body mass index is 20.31 kg/(m^2) as calculated from the following:   Height as of this encounter: 5\' 4"  (1.626 m).   Weight as of this encounter: 53.706 kg (118 lb 6.4 oz).  PHYSICAL EXAM: She   LABS: CBC:    Component Value Date/Time   WBC 15.3* 10/10/2014 0513   WBC 3.0* 05/07/2014 1059   HGB 9.5* 10/10/2014 0513   HGB 10.0* 05/07/2014 1059   HCT 28.4* 10/10/2014 0513   HCT 31.3* 05/07/2014 1059   PLT 184 10/10/2014 0513   PLT 161 05/07/2014 1059   MCV 87.0 10/10/2014 0513   MCV 86 05/07/2014 1059   NEUTROABS 23.4* 10/11/2014 1609   NEUTROABS 1.4 05/07/2014 1059   LYMPHSABS 0.7* 09/29/2014 1609   LYMPHSABS 1.0 05/07/2014 1059   MONOABS 0.9 10/19/2014 1609   MONOABS 0.4 05/07/2014 1059   EOSABS 0.0 09/26/2014 1609   EOSABS 0.2 05/07/2014 1059   BASOSABS 0.0 10/11/2014 1609   BASOSABS 0.0 05/07/2014 1059   BASOSABS 4 11/18/2013 0447   Comprehensive Metabolic Panel:    Component Value Date/Time   NA 134* 10/10/2014 0513   NA 141 02/22/2014 0906   K 2.8* 10/10/2014 0513   K 2.7* 10/10/2014 0513   K 3.9 02/22/2014 0906   CL 105 10/10/2014 0513   CL 104 02/22/2014 0906   CO2 17* 10/10/2014 0513   CO2 30 02/22/2014 0906   BUN 92* 10/10/2014 0513   BUN 21* 02/22/2014 0906   CREATININE 7.10* 10/10/2014 0513   CREATININE 4.10* 02/22/2014 0906   GLUCOSE 115* 10/10/2014 0513   GLUCOSE 155* 02/22/2014 0906   CALCIUM 7.7* 10/10/2014 0513   CALCIUM 8.5 02/22/2014 0906   AST 25 10/14/2014 1233   AST 24 02/22/2014 0906   ALT 8* 10/06/2014 1233   ALT 33 02/22/2014 0906   ALKPHOS 78 10/21/2014 1233   ALKPHOS 100 02/22/2014 0906   BILITOT 1.0  10/23/2014 1233   BILITOT 0.2 02/22/2014 0906   PROT 5.2* 09/27/2014 1233   PROT 6.5 02/22/2014 0906   ALBUMIN 1.9* 10/24/2014 1233   ALBUMIN 3.0* 02/22/2014 0906   TESTS: See those documented on initial consult note  She had US guided R thoracentesis at 2:30 today  CXR: Markedly improved aeration in lower RIGHT lung post thoracentesis. No pneumothorax. ---------------------------------------------------------------------------------------------- See top of note for Impression and Plan  More than 50% of the visit was spent in counseling/coordination of care: YES  Time Spent: 45 min

## 2014-10-10 NOTE — Progress Notes (Signed)
[facility] [place] NAME: Jessica Duran    MR#:  [id number]  DATE OF BIRTH:  05-15-1955  SUBJECTIVE:  Patient awake but unable to communicate awake,   REVIEW OF SYSTEMS:  Unobtainable as the patient is  with altered mentation  DRUG ALLERGIES:   Allergies  Allergen Reactions  . Acetaminophen Other (See Comments)    Reaction:  Unknown   . Hydralazine Other (See Comments)    Reaction:  Unknown   . Isosorbide Other (See Comments)    Reaction:  Unknown   . Levaquin [Levofloxacin In D5w] Other (See Comments)    Reaction:  Unknown   . Lisinopril Other (See Comments)    Reaction:  Caused pt to have a stroke.    Marland Kitchen Oxycodone Other (See Comments)    Reaction:  Unknown   . Penicillins Other (See Comments)    Reaction:  Unknown     VITALS:  Blood pressure 94/68, pulse 80, temperature 97.4 F (36.3 C), temperature source Tympanic, resp. rate 16, height 5\' 4"  (1.626 m), weight 53.706 kg (118 lb 6.4 oz), SpO2 100 %.  PHYSICAL EXAMINATION:  GENERAL:  59 y.o.-year-old patient lying in the bed  chronically ill   EYES: Pupils equal, round, reactive to light and accommodation. No scleral icterus. HEENT: Head atraumatic, normocephalic. Oropharynx and nasopharynx clear.  NECK:  Supple, no jugular venous distention. No thyroid enlargement, no tenderness.  LUNGS: Coarse breath sounds on left side, minimal air movement on the right , no wheezing or crepitation. No use of accessory muscles of respiration.  CARDIOVASCULAR: S1, S2 normal. No murmurs, rubs, or gallops.  ABDOMEN: Soft, diffusely tender, nondistended. Bowel sounds present EXTREMITIES: No pedal edema, cyanosis, or clubbing.  NEUROLOGIC: Patient is with altered mental status.  PSYCHIATRIC: The patient is with altered mental status SKIN: No obvious rash, lesion, or ulcer.    LABORATORY PANEL:   CBC  Recent Labs Lab 10/10/14 0513  WBC 15.3*  HGB 9.5*  HCT 28.4*  PLT 184    ------------------------------------------------------------------------------------------------------------------  Chemistries   Recent Labs Lab 10/11/2014 1233 10/10/14 0513  NA 133* 134*  K 3.0* 2.8*  CL 103 105  CO2 16* 17*  GLUCOSE 157* 115*  BUN 89* 92*  CREATININE 6.99* 7.10*  CALCIUM 7.6* 7.7*  AST 25  --   ALT 8*  --   ALKPHOS 78  --   BILITOT 1.0  --    ------------------------------------------------------------------------------------------------------------------  Cardiac Enzymes  Recent Labs Lab 10/01/2014 1515  TROPONINI <0.03   ------------------------------------------------------------------------------------------------------------------  RADIOLOGY:  Dg Chest 1 View  10/13/2014   CLINICAL DATA:  RIGHT pleural effusion post thoracentesis, hypertension, stroke, CHF, breast cancer, former smoker, diabetes mellitus  EXAM: CHEST  1 VIEW  COMPARISON:  10/16/2014  FINDINGS: RIGHT jugular Port-A-Cath with tip projecting over SVC.  Upper normal size of cardiac silhouette.  Atherosclerotic calcification aorta.  Marked decrease in RIGHT pleural effusion post thoracentesis.  Near complete resolution of previously identified RIGHT basilar atelectasis.  No pulmonary infiltrate, pleural effusion or pneumothorax.  Bones demineralized.  IMPRESSION: Markedly improved aeration in lower RIGHT lung post thoracentesis.  No pneumothorax.   Electronically Signed   By: Lavonia Dana M.D.   On: 10/21/2014 15:00   Ct Abdomen Pelvis W Contrast  10/02/2014   CLINICAL DATA:  End-stage renal disease hypertension dense of heart failure, hypotension after dialysis, nausea and vomiting abdominal pain, elevated white blood count and 25,000  EXAM: CT ABDOMEN AND PELVIS  WITH CONTRAST  TECHNIQUE: Multidetector CT imaging of the abdomen and pelvis was performed using the standard protocol following bolus administration of intravenous contrast.  CONTRAST:  164mL OMNIPAQUE IOHEXOL 300 MG/ML  SOLN   COMPARISON:  07/12/2013  FINDINGS: Lower chest: Large right pleural effusion displaces heart and mediastinal contents towards the left. There is a small pericardial effusion. Significant atelectasis on the right.  Hepatobiliary: Gallbladder mildly distended. A few tiny calculi are seen within the gallbladder. There is choledocholithiasis with a 3 mm stone in the mid common bile duct common bile duct is 7 mm in diameter, upper normal. Choledocholithiasis was present on prior study. No intrahepatic biliary dilatation.  Pancreas: negative  Spleen: negative  Adrenals/Urinary Tract: Adrenal glands negative. 2 cm cyst midpole and 4 cm cyst lower pole right kidney. These are stable. 1 cm cyst midpole left kidney stable. A few tiny renal calcifications bilaterally are noted. No hydronephrosis. Bladder is normal.  Stomach/Bowel: Stomach and small bowel are normal. There is mild wall thickening of the rectum and sigmoid colon. There is severe wall thickening of the descending colon and distal half of the transverse colon. Proximal half of the transverse colon, ascending colon, and cecum show mild wall thickening. There is mild inflammatory change in the pericolic gutter surrounding the descending colon. Nonobstructive gas pattern.  Vascular/Lymphatic: Moderate atherosclerotic calcification of the aortoiliac vessels.  Reproductive: Uterus is enlarged with numerous masses, some of which show calcifications characteristic of fibroids. Other masses are noncalcified and mildly hyper attenuating.  Other: Small volume of ascites in the abdomen primarily along the margin of the liver. Small volume of ascites in the pelvis.  Musculoskeletal: Mild increased attenuation throughout the subcutaneous soft tissues consistent with anasarca.  IMPRESSION: 1. Wall thickening throughout the large bowel consistent with inflammation particularly severe in the descending colon. In the setting of elevated white blood count this is concerning for  colitis. 2. Large right pleural effusion exerting mass effect on mediastinal contents and causing significant acute atelectasis. 3. Chronic cholelithiasis and choledocholithiasis. No significant biliary dilatation. 4. Small volume of ascites.  Evidence of anasarca. 5. Uterine fibroids   Electronically Signed   By: Skipper Cliche M.D.   On: 10/23/2014 21:51   Dg Chest Port 1 View  10/07/2014   CLINICAL DATA:  Short of breath. Patient missed dialysis. Volume overload.  EXAM: PORTABLE CHEST - 1 VIEW  COMPARISON:  12/14/2014.  FINDINGS: There is a moderate RIGHT pleural effusion. This layers posteriorly and produces hazy opacity over the entire RIGHT chest. The cardiopericardial silhouette is enlarged. LEFT IJ power port is present. Aortic atherosclerosis. Collapse/ consolidation of the RIGHT lower lobe associated with pleural effusion. No LEFT pleural effusion.  When compared to 12/13/2013, the RIGHT IJ dialysis catheter has been removed.  IMPRESSION: Volume overload with cardiomegaly and moderate RIGHT pleural effusion.   Electronically Signed   By: Dereck Ligas M.D.   On: 09/28/2014 16:45   US Thoracentesis Asp Pleural Space W/img Guide  10/16/2014   CLINICAL DATA:  59 year old female with [facility]. Right pleural effusion. She has been referred for image guided thoracentesis  EXAM: ULTRASOUND GUIDED RIGHT THORACENTESIS  COMPARISON:  CT 10/14/2014  PROCEDURE: An ultrasound guided thoracentesis was thoroughly discussed with the patient and questions answered. The benefits, risks, alternatives and complications were also discussed. The patient understands and wishes to proceed with the procedure. Written consent was obtained.  Ultrasound was performed to localize and mark an adequate pocket of fluid in the right  chest. The area was then prepped and draped in the normal sterile fashion. 1% Lidocaine was used for local anesthesia. Under ultrasound guidance a Safe-T-Centesis catheter was introduced.  Thoracentesis was performed. The catheter was removed and a dressing applied.  Complications:  COMPLICATIONS: None  FINDINGS: A total of approximately 1.2 L of amber thin fluid was removed. A fluid sample wassent for laboratory analysis.  IMPRESSION: Status post ultrasound-guided right-sided thoracentesis. 1.2 L of amber colored thin fluid removed.  Signed,  Dulcy Fanny. Earleen Newport, DO  [facility]  [facility]   Electronically Signed   By: Corrie Mckusick D.O.   On: 10/20/2014 15:20    EKG:   Orders placed or performed during the hospital encounter of 09/26/2014  . ED EKG  . 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 sepsis due to C. difficile colitis, appreciate infectious disease input. By mouth vancomycin   2. Moderate to large right-sided pleural effusion may be underlying pneumonia  monitor for now  3. Diabetes 2 Sliding scale glipizide on hold  4. Vascular accesess: or per vasuclar, inr elevated ffp prior to procedure  5. Chronic AC-  Ac on hold due to or  6. ESRD   appreciate nephorlogy consult for hd  7. Hypokalemia k replacement  low dose K+was given   nephrology is considering to add potassium to the IV fluids    8.coagulopathy  Improved with vit k,ffp prior to proceudre  All the records are reviewed and case discussed with Care Management/Social Workerr. Management plans discussed with the patient, family and they are in agreement.   TOTAL TIME TAKING CARE OF THIS PATIENT: 22minutes.  Greater than 50% time was spent on coordination of care     Dustin Flock M.D on 10/10/2014 at 11:49 AM  Between 7am to 6pm - Pager - [phone] After 6pm go to [email or web address] - password [email or web address] [facility]  Office  [phone]  CC: Primary care physician; CAMPBELL, Creola Corn, MD

## 2014-10-10 NOTE — Consult Note (Addendum)
Palliative Medicine Inpatient Consult Note   Name: Jessica Duran Date: 10/10/2014 MRN: [id number]  DOB: 1955-03-17  ----------------------------------------------------------------------------------------------------------------- IMPORTANT NOTE:  Pt has recently been seen as an outpt by Roxana Hires, FNP.  I am asked to do a Palliative Care consult and I am Colleen Can, MD. We are not the same person.  I mention this to try to help diminish the degree of confusion about this matter (as it has come up before).   Referring Physician: Dustin Flock, MD  Palliative Care consult requested for this 59 y.o. female for goals of medical therapy in patient with severe sepsis.  She has multiple medical problems as outlined below.  IMPRESSION: 1.  Sepsis --possibly due to acute colitis ---with altered mental status  ---waking up some this evening --- on broad spectrum ABX 2.  ESRD on HD 3.  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 Kirt Boys who saw her as recently as 07/31/14 ---also treated with adjuvant Cytoxan and Adriamycin and Taxol ( by Dr Ma Hillock) 4. DM2 --with diabetic retinopathy 5. Vascular access problems --was to get Fhuntogram (declotting?) but ended up here so that procedure was cancelled. 7.  Moderate to large right sided pleural effusion (same side as breast cancer) 8.  Fibromyalgia  9.  Electrolyte dissarray --being treated  10.  Coagulopathy --was given Vit K (INR was 7.94) --not bleeding 11.  History of substance abuse (cocaine)  12.  H/O CVA times FOUR (1997, 2004, and 2013, and Feb 2016)  -- first three due to or related to crack cocaine use ---She was started on Eliquis 2.5 mg bid and a statin along with Plavix for CVA in Feb 2016 ---CVA Feb 2016 was located in left middle  cerebellar peduncle and this  left her with some left sided weakness, dizziness, and diplopia (for which an alternating eye patch was RXd and for which she was admitted to a SNF for rehab and has since continued on as a long term care patient).  13.  Secondary Hyperparathyroidism 14.  Chronic compensated Diastolic CHF (nl EF) 15.  H/O DVT 16.  Insomnia 17.  Depression and anxiety with h/o past suicide ideation and psychiatric stay at [facility].  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  Noncompliance with dialysis sessions --she misses it often 20.  Tobacco Use 21.  Hyponatremia      REVIEW OF SYSTEMS:  Patient is not able to provide ROS due to critical illness  SPIRITUAL SUPPORT SYSTEM: Yes --boyfriend.  SOCIAL HISTORY:  reports that she quit smoking about 20 months ago. Her smoking use included Cigarettes. She does not have any smokeless tobacco history on file. She reports that she uses illicit drugs (Cocaine). She reports that she does not drink alcohol.  She has recently been residing at [facility] as a [place] pt (she has Medicare and Medicaid).  She is an established dialysis patient.  Her boyfriend is the one who is most involved in her life and her care. His name is Waldo Laine at home # [phone] and mobile # [phone].  She is a widow.  She has three children who do not have 'anything to do with her' per report passed on to me by Soc. Work (info obtained from two sisters who are out of town).  There is  some question about one of the sister's possibly being HCPOA --but this is not confirmed.  The only person reached today by me  is the boyfriend.   LEGAL DOCUMENTS:  None Pt reportedly has a [place] DNR form at home on her refrigerator (per boyfriend).   CODE STATUS: DNR  As of now.  She has been DNR 'for a long time' per boyfriend.  Could not reach others.  Am confident boyfriend's report is accurate and have changed pt to DNR status.     PAST  MEDICAL HISTORY: Past Medical History  Diagnosis Date  . History of stroke   . Hypertension   . Chronic kidney disease     stage III  . Diastolic congestive heart failure     ef 55-60%  . Fibromyalgia   . DVT (deep venous thrombosis)   . Hyperparathyroidism   . Stroke   . Diabetes mellitus without complication   . 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 Renal insufficiency     PAST SURGICAL HISTORY:  Past Surgical History  Procedure Laterality Date  . Appendectomy    . Cesarean section      x3  . Breast lumpectomy Right may 2015  . Breast biopsy Right     03/27/2013 positive  . Status post radiation Left     breast cancer  . Status post chemo Left     breast cancer    ALLERGIES:  is allergic to acetaminophen; hydralazine; isosorbide; levaquin; lisinopril; oxycodone; oxycodone-acetaminophen; and penicillins.  MEDICATIONS:  Current Facility-Administered Medications  Medication Dose Route Frequency Provider Last Rate Last Dose  . 0.9 %  sodium chloride infusion   Intravenous Continuous Kimberly A Stegmayer, PA-C      . acetaminophen (TYLENOL) tablet 650 mg  650 mg Oral Q6H PRN Dustin Flock, MD   650 mg at 10/15/2014 2037   Or  . acetaminophen (TYLENOL) suppository 650 mg  650 mg Rectal Q6H PRN Dustin Flock, MD      . atorvastatin (LIPITOR) tablet 20 mg  20 mg Oral QHS Dustin Flock, MD   20 mg at 10/02/2014 2037  . bisacodyl (DULCOLAX) EC tablet 10 mg  10 mg Oral Daily PRN Dustin Flock, MD      . bismuth subsalicylate (PEPTO BISMOL) 262 MG/15ML suspension 30 mL  30 mL Oral Q4H PRN Dustin Flock, MD      . Chlorhexidine Gluconate Cloth 2 % PADS 6 each  6 each Topical Q0600 Dustin Flock, MD   6 each at 10/10/14 0600  . clopidogrel (PLAVIX) tablet 75 mg  75 mg Oral Daily Dustin Flock, MD   75 mg at 10/10/14 1105  . dextrose 5 % and 0.45 % NaCl with KCl 20 mEq/L infusion   Intravenous Continuous Murlean Iba, MD   Stopped at 10/10/14 1138  . diphenhydrAMINE (BENADRYL)  capsule 25 mg  25 mg Oral QHS PRN Lance Coon, MD   25 mg at 10/10/14 0057  . fluticasone (FLONASE) 50 MCG/ACT nasal spray 2 spray  2 spray Each Nare Daily PRN Dustin Flock, MD      . insulin aspart (novoLOG) injection 0-24 Units  0-24 Units Subcutaneous 6 times per day Sela Hua, PA-C   2 Units at 10/10/14 1139  . iohexol (OMNIPAQUE) 240 MG/ML injection 25 mL  25 mL Oral Once PRN Medication Radiologist, MD   25 mL at 10/22/2014 1848  . meclizine (ANTIVERT) tablet 25 mg  25 mg Oral Q12H PRN Dustin Flock,  MD      . metroNIDAZOLE (FLAGYL) IVPB 500 mg  500 mg Intravenous Q8H Wilhelmina Mcardle, MD      . multivitamin (RENA-VIT) tablet 1 tablet  1 tablet Oral Daily Dustin Flock, MD   1 tablet at 10/10/14 1105  . mupirocin ointment (BACTROBAN) 2 % 1 application  1 application Nasal BID Dustin Flock, MD   1 application at 93/23/55 1140  . nitroGLYCERIN (NITROSTAT) SL tablet 0.4 mg  0.4 mg Sublingual Q5 min PRN Dustin Flock, MD      . norepinephrine (LEVOPHED) 4 mg in dextrose 5 % 250 mL (0.016 mg/mL) infusion  0-40 mcg/min Intravenous Titrated Dustin Flock, MD 7.5 mL/hr at 10/10/14 1347 2 mcg/min at 10/10/14 1347  . ondansetron (ZOFRAN) tablet 4 mg  4 mg Oral Q6H PRN Dustin Flock, MD       Or  . ondansetron (ZOFRAN) injection 4 mg  4 mg Intravenous Q6H PRN Dustin Flock, MD      . promethazine (PHENERGAN) tablet 25 mg  25 mg Oral Q6H PRN Dustin Flock, MD      . trolamine salicylate (ASPERCREME) 10 % cream 1 application  1 application Topical PRN Dustin Flock, MD      . vancomycin (VANCOCIN) 50 mg/mL oral solution 250 mg  250 mg Oral 4 times per day Adrian Prows, MD   250 mg at 10/10/14 1139   Facility-Administered Medications Ordered in Other Encounters  Medication Dose Route Frequency Provider Last Rate Last Dose  . heparin flush 10 UNIT/ML injection 10 Units  10 Units Intravenous Once Leia Alf, MD   10 Units at 08/16/14 1155  . sodium chloride 0.9 % injection 10 mL   10 mL Intracatheter PRN Leia Alf, MD   10 mL at 08/16/14 1204    Vital Signs: BP 95/56 mmHg  Pulse 69  Temp(Src) 93.2 F (34 C) (Rectal)  Resp 19  Ht 5\' 4"  (1.626 m)  Wt 53.706 kg (118 lb 6.4 oz)  BMI 20.31 kg/m2  SpO2 100% Filed Weights   10/07/2014 1408 10/18/2014 2141  Weight: 61.689 kg (136 lb) 53.706 kg (118 lb 6.4 oz)    Estimated body mass index is 20.31 kg/(m^2) as calculated from the following:   Height as of this encounter: 5\' 4"  (1.626 m).   Weight as of this encounter: 53.706 kg (118 lb 6.4 oz).  PERFORMANCE STATUS (ECOG) : 4 - Bedbound currently due to critical illness  PHYSICAL EXAM: Lying on bed  --sleeping, opens eyes  EOMI OP appears clear anteriorly Neck w/o JVD or Tm Hrt rrr no m heard Lungs cta anteriorly Abd with scant BS Ext cool   LABS: CBC:    Component Value Date/Time   WBC 15.3* 10/10/2014 0513   WBC 3.0* 05/07/2014 1059   HGB 9.5* 10/10/2014 0513   HGB 10.0* 05/07/2014 1059   HCT 28.4* 10/10/2014 0513   HCT 31.3* 05/07/2014 1059   PLT 184 10/10/2014 0513   PLT 161 05/07/2014 1059   MCV 87.0 10/10/2014 0513   MCV 86 05/07/2014 1059   NEUTROABS 23.4* 10/09/2014 1609   NEUTROABS 1.4 05/07/2014 1059   LYMPHSABS 0.7* 09/26/2014 1609   LYMPHSABS 1.0 05/07/2014 1059   MONOABS 0.9 10/21/2014 1609   MONOABS 0.4 05/07/2014 1059   EOSABS 0.0 09/28/2014 1609   EOSABS 0.2 05/07/2014 1059   BASOSABS 0.0 10/22/2014 1609   BASOSABS 0.0 05/07/2014 1059   BASOSABS 4 11/18/2013 0447   Comprehensive Metabolic Panel:    Component  Value Date/Time   NA 134* 10/10/2014 0513   NA 141 02/22/2014 0906   K 2.8* 10/10/2014 0513   K 2.7* 10/10/2014 0513   K 3.9 02/22/2014 0906   CL 105 10/10/2014 0513   CL 104 02/22/2014 0906   CO2 17* 10/10/2014 0513   CO2 30 02/22/2014 0906   BUN 92* 10/10/2014 0513   BUN 21* 02/22/2014 0906   CREATININE 7.10* 10/10/2014 0513   CREATININE 4.10* 02/22/2014 0906   GLUCOSE 115* 10/10/2014 0513   GLUCOSE 155*  02/22/2014 0906   CALCIUM 7.7* 10/10/2014 0513   CALCIUM 8.5 02/22/2014 0906   AST 25 10/24/2014 1233   AST 24 02/22/2014 0906   ALT 8* 10/19/2014 1233   ALT 33 02/22/2014 0906   ALKPHOS 78 10/18/2014 1233   ALKPHOS 100 02/22/2014 0906   BILITOT 1.0 10/01/2014 1233   BILITOT 0.2 02/22/2014 0906   PROT 5.2* 10/22/2014 1233   PROT 6.5 02/22/2014 0906   ALBUMIN 1.9* 10/07/2014 1233   ALBUMIN 3.0* 02/22/2014 0906   --------------------------------------------------------------------------------------------------------------------- TESTS:  CT abdomen pelvis wo CM  09/26/2014 1. Wall thickening throughout the large bowel consistent with inflammation particularly severe in the descending colon. In the setting of elevated white blood count this is concerning for colitis. 2. Large right pleural effusion exerting mass effect on mediastinal contents and causing significant acute atelectasis. 3. Chronic cholelithiasis and choledocholithiasis. No significant biliary dilatation. 4. Small volume of ascites. Evidence of anasarca. 5. Uterine fibroids  CXR 10/08/14: Volume overload with cardiomegaly and moderate RIGHT pleural Effusion.  Last ECHO in record 05/2013: EF 55 -60% Mild Mr Mild Pulmonary HTN   MM diagnostic Breast tomogram 07/16/14: There are new postlumpectomy and postradiation changes of the right breast. An asymmetry was identified within the superior right breast in the area of recent lumpectomy which persisted on the spot tangential view. No suspicious abnormality was identified within the left breast.Targeted ultrasound is performed, showing probable scar tissue within the right breast at 11 o'clock, 6 cm from the nipple which is thought to correspond to the asymmetry seen mammographically. This finding could be seen to connect to the incision at the skin surface. IMPRESSION: Probably benign posttreatment changes of the right breast. RECOMMENDATION: Right diagnostic  mammogram and possible ultrasound in 6 months. --------------------------------------------------------------------------------------------------------------------- See top of note for plan  More than 50% of the visit was spent in counseling/coordination of care: Yes  Time Spent: 80-minutes

## 2014-10-10 NOTE — Progress Notes (Signed)
Lab Tech, P[place] called back requesting order for phos and potassium to be re-entered so they can run labs from most recent sample.

## 2014-10-10 NOTE — Progress Notes (Signed)
[place] [place] NAME: Jessica Duran    MR#:  [id number]  DATE OF BIRTH:  [birth year]  SUBJECTIVE:  Patient now has hypotension and hypothermic. Eyes open but not  REVIEW OF SYSTEMS:  Unobtainable as the patient is  with altered mentation  DRUG ALLERGIES:   Allergies  Allergen Reactions  . Acetaminophen Other (See Comments)    Reaction:  Unknown   . Hydralazine Other (See Comments)    Reaction:  Unknown   . Isosorbide Other (See Comments)    Reaction:  Unknown   . Levaquin [Levofloxacin In D5w] Other (See Comments)    Reaction:  Unknown   . Lisinopril Other (See Comments)    Reaction:  Caused pt to have a stroke.    Marland Kitchen Oxycodone Other (See Comments)    Reaction:  Unknown   . Penicillins Other (See Comments)    Reaction:  Unknown     VITALS:  Blood pressure 88/47, pulse 72, temperature 92.9 F (33.8 C), temperature source Rectal, resp. rate 18, height 5\' 4"  (1.626 m), weight 53.706 kg (118 lb 6.4 oz), SpO2 2 %.  PHYSICAL EXAMINATION:  GENERAL:  59 y.o.-year-old patient lying in the bed  critically ill EYES: Pupils equal, round, reactive to light and accommodation. No scleral icterus. HEENT: Head atraumatic, normocephalic. Oropharynx and nasopharynx clear.  NECK:  Supple, no jugular venous distention. No thyroid enlargement, no tenderness.  LUNGS: Coarse breath sounds on left side, minimal air movement on the right , no wheezing or crepitation. No use of accessory muscles of respiration.  CARDIOVASCULAR: S1, S2 normal. No murmurs, rubs, or gallops.  ABDOMEN: Soft, diffusely tender, nondistended. Bowel sounds present EXTREMITIES: No pedal edema, cyanosis, or clubbing.  NEUROLOGIC: Patient is with altered mental status.  PSYCHIATRIC: The patient is with altered mental status SKIN: No obvious rash, lesion, or ulcer.    LABORATORY PANEL:   CBC  Recent Labs Lab 10/10/14 0513  WBC 15.3*  HGB 9.5*  HCT 28.4*  PLT  184   ------------------------------------------------------------------------------------------------------------------  Chemistries   Recent Labs Lab 10/24/2014 1233 10/10/14 0513  NA 133* 134*  K 3.0* 2.8*  CL 103 105  CO2 16* 17*  GLUCOSE 157* 115*  BUN 89* 92*  CREATININE 6.99* 7.10*  CALCIUM 7.6* 7.7*  AST 25  --   ALT 8*  --   ALKPHOS 78  --   BILITOT 1.0  --    ------------------------------------------------------------------------------------------------------------------  Cardiac Enzymes  Recent Labs Lab 10/25/2014 1515  TROPONINI <0.03   ------------------------------------------------------------------------------------------------------------------  RADIOLOGY:  Dg Chest 1 View  10/22/2014   CLINICAL DATA:  RIGHT pleural effusion post thoracentesis, hypertension, stroke, CHF, breast cancer, former smoker, diabetes mellitus  EXAM: CHEST  1 VIEW  COMPARISON:  09/28/2014  FINDINGS: RIGHT jugular Port-A-Cath with tip projecting over SVC.  Upper normal size of cardiac silhouette.  Atherosclerotic calcification aorta.  Marked decrease in RIGHT pleural effusion post thoracentesis.  Near complete resolution of previously identified RIGHT basilar atelectasis.  No pulmonary infiltrate, pleural effusion or pneumothorax.  Bones demineralized.  IMPRESSION: Markedly improved aeration in lower RIGHT lung post thoracentesis.  No pneumothorax.   Electronically Signed   By: Lavonia Dana M.D.   On: 10/04/2014 15:00   Ct Abdomen Pelvis W Contrast  09/28/2014   CLINICAL DATA:  End-stage renal disease hypertension dense of heart failure, hypotension after dialysis, nausea and vomiting abdominal pain, elevated white blood count and 25,000  EXAM: CT ABDOMEN AND PELVIS  WITH CONTRAST  TECHNIQUE: Multidetector CT imaging of the abdomen and pelvis was performed using the standard protocol following bolus administration of intravenous contrast.  CONTRAST:  166mL OMNIPAQUE IOHEXOL 300 MG/ML  SOLN   COMPARISON:  07/12/2013  FINDINGS: Lower chest: Large right pleural effusion displaces heart and mediastinal contents towards the left. There is a small pericardial effusion. Significant atelectasis on the right.  Hepatobiliary: Gallbladder mildly distended. A few tiny calculi are seen within the gallbladder. There is choledocholithiasis with a 3 mm stone in the mid common bile duct common bile duct is 7 mm in diameter, upper normal. Choledocholithiasis was present on prior study. No intrahepatic biliary dilatation.  Pancreas: negative  Spleen: negative  Adrenals/Urinary Tract: Adrenal glands negative. 2 cm cyst midpole and 4 cm cyst lower pole right kidney. These are stable. 1 cm cyst midpole left kidney stable. A few tiny renal calcifications bilaterally are noted. No hydronephrosis. Bladder is normal.  Stomach/Bowel: Stomach and small bowel are normal. There is mild wall thickening of the rectum and sigmoid colon. There is severe wall thickening of the descending colon and distal half of the transverse colon. Proximal half of the transverse colon, ascending colon, and cecum show mild wall thickening. There is mild inflammatory change in the pericolic gutter surrounding the descending colon. Nonobstructive gas pattern.  Vascular/Lymphatic: Moderate atherosclerotic calcification of the aortoiliac vessels.  Reproductive: Uterus is enlarged with numerous masses, some of which show calcifications characteristic of fibroids. Other masses are noncalcified and mildly hyper attenuating.  Other: Small volume of ascites in the abdomen primarily along the margin of the liver. Small volume of ascites in the pelvis.  Musculoskeletal: Mild increased attenuation throughout the subcutaneous soft tissues consistent with anasarca.  IMPRESSION: 1. Wall thickening throughout the large bowel consistent with inflammation particularly severe in the descending colon. In the setting of elevated white blood count this is concerning for  colitis. 2. Large right pleural effusion exerting mass effect on mediastinal contents and causing significant acute atelectasis. 3. Chronic cholelithiasis and choledocholithiasis. No significant biliary dilatation. 4. Small volume of ascites.  Evidence of anasarca. 5. Uterine fibroids   Electronically Signed   By: Skipper Cliche M.D.   On: 10/06/2014 21:51   Dg Chest Port 1 View  09/30/2014   CLINICAL DATA:  Short of breath. Patient missed dialysis. Volume overload.  EXAM: PORTABLE CHEST - 1 VIEW  COMPARISON:  12/14/2014.  FINDINGS: There is a moderate RIGHT pleural effusion. This layers posteriorly and produces hazy opacity over the entire RIGHT chest. The cardiopericardial silhouette is enlarged. LEFT IJ power port is present. Aortic atherosclerosis. Collapse/ consolidation of the RIGHT lower lobe associated with pleural effusion. No LEFT pleural effusion.  When compared to 12/13/2013, the RIGHT IJ dialysis catheter has been removed.  IMPRESSION: Volume overload with cardiomegaly and moderate RIGHT pleural effusion.   Electronically Signed   By: Dereck Ligas M.D.   On: 09/29/2014 16:45   US Thoracentesis Asp Pleural Space W/img Guide  10/01/2014   CLINICAL DATA:  59 year old female with [facility] toe [facility]. Right pleural effusion. She has been referred for image guided thoracentesis  EXAM: ULTRASOUND GUIDED RIGHT THORACENTESIS  COMPARISON:  CT 10/15/2014  PROCEDURE: An ultrasound guided thoracentesis was thoroughly discussed with the patient and questions answered. The benefits, risks, alternatives and complications were also discussed. The patient understands and wishes to proceed with the procedure. Written consent was obtained.  Ultrasound was performed to localize and mark an adequate pocket of fluid in the right  chest. The area was then prepped and draped in the normal sterile fashion. 1% Lidocaine was used for local anesthesia. Under ultrasound guidance a Safe-T-Centesis catheter was introduced.  Thoracentesis was performed. The catheter was removed and a dressing applied.  Complications:  COMPLICATIONS: None  FINDINGS: A total of approximately 1.2 L of amber thin fluid was removed. A fluid sample wassent for laboratory analysis.  IMPRESSION: Status post ultrasound-guided right-sided thoracentesis. 1.2 L of amber colored thin fluid removed.  Signed,  Dulcy Fanny. Earleen Newport, DO  [facility]  [facility]   Electronically Signed   By: Corrie Mckusick D.O.   On: 10/21/2014 15:20    EKG:   Orders placed or performed during the hospital encounter of 10/02/2014  . ED EKG  . ED EKG    ASSESSMENT AND PLAN:   #1 severe sepsis, now with hypotension and hypothermia: I will give patient normal saline 500 cc bolus, started on Levophed. For her hyperthermia we will start her on a b bear hugger.    Prognosis very poor patient will be moved to the ICU she is critically ill and high risk of death, her long-term significant other at the bedside she is not married to him but they have been together I updated them on the condition. Patient does have children but they're not involved in her care.     All the records are reviewed and case discussed with Care Management/Social Workerr. Management plans discussed with the patient, family and they are in agreement.   TOTAL TIME TAKING CARE OF THIS PATIENT: 45 minutes of critical care time spent.       Dustin Flock M.D on 10/10/2014 at 12:38 PM  Between 7am to 6pm - Pager - [phone] After 6pm go to [email or web address] - password [email or web address] [facility]  Office  [phone]  CC: Primary care physician; CAMPBELL, Creola Corn, MD

## 2014-10-11 DIAGNOSIS — A414 Sepsis due to anaerobes: Secondary | ICD-10-CM | POA: Diagnosis not present

## 2014-10-11 LAB — CBC
HCT: 30.5 % — ABNORMAL LOW (ref 35.0–47.0)
Hemoglobin: 10.5 g/dL — ABNORMAL LOW (ref 12.0–16.0)
MCH: 30.2 pg (ref 26.0–34.0)
MCHC: 34.2 g/dL (ref 32.0–36.0)
MCV: 88.3 fL (ref 80.0–100.0)
PLATELETS: 173 10*3/uL (ref 150–440)
RBC: 3.46 MIL/uL — ABNORMAL LOW (ref 3.80–5.20)
RDW: 16.8 % — AB (ref 11.5–14.5)
WBC: 8.5 10*3/uL (ref 3.6–11.0)

## 2014-10-11 LAB — GLUCOSE, CAPILLARY
GLUCOSE-CAPILLARY: 120 mg/dL — AB (ref 65–99)
GLUCOSE-CAPILLARY: 139 mg/dL — AB (ref 65–99)
Glucose-Capillary: 132 mg/dL — ABNORMAL HIGH (ref 65–99)
Glucose-Capillary: 133 mg/dL — ABNORMAL HIGH (ref 65–99)
Glucose-Capillary: 142 mg/dL — ABNORMAL HIGH (ref 65–99)
Glucose-Capillary: 150 mg/dL — ABNORMAL HIGH (ref 65–99)

## 2014-10-11 LAB — BASIC METABOLIC PANEL
ANION GAP: 6 (ref 5–15)
BUN: 92 mg/dL — ABNORMAL HIGH (ref 6–20)
CALCIUM: 7.6 mg/dL — AB (ref 8.9–10.3)
CO2: 18 mmol/L — ABNORMAL LOW (ref 22–32)
Chloride: 108 mmol/L (ref 101–111)
Creatinine, Ser: 6.83 mg/dL — ABNORMAL HIGH (ref 0.44–1.00)
GFR calc Af Amer: 7 mL/min — ABNORMAL LOW (ref >60)
GFR, EST NON AFRICAN AMERICAN: 6 mL/min — AB (ref >60)
GLUCOSE: 132 mg/dL — AB (ref 65–99)
Potassium: 3.8 mmol/L (ref 3.5–5.1)
Sodium: 132 mmol/L — ABNORMAL LOW (ref 135–145)

## 2014-10-11 LAB — PROTIME-INR
INR: 1.68
Prothrombin Time: 20 seconds — ABNORMAL HIGH (ref 11.4–15.0)

## 2014-10-11 LAB — MAGNESIUM: MAGNESIUM: 1.8 mg/dL (ref 1.7–2.4)

## 2014-10-11 MED ORDER — MORPHINE SULFATE (PF) 2 MG/ML IV SOLN
1.0000 mg | INTRAVENOUS | Status: DC | PRN
  Administered 2014-10-11 – 2014-10-12 (×4): 1 mg via INTRAVENOUS
  Filled 2014-10-11 (×5): qty 1

## 2014-10-11 MED ORDER — DEXTROSE-NACL 5-0.45 % IV SOLN
INTRAVENOUS | Status: DC
  Administered 2014-10-11: 19:00:00 via INTRAVENOUS

## 2014-10-11 MED ORDER — LORAZEPAM 2 MG/ML IJ SOLN: 0.5000 mg | INTRAMUSCULAR | Status: DC | PRN

## 2014-10-11 MED ORDER — HEPARIN SODIUM (PORCINE) 5000 UNIT/ML IJ SOLN
5000.0000 [IU] | Freq: Three times a day (TID) | INTRAMUSCULAR | Status: DC
Start: 2014-10-11 — End: 2014-10-13
  Administered 2014-10-11 – 2014-10-12 (×4): 5000 [IU] via SUBCUTANEOUS
  Filled 2014-10-11 (×4): qty 1

## 2014-10-11 MED ORDER — SODIUM CHLORIDE 0.9 % IV BOLUS (SEPSIS)
1000.0000 mL | Freq: Once | INTRAVENOUS | Status: AC
  Administered 2014-10-11: 1000 mL via INTRAVENOUS

## 2014-10-11 MED ORDER — MORPHINE SULFATE (CONCENTRATE) 10 MG/0.5ML PO SOLN: 5.0000 mg | ORAL | Status: DC | PRN

## 2014-10-11 MED ORDER — MORPHINE SULFATE (PF) 4 MG/ML IV SOLN: 3.0000 mg | INTRAVENOUS | Status: DC | PRN

## 2014-10-11 MED ORDER — NOREPINEPHRINE 4 MG/250ML-% IV SOLN
0.0000 ug/min | INTRAVENOUS | Status: DC
  Administered 2014-10-11: 15 ug/min via INTRAVENOUS
  Administered 2014-10-11 (×2): 10 ug/min via INTRAVENOUS
  Administered 2014-10-11 (×3): 15 ug/min via INTRAVENOUS
  Administered 2014-10-12: 13 ug/min via INTRAVENOUS
  Administered 2014-10-12 (×2): 30.133 ug/min via INTRAVENOUS
  Administered 2014-10-12: 13 ug/min via INTRAVENOUS
  Administered 2014-10-12: 30.133 ug/min via INTRAVENOUS
  Filled 2014-10-11 (×10): qty 250

## 2014-10-11 MED ORDER — VANCOMYCIN 50 MG/ML ORAL SOLUTION
500.0000 mg | Freq: Four times a day (QID) | ORAL | Status: DC
  Administered 2014-10-11 – 2014-10-12 (×2): 500 mg via ORAL
  Filled 2014-10-11 (×11): qty 10

## 2014-10-11 MED ORDER — GLYCOPYRROLATE 0.2 MG/ML IJ SOLN
0.4000 mg | Freq: Four times a day (QID) | INTRAMUSCULAR | Status: DC | PRN
  Administered 2014-10-12: 0.4 mg via INTRAVENOUS
  Filled 2014-10-11: qty 2

## 2014-10-11 MED ORDER — NOREPINEPHRINE 4 MG/250ML-% IV SOLN
INTRAVENOUS | Status: AC
  Administered 2014-10-11: 10 ug/min via INTRAVENOUS
  Filled 2014-10-11: qty 250

## 2014-10-11 NOTE — Progress Notes (Signed)
[facility] NAME: Jessica Duran    MR#:  [id number]  DATE OF BIRTH:  1955-03-31  SUBJECTIVE:  Patient on 15 mics of Levophed for low blood pressure temperature still low.  REVIEW OF SYSTEMS:  Unobtainable as the patient is  with altered mentation  DRUG ALLERGIES:   Allergies  Allergen Reactions  . Acetaminophen Other (See Comments)    Other reaction(s): Unknown Reaction:  Unknown   . Hydralazine Other (See Comments)    Reaction:  Unknown   . Isosorbide Other (See Comments)    Reaction:  Unknown   . Levaquin [Levofloxacin In D5w] Other (See Comments)    Reaction:  Unknown   . Lisinopril Other (See Comments)    Reaction:  Caused pt to have a stroke.    Marland Kitchen Oxycodone Other (See Comments)    Reaction:  Unknown   . Oxycodone-Acetaminophen     Other reaction(s): Unknown Other reaction(s): Unknown  . Penicillins Other (See Comments)    Reaction:  Unknown     VITALS:  Blood pressure 93/68, pulse 90, temperature 97.2 F (36.2 C), temperature source Rectal, resp. rate 19, height 5\' 4"  (1.626 m), weight 53.706 kg (118 lb 6.4 oz), SpO2 100 %.  PHYSICAL EXAMINATION:  GENERAL:  59 y.o.-year-old patient lying in the bed  critically ill EYES: Pupils equal, round, reactive to light and accommodation. No scleral icterus. HEENT: Head atraumatic, normocephalic. Oropharynx and nasopharynx clear.  NECK:  Supple, no jugular venous distention. No thyroid enlargement, no tenderness.  LUNGS: Coarse breath sounds bilaterally with some necessary muscle usage  CARDIOVASCULAR: S1, S2 normal. No murmurs, rubs, or gallops.  ABDOMEN: Soft, diffusely tender, nondistended. Bowel sounds present EXTREMITIES: No pedal edema, cyanosis, or clubbing.  NEUROLOGIC: Patient is with altered mental status.  PSYCHIATRIC: The patient is with altered mental status SKIN: No obvious rash, lesion, or ulcer.    LABORATORY PANEL:   CBC  Recent Labs Lab  10/11/14 0454  WBC 8.5  HGB 10.5*  HCT 30.5*  PLT 173   ------------------------------------------------------------------------------------------------------------------  Chemistries   Recent Labs Lab 10/13/2014 1233  10/11/14 0454  NA 133*  < > 132*  K 3.0*  < > 3.8  CL 103  < > 108  CO2 16*  < > 18*  GLUCOSE 157*  < > 132*  BUN 89*  < > 92*  CREATININE 6.99*  < > 6.83*  CALCIUM 7.6*  < > 7.6*  MG  --   < > 1.8  AST 25  --   --   ALT 8*  --   --   ALKPHOS 78  --   --   BILITOT 1.0  --   --   < > = values in this interval not displayed. ------------------------------------------------------------------------------------------------------------------  Cardiac Enzymes  Recent Labs Lab 10/11/2014 1515  TROPONINI <0.03   ------------------------------------------------------------------------------------------------------------------  RADIOLOGY:  Dg Chest 1 View  10/20/2014   CLINICAL DATA:  RIGHT pleural effusion post thoracentesis, hypertension, stroke, CHF, breast cancer, former smoker, diabetes mellitus  EXAM: CHEST  1 VIEW  COMPARISON:  10/18/2014  FINDINGS: RIGHT jugular Port-A-Cath with tip projecting over SVC.  Upper normal size of cardiac silhouette.  Atherosclerotic calcification aorta.  Marked decrease in RIGHT pleural effusion post thoracentesis.  Near complete resolution of previously identified RIGHT basilar atelectasis.  No pulmonary infiltrate, pleural effusion or pneumothorax.  Bones demineralized.  IMPRESSION: Markedly improved aeration in lower RIGHT lung post thoracentesis.  No pneumothorax.  Electronically Signed   By: Lavonia Dana M.D.   On: 10/24/2014 15:00   US Thoracentesis Asp Pleural Space W/img Guide  09/26/2014   CLINICAL DATA:  59 year old female with mid toe Hospital. Right pleural effusion. She has been referred for image guided thoracentesis  EXAM: ULTRASOUND GUIDED RIGHT THORACENTESIS  COMPARISON:  CT 09/28/2014  PROCEDURE: An ultrasound guided  thoracentesis was thoroughly discussed with the patient and questions answered. The benefits, risks, alternatives and complications were also discussed. The patient understands and wishes to proceed with the procedure. Written consent was obtained.  Ultrasound was performed to localize and mark an adequate pocket of fluid in the right chest. The area was then prepped and draped in the normal sterile fashion. 1% Lidocaine was used for local anesthesia. Under ultrasound guidance a Safe-T-Centesis catheter was introduced. Thoracentesis was performed. The catheter was removed and a dressing applied.  Complications:  COMPLICATIONS: None  FINDINGS: A total of approximately 1.2 L of amber thin fluid was removed. A fluid sample wassent for laboratory analysis.  IMPRESSION: Status post ultrasound-guided right-sided thoracentesis. 1.2 L of amber colored thin fluid removed.  Signed,  Dulcy Fanny. Earleen Newport, DO  [facility]  [facility]   Electronically Signed   By: Corrie Mckusick D.O.   On: 10/23/2014 15:20    EKG:   Orders placed or performed during the hospital encounter of 10/18/2014  . ED EKG  . 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 Severe sepsis due to C. difficile colitis, continue IV Flagyl increased dose of vancomycin For hypotension and hypothermia continue Levophed and warming measures  2. Moderate to large right-sided pleural effusion may be underlying pneumonia Status post thoracentesis 3. Diabetes 2 Sliding scale glipizide on hold  4. Vascular accesess: HD catheter per intensivists based on what patient's family decides regarding comfort care or not  5. Chronic AC-  Ac on hold due to  elevated INR  6. ESRD   appreciate nephorlogy consult for hd  7. Hypokalemia  replace per nephrology   8.coagulopathy  Improved with vit k, All the  records are reviewed and case discussed with Care Management/Social Workerr.  Prognosis very poor in this chronically ill female she is a DO NOT RESUSCITATE discussions are being held regarding possible comfort care  TOTAL TIME TAKING CARE OF THIS PATIENT: 35 minutes of critical care time     Dustin Flock M.D on 10/11/2014 at 1:34 PM  Between 7am to 6pm - Pager - [phone] After 6pm go to [email or web address] - password [email or web address] [facility]  Office  [phone]  CC: Primary care physician; CAMPBELL, Creola Corn, MD

## 2014-10-11 NOTE — Progress Notes (Signed)
Subjective:   Patient was sent from the dialysis unit for hypotension, lethargy, low hemoglobin and desaturation. She was transferred to ICU overnight for hypotension and placed on levophed WBC count is improving She continues to have loose stools and has rectal tube now Today, she remains somewhat confused. Not able to answer questions. Appears restless She is very weak  potassium level has imrpoved CT of the abdomen shows diffuse inflammation in the colon  Objective:  Vital signs in last 24 hours:  Temp:  [90.9 F (32.7 C)-96.8 F (36 C)] 96.8 F (36 C) (09/16 0800) Pulse Rate:  [69-93] 76 (09/16 0800) Resp:  [12-26] 21 (09/16 0800) BP: (39-96)/(23-74) 89/74 mmHg (09/16 0800) SpO2:  [2 %-100 %] 100 % (09/16 0800)  Weight change:  Filed Weights   10/24/2014 1408 10/19/2014 2141  Weight: 61.689 kg (136 lb) 53.706 kg (118 lb 6.4 oz)    Intake/Output:    Intake/Output Summary (Last 24 hours) at 10/11/14 0840 Last data filed at 10/11/14 0700  Gross per 24 hour  Intake 2182.91 ml  Output      0 ml  Net 2182.91 ml     Physical Exam: General:  ill appearing, laying in the bed  HEENT  edentulous, dry oral membranes  Neck  supple  Pulm/lungs  mild diffuse rhonchi  CVS/Heart  irregular, no rub or gallop  Abdomen:   soft, mild diffuse tenderness  Extremities:  trace peripheral edema  Neurologic:  lethargic but arousable  Skin:  no acute rashes  Access:  AVG appears clotted       Basic Metabolic Panel:   Recent Labs Lab 10/20/2014 1609 10/04/2014 0419 10/17/2014 1233 10/10/14 0513 10/10/14 1436 10/11/14 0454  NA 137 134* 133* 134*  --  132*  K 3.0* 2.7* 3.0* 2.7*  2.8* 3.4* 3.8  CL 104 103 103 105  --  108  CO2 18* 19* 16* 17*  --  18*  GLUCOSE 102* 152* 157* 115*  --  132*  BUN 84* 84* 89* 92*  --  92*  CREATININE 7.30* 7.00* 6.99* 7.10*  --  6.83*  CALCIUM 8.4* 7.7* 7.6* 7.7*  --  7.6*  MG  --   --   --  1.9  --  1.8  PHOS  --   --   --   --  5.5*  --       CBC:  Recent Labs Lab 10/18/2014 1609 10/11/2014 0419 10/10/14 0513 10/11/14 0454  WBC 25.0* 20.2* 15.3* 8.5  NEUTROABS 23.4*  --   --   --   HGB 9.3* 8.5* 9.5* 10.5*  HCT 28.5* 25.7* 28.4* 30.5*  MCV 88.7 88.0 87.0 88.3  PLT 184 174 184 173      Microbiology:  Recent Results (from the past 720 hour(s))  Blood culture (routine x 2)     Status: None (Preliminary result)   Collection Time: 09/29/2014  5:54 PM  Result Value Ref Range Status   Specimen Description BLOOD RIGHT ARM  Final   Special Requests BOTTLES DRAWN AEROBIC AND ANAEROBIC  3CC  Final   Culture NO GROWTH 3 DAYS  Final   Report Status PENDING  Incomplete  Blood culture (routine x 2)     Status: None (Preliminary result)   Collection Time: 10/07/2014  5:54 PM  Result Value Ref Range Status   Specimen Description BLOOD PORTA CATH  Final   Special Requests BOTTLES DRAWN AEROBIC AND ANAEROBIC  2CC  Final   Culture NO  GROWTH 3 DAYS  Final   Report Status PENDING  Incomplete  MRSA PCR Screening     Status: Abnormal   Collection Time: 10/11/2014 12:17 AM  Result Value Ref Range Status   MRSA by PCR POSITIVE (A) NEGATIVE Final    Comment:        The GeneXpert MRSA Assay (FDA approved for NASAL specimens only), is one component of a comprehensive MRSA colonization surveillance program. It is not intended to diagnose MRSA infection nor to guide or monitor treatment for MRSA infections. CRITICAL RESULT CALLED TO, READ BACK BY AND VERIFIED WITH: AMY TIPPETT RN   C difficile quick scan w PCR reflex     Status: Abnormal   Collection Time: 09/28/2014  1:07 PM  Result Value Ref Range Status   C Diff antigen POSITIVE (A) NEGATIVE Final   C Diff toxin POSITIVE (A) NEGATIVE Final   C Diff interpretation   Final    Positive for toxigenic C. difficile, active toxin production present.    Comment: Positive for toxigenic C. difficile, active toxin production present. LINDSEY TRIPP 09/30/2014 AT 1413 VKB   Body fluid  culture     Status: None (Preliminary result)   Collection Time: 10/07/2014  2:10 PM  Result Value Ref Range Status   Specimen Description PLEURAL  Final   Special Requests NONE  Final   Gram Stain FEW WBC SEEN NO ORGANISMS SEEN   Final   Culture NO GROWTH < 24 HOURS  Final   Report Status PENDING  Incomplete    Coagulation Studies:  Recent Labs  10/15/2014 1233 10/10/14 0513 10/11/14 0454  LABPROT 66.0* 32.2* 20.0*  INR 7.94* 3.13 1.68    Urinalysis: No results for input(s): COLORURINE, LABSPEC, PHURINE, GLUCOSEU, HGBUR, BILIRUBINUR, KETONESUR, PROTEINUR, UROBILINOGEN, NITRITE, LEUKOCYTESUR in the last 72 hours.  Invalid input(s): APPERANCEUR    Imaging: Dg Chest 1 View  10/14/2014   CLINICAL DATA:  RIGHT pleural effusion post thoracentesis, hypertension, stroke, CHF, breast cancer, former smoker, diabetes mellitus  EXAM: CHEST  1 VIEW  COMPARISON:  10/07/2014  FINDINGS: RIGHT jugular Port-A-Cath with tip projecting over SVC.  Upper normal size of cardiac silhouette.  Atherosclerotic calcification aorta.  Marked decrease in RIGHT pleural effusion post thoracentesis.  Near complete resolution of previously identified RIGHT basilar atelectasis.  No pulmonary infiltrate, pleural effusion or pneumothorax.  Bones demineralized.  IMPRESSION: Markedly improved aeration in lower RIGHT lung post thoracentesis.  No pneumothorax.   Electronically Signed   By: Lavonia Dana M.D.   On: 10/11/2014 15:00   US Thoracentesis Asp Pleural Space W/img Guide  10/23/2014   CLINICAL DATA:  59 year old female with mid toe Hospital. Right pleural effusion. She has been referred for image guided thoracentesis  EXAM: ULTRASOUND GUIDED RIGHT THORACENTESIS  COMPARISON:  CT 09/30/2014  PROCEDURE: An ultrasound guided thoracentesis was thoroughly discussed with the patient and questions answered. The benefits, risks, alternatives and complications were also discussed. The patient understands and wishes to proceed with  the procedure. Written consent was obtained.  Ultrasound was performed to localize and mark an adequate pocket of fluid in the right chest. The area was then prepped and draped in the normal sterile fashion. 1% Lidocaine was used for local anesthesia. Under ultrasound guidance a Safe-T-Centesis catheter was introduced. Thoracentesis was performed. The catheter was removed and a dressing applied.  Complications:  COMPLICATIONS: None  FINDINGS: A total of approximately 1.2 L of amber thin fluid was removed. A fluid sample wassent for laboratory analysis.  IMPRESSION: Status post ultrasound-guided right-sided thoracentesis. 1.2 L of amber colored thin fluid removed.  Signed,  Dulcy Fanny. Earleen Newport, DO  [facility]  [facility]   Electronically Signed   By: Corrie Mckusick D.O.   On: 10/19/2014 15:20     Medications:   . sodium chloride    . dextrose 5 % and 0.45 % NaCl with KCl 20 mEq/L 50 mL/hr at 10/11/14 0700  . norepinephrine 15 mcg/min (10/11/14 0820)   . antiseptic oral rinse  7 mL Mouth Rinse BID  . atorvastatin  20 mg Oral QHS  . Chlorhexidine Gluconate Cloth  6 each Topical Q0600  . clopidogrel  75 mg Oral Daily  . insulin aspart  0-24 Units Subcutaneous 6 times per day  . metronidazole  500 mg Intravenous Q8H  . multivitamin  1 tablet Oral Daily  . mupirocin ointment  1 application Nasal BID  . pneumococcal 23 valent vaccine  0.5 mL Intramuscular Tomorrow-1000  . vancomycin  250 mg Oral 4 times per day   acetaminophen **OR** acetaminophen, bisacodyl, bismuth subsalicylate, diphenhydrAMINE, fluticasone, iohexol, meclizine, nitroGLYCERIN, ondansetron **OR** ondansetron (ZOFRAN) IV, promethazine, trolamine salicylate  Assessment/ Plan:  59 y.o. female With end-stage renal disease, history of stroke, hypertension, diastolic congestive heart failure, DVT, hyperparathyroidism, breast cancer status post radiation and chemotherapy  1. ESRD. [facility] [employer] [facility] dialysis. T-T-S - Patient has missed several treatments due to weakness and her illness - Her last dialysis was last Tuesday -  volume status appears to be that she is dehydrated. She is getting IV fluid supplementation -  Would like to dialyze today after Access is available.  2. C diff colitis causing sepsis - v fluid bolus - NS x 1 liter positive C. difficile colitis Colitis also seen by CT abdomen  3. Anemia of chronic kidney disease - No EPO because of recent breast cancer  4. Secondary hyperparathyroidism - Monitor phosphorus during this admission  5. Patient's clinical condition has declined significantly over the last few months. Consider palliative care consultation - discussed with boyfriend - Sister in [place] is POA 6. Hypokalemia - continue potassium with IV fluids    LOS: 3 Shany Marinez 9/16/20168:40 AM

## 2014-10-11 NOTE — Progress Notes (Signed)
Jessica Duran Date of Admission:  10/20/2014     ID: TAYLORANNE Jessica Duran is a 59 y.o. female with C diff colitis Active Problems:   Hypotension   Pressure ulcer   Severe sepsis with septic shock   Subjective: Transferred to ICU - more obtunded, not taking po. Has rectoseal  ROS  Eleven systems are reviewed and negative except per hpi  Medications:  Antibiotics Given (last 72 hours)    Date/Time Action Medication Dose Rate   10/03/2014 0259 Given   aztreonam (AZACTAM) 500 mg in dextrose 5 % 50 mL IVPB 500 mg 100 mL/hr   10/18/2014 2131 Given   vancomycin (VANCOCIN) 50 mg/mL oral solution 250 mg 250 mg    10/10/14 0039 Given   vancomycin (VANCOCIN) 50 mg/mL oral solution 250 mg 250 mg    10/10/14 0521 Given   vancomycin (VANCOCIN) 50 mg/mL oral solution 250 mg 250 mg    10/10/14 1139 Given   vancomycin (VANCOCIN) 50 mg/mL oral solution 250 mg 250 mg    10/10/14 1622 Given   metroNIDAZOLE (FLAGYL) IVPB 500 mg 500 mg 100 mL/hr   10/10/14 1800 Given   vancomycin (VANCOCIN) 50 mg/mL oral solution 250 mg 250 mg    10/10/14 2201 Given   metroNIDAZOLE (FLAGYL) IVPB 500 mg 500 mg 100 mL/hr   10/11/14 0037 Given   vancomycin (VANCOCIN) 50 mg/mL oral solution 250 mg 250 mg    10/11/14 0545 Given   vancomycin (VANCOCIN) 50 mg/mL oral solution 250 mg 250 mg    10/11/14 0545 Given   metroNIDAZOLE (FLAGYL) IVPB 500 mg 500 mg 100 mL/hr   10/11/14 1341 Given   metroNIDAZOLE (FLAGYL) IVPB 500 mg 500 mg 100 mL/hr     . antiseptic oral rinse  7 mL Mouth Rinse BID  . atorvastatin  20 mg Oral QHS  . Chlorhexidine Gluconate Cloth  6 each Topical Q0600  . clopidogrel  75 mg Oral Daily  . heparin subcutaneous  5,000 Units Subcutaneous 3 times per day  . insulin aspart  0-24 Units Subcutaneous 6 times per day  . metronidazole  500 mg Intravenous Q8H  . multivitamin  1 tablet Oral Daily  . mupirocin ointment  1 application Nasal BID  . vancomycin  500 mg Oral 4  times per day    Objective: Vital signs in last 24 hours: Temp:  [94.3 F (34.6 C)-97.2 F (36.2 C)] 97.2 F (36.2 C) (09/16 1100) Pulse Rate:  [75-96] 90 (09/16 1100) Resp:  [14-26] 19 (09/16 1100) BP: (39-96)/(23-74) 93/68 mmHg (09/16 1100) SpO2:  [98 %-100 %] 100 % (09/16 1100)  Lab Results  Recent Labs  10/10/14 0513 10/10/14 1436 10/11/14 0454  WBC 15.3*  --  8.5  HGB 9.5*  --  10.5*  HCT 28.4*  --  30.5*  NA 134*  --  132*  K 2.7*  2.8* 3.4* 3.8  CL 105  --  108  CO2 17*  --  18*  BUN 92*  --  92*  CREATININE 7.10*  --  6.83*    Microbiology: Results for orders placed or performed during the hospital encounter of 10/20/2014  Blood culture (routine x 2)     Status: None (Preliminary result)   Collection Time: 10/17/2014  5:54 PM  Result Value Ref Range Status   Specimen Description BLOOD RIGHT ARM  Final   Special Requests BOTTLES DRAWN AEROBIC AND ANAEROBIC  3CC  Final   Culture NO GROWTH 3  DAYS  Final   Report Status PENDING  Incomplete  Blood culture (routine x 2)     Status: None (Preliminary result)   Collection Time: 10/11/2014  5:54 PM  Result Value Ref Range Status   Specimen Description BLOOD PORTA CATH  Final   Special Requests BOTTLES DRAWN AEROBIC AND ANAEROBIC  2CC  Final   Culture NO GROWTH 3 DAYS  Final   Report Status PENDING  Incomplete  MRSA PCR Screening     Status: Abnormal   Collection Time: 10/24/2014 12:17 AM  Result Value Ref Range Status   MRSA by PCR POSITIVE (A) NEGATIVE Final    Comment:        The GeneXpert MRSA Assay (FDA approved for NASAL specimens only), is one component of a comprehensive MRSA colonization surveillance program. It is not intended to diagnose MRSA infection nor to guide or monitor treatment for MRSA infections. CRITICAL RESULT CALLED TO, READ BACK BY AND VERIFIED WITH: AMY TIPPETT RN   C difficile quick scan w PCR reflex     Status: Abnormal   Collection Time: 10/03/2014  1:07 PM  Result Value Ref Range  Status   C Diff antigen POSITIVE (A) NEGATIVE Final   C Diff toxin POSITIVE (A) NEGATIVE Final   C Diff interpretation   Final    Positive for toxigenic C. difficile, active toxin production present.    Comment: Positive for toxigenic C. difficile, active toxin production present. LINDSEY TRIPP 10/13/2014 AT 3 VKB   Body fluid culture     Status: None (Preliminary result)   Collection Time: 10/08/2014  2:10 PM  Result Value Ref Range Status   Specimen Description PLEURAL  Final   Special Requests NONE  Final   Gram Stain FEW WBC SEEN NO ORGANISMS SEEN   Final   Culture NO GROWTH 2 DAYS  Final   Report Status PENDING  Incomplete    Studies/Results: Dg Chest 1 View  09/29/2014   CLINICAL DATA:  RIGHT pleural effusion post thoracentesis, hypertension, stroke, CHF, breast cancer, former smoker, diabetes mellitus  EXAM: CHEST  1 VIEW  COMPARISON:  09/29/2014  FINDINGS: RIGHT jugular Port-A-Cath with tip projecting over SVC.  Upper normal size of cardiac silhouette.  Atherosclerotic calcification aorta.  Marked decrease in RIGHT pleural effusion post thoracentesis.  Near complete resolution of previously identified RIGHT basilar atelectasis.  No pulmonary infiltrate, pleural effusion or pneumothorax.  Bones demineralized.  IMPRESSION: Markedly improved aeration in lower RIGHT lung post thoracentesis.  No pneumothorax.   Electronically Signed   By: Lavonia Dana M.D.   On: 10/18/2014 15:00   US Thoracentesis Asp Pleural Space W/img Guide  10/08/2014   CLINICAL DATA:  59 year old female with mid toe Hospital. Right pleural effusion. She has been referred for image guided thoracentesis  EXAM: ULTRASOUND GUIDED RIGHT THORACENTESIS  COMPARISON:  CT 10/18/2014  PROCEDURE: An ultrasound guided thoracentesis was thoroughly discussed with the patient and questions answered. The benefits, risks, alternatives and complications were also discussed. The patient understands and wishes to proceed with the  procedure. Written consent was obtained.  Ultrasound was performed to localize and mark an adequate pocket of fluid in the right chest. The area was then prepped and draped in the normal sterile fashion. 1% Lidocaine was used for local anesthesia. Under ultrasound guidance a Safe-T-Centesis catheter was introduced. Thoracentesis was performed. The catheter was removed and a dressing applied.  Complications:  COMPLICATIONS: None  FINDINGS: A total of approximately 1.2 L of amber thin  fluid was removed. A fluid sample wassent for laboratory analysis.  IMPRESSION: Status post ultrasound-guided right-sided thoracentesis. 1.2 L of amber colored thin fluid removed.  Signed,  Dulcy Fanny. Earleen Newport, DO  [facility]  [facility]   Electronically Signed   By: Corrie Mckusick D.O.   On: 10/08/2014 15:20    Assessment/Plan: BABYGIRL TRAGER is a 59 y.o. female with ESRD on HD admitted with hypotension, lethargy, diarrhea, leukocytosis and poor po intake. Had been on abx as otpt. Had large effusion tapped with neg culture. C diff is +. Her CT abd showed colitis. I suspect this is all C diff, doubt she has pna. Bcx are pending. No evidence of AVF infection. BCX and pleural culture negative.   Recommendations Continue oral vanco for severe C diff IV flagyl continued as well  Thank you very much for the consult. Will follow with you.  [place], [place]   10/11/2014, 2:50 PM

## 2014-10-11 NOTE — Consult Note (Addendum)
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  Spouse at bedside, stated this injury occurred one week before coming to hospital.  Left upper arm dialysis shunt in place. Some bruising noted. Right arm edematous, no further intervention needed.  Elevated on pillow.  Pressure Ulcer POA: Yes Measurement: 1 cm x 0.5 cm x 0.2 cm Wound UDJ:[id number] white devitalized tissue in center.  Otherwise, pale pink and nongranulating Drainage (amount, consistency, odor) Minimal serosanguinous drainage.  Periwound:Intact Dressing procedure/placement/frequency:Allevyn silicone bordered foam dressing.  Change every 3 days and PRN soilage.  Will not follow at this time.  Please re-consult if needed.  Domenic Moras RN BSN [place] Pager [phone]

## 2014-10-11 NOTE — Progress Notes (Signed)
ID E - Note Afebrile, WBC decreasing. Prien neg and pleural fluid cx neg  I suspect her acute illness is due to C diff Continue to hold systemic antibitoics Continue oral vanco for C diff- 14 day course

## 2014-10-11 NOTE — Progress Notes (Signed)
Nutrition Follow-up    INTERVENTION:   Coordination of Care: nutritional poc was discussed during ICU rounds as pt remains NPO, per MD Simonds, plan to await further decision regarding overall poc (aggressive vs comfort measures), reassess on follow-up   NUTRITION DIAGNOSIS:   Inadequate oral intake related to acute illness as evidenced by NPO status. Continues  GOAL:   Patient will meet greater than or equal to 90% of their needs  MONITOR:    (Energy Intake, Anthropometrics, Electrolyte/Renal Profile, Glucose Profile, Digestive System)  REASON FOR ASSESSMENT:   Diagnosis (ESRD)    ASSESSMENT:    Pt transferred to ICU, on levophed drip, hypothermic on warming blanket, palliative care following, possible trialysis placement for initiation of HD but further determination based on poc; pt alert, moaning this AM  Diet Order:  Diet NPO time specified Except for: Sips with Meds day 3; pt did tolerate sips of water this AM and able to take oral meds  Skin:   (stage III coccyx)  Last BM:  diarrhea, Cdiff positive; rectal tube in place  Electrolyte and Renal Profile:  Recent Labs Lab 09/27/2014 1233 10/10/14 0513 10/10/14 1436 10/11/14 0454  BUN 89* 92*  --  92*  CREATININE 6.99* 7.10*  --  6.83*  NA 133* 134*  --  132*  K 3.0* 2.7*  2.8* 3.4* 3.8  MG  --  1.9  --  1.8  PHOS  --   --  5.5*  --    Glucose Profile:  Recent Labs  10/11/14 0030 10/11/14 0354 10/11/14 0734  GLUCAP 132* 133* 120*   Nutritional Anemia Profile:  CBC Latest Ref Rng 10/11/2014 10/10/2014 09/26/2014  WBC 3.6 - 11.0 K/uL 8.5 15.3(H) 20.2(H)  Hemoglobin 12.0 - 16.0 g/dL 10.5(L) 9.5(L) 8.5(L)  Hematocrit 35.0 - 47.0 % 30.5(L) 28.4(L) 25.7(L)  Platelets 150 - 440 K/uL 173 184 174   Meds: D5-1/2 NS with KCl at 50 ml/hr, ss novolog, levophed  Height:   Ht Readings from Last 1 Encounters:  10/10/2014 5\' 4"  (1.626 m)    Weight:   Wt Readings from Last 1 Encounters:  10/02/2014 118 lb 6.4 oz  (53.706 kg)   Filed Weights   10/07/2014 1408 10/09/2014 2141  Weight: 136 lb (61.689 kg) 118 lb 6.4 oz (53.706 kg)    BMI:  Body mass index is 20.31 kg/(m^2).  Estimated Nutritional Needs:   Kcal:  1580-1842 kcals (BEE 1097, 1.2 AF, 1.2-1.4 IF) using IBW 54 kg  Protein:  81-108 g (1.5-2.0 g/kg)   Fluid:  1000 mL plus UOP  HIGH Care Level  Kerman Passey MS, RD, LDN [phone] Pager

## 2014-10-11 NOTE — Progress Notes (Signed)
Palliative Medicine Inpatient Consult Follow Up Note   Name: Jessica Duran Date: 10/11/2014 MRN: [id number]  DOB: 1956/01/15   IMPORTANT NOTE:  Pt has recently been seen as an outpt by Jessica Hires, FNP.  I am asked to do a Palliative Care consult and I am Jessica Can, MD.  We are not the same person. I mention this to try to help diminish the degree of confusion about this matter (as it has come up before).   Referring Physician: Dustin Flock, MD  Palliative Care consult requested for this 59 y.o. female for goals of medical therapy in patient with sepsis due to severe C Diff colitis and problems as detailed below.  IMPRESSION: 1. Sepsis -due to C DIFF colitis (severe sepsis with shock) ---with altered mental status  ---worsening and on levo 2. ESRD on HD ---talked with Dr. Candiss Duran  --no HD now (too unstable to tolerate) 3. 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 Kirt Duran who saw her as recently as 07/31/14 ---also treated with adjuvant Cytoxan and Adriamycin and Taxol ( by Dr Jessica Duran) 4. DM2 --with diabetic retinopathy 5. Vascular access problems --was to get Fhuntogram (declotting?) but ended up here so that procedure was cancelled. 7. Moderate to large right sided pleural effusion (same side as breast cancer) ---cytology pending 8. Fibromyalgia  9. Electrolyte dissarray --being treated  10. Coagulopathy --was given Vit K (INR was 7.94) --not bleeding 11. History of substance abuse (cocaine)  12. H/O CVA times FOUR (1997, 2004, and 2013, and Feb 2016) -- first three due to or related to crack cocaine use ---She was started on Eliquis 2.5 mg bid and a statin along with Plavix for CVA in Feb 2016 ---CVA Feb 2016 was located in left middle cerebellar peduncle and  this left her with some left sided weakness, dizziness, and diplopia (for which an alternating eye patch was RXd and for which she was admitted to a SNF for rehab and has since continued on as a long term care patient).  13. Secondary Hyperparathyroidism 14. Chronic compensated Diastolic CHF (nl EF) 15. H/O DVT 16. Insomnia 17. Depression and anxiety with h/o past suicide ideation and psychiatric stay at [facility]. 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 Noncompliance with dialysis sessions --she misses it often 20. Tobacco Use 21. Hyponatremia  DISCUSSIONS, DECISIONS, and PLANS:  I have spent many hours trying to establish a primary decision maker for this pt.  I have, at last, accomplished this, while following laws in place to be sure this is done appropriately.  The three daughters have all deferred their right to be the primary decision makers.  They defer this role to pts two sisters.  The sisters are present this evening (I stayed late to meet them).    They have agreed that Jessica Duran will be the first contact and Jessica Duran will be the second contact.   I have been in meetings all day off and on with [facility], the boyfriend, and with the sisters.  I have talked on the phone to one daughter several times Jessica Duran) and one other daughter once Jessica Duran).    Jessica Duran tells me that the patient abandoned them when they were very little and she disappeared for 13 years.  They then learned she was alive and tried to make it work by moving pt to  [place] to live near all three daughters.  Apparently, pt went back to using crack cocaine and could not be trusted. She broke their trust and that attempt at reconciliation ended some years ago.  The daughters were raised by an aunt who was 72 at the time of abandonment.    At the end of the day, patient is looking like she is struggling to breathe and morphine at various doses is ordered along with several other comfort oriented medications.   We are not at the point of total comfort care, but we are likely moving in that direction.  Pt is not, at this point, expected to survive, but family wishes for [place] to try to cure the C Diff at least for another couple of days .  Her prognosis is extremely grim.  She is DNR and DNI and No feeding tube.  I have decided she is not even stable enough for an NG tube, given that she is struggling to breathe some now.  Sisters are aware pt may not live through the night.  I have updated Dr. Mortimer Fries, critical care, and nurse also.      REVIEW OF SYSTEMS:  Patient is not able to provide ROS due to critical illness  CODE STATUS: DNR , DNI, No feeding tubes  HD allowed if she tolerates it and if BP is adequate and if its not futile per family   PAST MEDICAL HISTORY: Past Medical History  Diagnosis Date  . History of stroke   . Hypertension   . Chronic kidney disease     stage III  . Diastolic congestive heart failure     ef 55-60%  . Fibromyalgia   . DVT (deep venous thrombosis)   . Hyperparathyroidism   . Stroke   . Diabetes mellitus without complication   . 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 Renal insufficiency     PAST SURGICAL HISTORY:  Past Surgical History  Procedure Laterality Date  . Appendectomy    . Cesarean section      x3  . Breast lumpectomy Right may 2015  . Breast biopsy Right     03/27/2013 positive  . Status post radiation Left     breast cancer  . Status post chemo Left     breast cancer    Vital Signs: BP 93/68 mmHg  Pulse 90  Temp(Src) 97.2 F (36.2 C) (Rectal)  Resp 19  Ht 5\' 4"  (1.626 m)  Wt 53.706 kg (118 lb 6.4 oz)  BMI 20.31 kg/m2  SpO2 100% Filed Weights   10/10/2014 1408 10/02/2014 2141  Weight: 61.689 kg (136 lb) 53.706 kg (118 lb 6.4 oz)    Estimated body mass index is 20.31 kg/(m^2) as calculated from the following:   Height as of this encounter: 5\' 4"  (1.626 m).   Weight as of this encounter: 53.706 kg (118 lb 6.4 oz).  PHYSICAL  EXAM: Over the day she has become more dyspneic And she is moaning intermittently in pain Unable to wear her eye patch due to reslessness Often has eyes open but is not following or responding to commands and has tongue out Pupils are not entirely equal/conjugate (but believe this is old from stroke in February ??) Neck w/o JVD or TM Heart rrr with occas irreg beats Lungs with ronchi bases  Abd soft and with scant BS Ext no mottling noted --she moves all   LABS: CBC:    Component Value Date/Time   WBC  8.5 10/11/2014 0454   WBC 3.0* 05/07/2014 1059   HGB 10.5* 10/11/2014 0454   HGB 10.0* 05/07/2014 1059   HCT 30.5* 10/11/2014 0454   HCT 31.3* 05/07/2014 1059   PLT 173 10/11/2014 0454   PLT 161 05/07/2014 1059   MCV 88.3 10/11/2014 0454   MCV 86 05/07/2014 1059   NEUTROABS 23.4* 10/23/2014 1609   NEUTROABS 1.4 05/07/2014 1059   LYMPHSABS 0.7* 10/23/2014 1609   LYMPHSABS 1.0 05/07/2014 1059   MONOABS 0.9 10/05/2014 1609   MONOABS 0.4 05/07/2014 1059   EOSABS 0.0 10/10/2014 1609   EOSABS 0.2 05/07/2014 1059   BASOSABS 0.0 10/15/2014 1609   BASOSABS 0.0 05/07/2014 1059   BASOSABS 4 11/18/2013 0447   Comprehensive Metabolic Panel:    Component Value Date/Time   NA 132* 10/11/2014 0454   NA 141 02/22/2014 0906   K 3.8 10/11/2014 0454   K 3.9 02/22/2014 0906   CL 108 10/11/2014 0454   CL 104 02/22/2014 0906   CO2 18* 10/11/2014 0454   CO2 30 02/22/2014 0906   BUN 92* 10/11/2014 0454   BUN 21* 02/22/2014 0906   CREATININE 6.83* 10/11/2014 0454   CREATININE 4.10* 02/22/2014 0906   GLUCOSE 132* 10/11/2014 0454   GLUCOSE 155* 02/22/2014 0906   CALCIUM 7.6* 10/11/2014 0454   CALCIUM 8.5 02/22/2014 0906   AST 25 10/20/2014 1233   AST 24 02/22/2014 0906   ALT 8* 09/26/2014 1233   ALT 33 02/22/2014 0906   ALKPHOS 78 10/20/2014 1233   ALKPHOS 100 02/22/2014 0906   BILITOT 1.0 10/25/2014 1233   BILITOT 0.2 02/22/2014 0906   PROT 5.2* 10/22/2014 1233   PROT 6.5  02/22/2014 0906   ALBUMIN 1.9* 09/26/2014 1233   ALBUMIN 3.0* 02/22/2014 0906    See top of note for plan  More than 50% of the visit was spent in counseling/coordination of care: YES  Time Spent:  3 hours

## 2014-10-11 NOTE — Progress Notes (Signed)
[place] Progress Note Patient Name: TIHANNA GOODSON DOB: Apr 26, 1955 MRN: [id number]   Date of Service  10/11/2014  HPI/Events of Note  RN notified MD hypokalemia resolved.  eICU Interventions  Switching IVF from D5 1/2NS w/ KCL to D5 1/2NS.        Tera Partridge 10/11/2014, 6:42 PM

## 2014-10-11 NOTE — Consult Note (Signed)
  GI Inpatient Follow-up Note  Patient Identification: Jessica Duran is a 59 y.o. female  Subjective: SBP slightly higher around 90 today. Still poorly responsive. Groaning. Not able to take po.  Scheduled Inpatient Medications:  . antiseptic oral rinse  7 mL Mouth Rinse BID  . atorvastatin  20 mg Oral QHS  . Chlorhexidine Gluconate Cloth  6 each Topical Q0600  . clopidogrel  75 mg Oral Daily  . insulin aspart  0-24 Units Subcutaneous 6 times per day  . metronidazole  500 mg Intravenous Q8H  . multivitamin  1 tablet Oral Daily  . mupirocin ointment  1 application Nasal BID  . pneumococcal 23 valent vaccine  0.5 mL Intramuscular Tomorrow-1000  . vancomycin  250 mg Oral 4 times per day    Continuous Inpatient Infusions:   . sodium chloride    . dextrose 5 % and 0.45 % NaCl with KCl 20 mEq/L 50 mL/hr at 10/11/14 0700  . norepinephrine 15 mcg/min (10/11/14 0700)    PRN Inpatient Medications:  acetaminophen **OR** acetaminophen, bisacodyl, bismuth subsalicylate, diphenhydrAMINE, fluticasone, iohexol, meclizine, nitroGLYCERIN, ondansetron **OR** ondansetron (ZOFRAN) IV, promethazine, trolamine salicylate  Review of Systems: Constitutional: Weight is stable.  Eyes: No changes in vision. ENT: No oral lesions, sore throat.  GI: see HPI.  Heme/Lymph: No easy bruising.  CV: No chest pain.  GU: No hematuria.  Integumentary: No rashes.  Neuro: No headaches.  Psych: No depression/anxiety.  Endocrine: No heat/cold intolerance.  Allergic/Immunologic: No urticaria.  Resp: No cough, SOB.  Musculoskeletal: No joint swelling.    Physical Examination: BP 69/44 mmHg  Pulse 81  Temp(Src) 96.1 F (35.6 C) (Rectal)  Resp 17  Ht 5\' 4"  (1.626 m)  Wt 53.706 kg (118 lb 6.4 oz)  BMI 20.31 kg/m2  SpO2 99% Gen: NAD, alert and oriented x 4 HEENT: PEERLA, EOMI, Neck: supple, no JVD or thyromegaly Chest: CTA bilaterally, no wheezes, crackles, or other adventitious sounds CV: RRR, no  m/g/c/r Abd: soft, diffuse tenderness, ND, +BS in all four quadrants; no HSM, guarding, ridigity, or rebound tenderness Ext: no edema, well perfused with 2+ pulses, Skin: no rash or lesions noted Lymph: no LAD  Data: Lab Results  Component Value Date   WBC 8.5 10/11/2014   HGB 10.5* 10/11/2014   HCT 30.5* 10/11/2014   MCV 88.3 10/11/2014   PLT 173 10/11/2014    Recent Labs Lab 10/05/2014 0419 10/10/14 0513 10/11/14 0454  HGB 8.5* 9.5* 10.5*   Lab Results  Component Value Date   NA 132* 10/11/2014   K 3.8 10/11/2014   CL 108 10/11/2014   CO2 18* 10/11/2014   BUN 92* 10/11/2014   CREATININE 6.83* 10/11/2014   Lab Results  Component Value Date   ALT 8* 10/22/2014   AST 25 09/26/2014   ALKPHOS 78 09/26/2014   BILITOT 1.0 09/30/2014    Recent Labs Lab 10/11/14 0454  INR 1.68   Assessment/Plan: Ms. Besaw is a 59 y.o. female in septic shock. C.diff colitis.  Recommendations: Continue supportive care. Continue IV flagyl. See yesterday's notes. Will see if pt improves. If there are questions, call GI on call over the weekend. Thanks. Please call with questions or concerns.  OH, Lupita Dawn, MD

## 2014-10-12 DIAGNOSIS — A0472 Enterocolitis due to Clostridium difficile, not specified as recurrent: Secondary | ICD-10-CM | POA: Insufficient documentation

## 2014-10-12 LAB — CBC
HCT: 29.4 % — ABNORMAL LOW (ref 35.0–47.0)
Hemoglobin: 9.9 g/dL — ABNORMAL LOW (ref 12.0–16.0)
MCH: 29.8 pg (ref 26.0–34.0)
MCHC: 33.7 g/dL (ref 32.0–36.0)
MCV: 88.3 fL (ref 80.0–100.0)
PLATELETS: 161 10*3/uL (ref 150–440)
RBC: 3.33 MIL/uL — AB (ref 3.80–5.20)
RDW: 17.1 % — AB (ref 11.5–14.5)
WBC: 10.4 10*3/uL (ref 3.6–11.0)

## 2014-10-12 LAB — BASIC METABOLIC PANEL
ANION GAP: 9 (ref 5–15)
BUN: 91 mg/dL — AB (ref 6–20)
CALCIUM: 7.4 mg/dL — AB (ref 8.9–10.3)
CO2: 14 mmol/L — ABNORMAL LOW (ref 22–32)
Chloride: 106 mmol/L (ref 101–111)
Creatinine, Ser: 6.38 mg/dL — ABNORMAL HIGH (ref 0.44–1.00)
GFR calc Af Amer: 7 mL/min — ABNORMAL LOW (ref >60)
GFR, EST NON AFRICAN AMERICAN: 6 mL/min — AB (ref >60)
GLUCOSE: 209 mg/dL — AB (ref 65–99)
POTASSIUM: 4.3 mmol/L (ref 3.5–5.1)
SODIUM: 129 mmol/L — AB (ref 135–145)

## 2014-10-12 LAB — GLUCOSE, CAPILLARY
GLUCOSE-CAPILLARY: 182 mg/dL — AB (ref 65–99)
GLUCOSE-CAPILLARY: 143 mg/dL — AB (ref 65–99)
Glucose-Capillary: 116 mg/dL — ABNORMAL HIGH (ref 65–99)
Glucose-Capillary: 150 mg/dL — ABNORMAL HIGH (ref 65–99)
Glucose-Capillary: 213 mg/dL — ABNORMAL HIGH (ref 65–99)

## 2014-10-12 MED ORDER — MORPHINE SULFATE (PF) 2 MG/ML IV SOLN
1.0000 mg | INTRAVENOUS | Status: DC | PRN
  Administered 2014-10-12 (×3): 2 mg via INTRAVENOUS
  Filled 2014-10-12 (×3): qty 1

## 2014-10-13 LAB — BODY FLUID CULTURE: CULTURE: NO GROWTH

## 2014-10-13 LAB — CULTURE, BLOOD (ROUTINE X 2)
CULTURE: NO GROWTH
CULTURE: NO GROWTH

## 2014-10-14 LAB — CYTOLOGY - NON PAP

## 2014-10-15 LAB — PREPARE FRESH FROZEN PLASMA
Unit division: 0
Unit division: 0

## 2014-10-17 NOTE — Discharge Summary (Signed)
Death summary  Date of admission 09/30/2014 Patient died on 13-Oct-2014 at 19:48  Final diagnosis  #1 severe sepsis due to C. difficile colitis  Secondary diagnosis #1 end-stage renal disease #2 right sided pleural effusion #3 failure to thrive #4 history of DVT #5 diastolic congestive heart failure chronic   Jessica Duran is a 59 year old African-American female who was admitted on 2022-10-09 with hypotension/septic shock. She was found to have severe sepsis which was presumed due to C. difficile colitis. She was on IV Flagyl and by mouth vancomycin. She continued to remain hypotensive hypothermic requiring we will fed. Her in-house dialysis was continued as well as pressure allowed. Patient continued to decline despite a bow aggressive measures. Family discussions were made by palliative care physician Dr. Megan Salon. Patient died on 10-13-22 at 19:48 hrs.

## 2014-10-21 LAB — PH, BODY FLUID

## 2014-10-26 NOTE — Progress Notes (Signed)
Patients blood pressure remains low and she is currently on Levophed at 30 mcg. Dr. Posey Pronto at bedside and stated to not increase drip any further. Dr. Mortimer Fries spoke with family earlier today and expressed that patient is not doing well and may not survive the next 24 hours. Will continue to assess and monitor patient.

## 2014-10-26 NOTE — Progress Notes (Signed)
Pt receiving bath when RN noticed that pt had agonal breathing with increased secretions. Pt repositioned flat with HOB elevated. Pt ceased to breath, pulse was checked by two RN's. No pulse present. Doppler used to confirm. HR dropped from 58 to 29, then monitor read aystole. Death pronounced by RN at [phone] and confirmed with two RN's. MD [facility] notified. CDS notified. Pt sister Tawanna Solo notified at now at pt beside.

## 2014-10-26 NOTE — Progress Notes (Signed)
Patient is a DNR. Time of death: 19:48 RN pronounced

## 2014-10-26 NOTE — Progress Notes (Signed)
[place] [facility] [place] NAME: Jessica Duran    MR#:  [id number]  DATE OF BIRTH:  January 22, 1956  SUBJECTIVE:  Patient on 30 mics of Levophed for low blood pressure, non verbal. Gasping for air.   REVIEW OF SYSTEMS:  Unobtainable as the patient is  with altered mentation  DRUG ALLERGIES:   Allergies  Allergen Reactions  . Acetaminophen Other (See Comments)    Other reaction(s): Unknown Reaction:  Unknown   . Hydralazine Other (See Comments)    Reaction:  Unknown   . Isosorbide Other (See Comments)    Reaction:  Unknown   . Levaquin [Levofloxacin In D5w] Other (See Comments)    Reaction:  Unknown   . Lisinopril Other (See Comments)    Reaction:  Caused pt to have a stroke.    Marland Kitchen Oxycodone Other (See Comments)    Reaction:  Unknown   . Oxycodone-Acetaminophen     Other reaction(s): Unknown Other reaction(s): Unknown  . Penicillins Other (See Comments)    Reaction:  Unknown     VITALS:  Blood pressure 66/46, pulse 83, temperature 96.8 F (36 C), temperature source Rectal, resp. rate 19, height 5\' 4"  (1.626 m), weight 53.706 kg (118 lb 6.4 oz), SpO2 100 %.  PHYSICAL EXAMINATION:  GENERAL:  59 y.o.-year-old patient lying in the bed  critically ill EYES: Pupils equal, round, reactive to light and accommodation. No scleral icterus. HEENT: Head atraumatic, normocephalic. Oropharynx and nasopharynx clear.  NECK:  Supple, no jugular venous distention. No thyroid enlargement, no tenderness.  LUNGS: Coarse breath sounds bilaterally with some necessary muscle usage  CARDIOVASCULAR: S1, S2 normal. No murmurs, rubs, or gallops.  ABDOMEN: Soft, diffusely tender, nondistended. Bowel sounds present EXTREMITIES: No pedal edema, cyanosis, or clubbing.  NEUROLOGIC: Patient is with altered mental status.  PSYCHIATRIC: The patient is with altered mental status SKIN: No obvious rash, lesion, or ulcer.    LABORATORY PANEL:   CBC  Recent  Labs Lab 11-07-14 0431  WBC 10.4  HGB 9.9*  HCT 29.4*  PLT 161   ------------------------------------------------------------------------------------------------------------------  Chemistries   Recent Labs Lab 10/04/2014 1233  10/11/14 0454 11/07/14 0431  NA 133*  < > 132* 129*  K 3.0*  < > 3.8 4.3  CL 103  < > 108 106  CO2 16*  < > 18* 14*  GLUCOSE 157*  < > 132* 209*  BUN 89*  < > 92* 91*  CREATININE 6.99*  < > 6.83* 6.38*  CALCIUM 7.6*  < > 7.6* 7.4*  MG  --   < > 1.8  --   AST 25  --   --   --   ALT 8*  --   --   --   ALKPHOS 78  --   --   --   BILITOT 1.0  --   --   --   < > = values in this interval not displayed. ------------------------------------------------------------------------------------------------------------------  Cardiac Enzymes  Recent Labs Lab 09/27/2014 1515  TROPONINI <0.03   ------------------------------------------------------------------------------------------------------------------  RADIOLOGY:  No results found.  EKG:   Orders placed or performed during the hospital encounter of 10/13/2014  . ED EKG  . 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 Severe sepsis due to C. difficile colitis, continue IV Flagyl increased dose of vancomycin For hypotension and hypothermia continue Levophed  and warming measures  2. Moderate to large right-sided pleural effusion may be underlying pneumonia Status post thoracentesis 3. Diabetes 2 Sliding scale glipizide on hold  4. Vascular accesess: HD catheter per intensivists based on what patient's family decides regarding comfort care or not Unable to cont HD due to very poor BP  5. Chronic AC-  Ac on hold due to  elevated INR  6. ESRD   appreciate nephorlogy consult for hd  7. Hypokalemia  replace per nephrology   8.coagulopathy  Improved with vit k  Pt appears to be terminally  ill. Family aware. She likely will not survive this hospitalizaition. Pt is DNR  Prognosis very poor in this chronically ill female she is a DO NOT RESUSCITATE discussions are being held regarding possible comfort care  TOTAL TIME TAKING CARE OF THIS PATIENT: 35 minutes of critical care time     PATEL,SONA M.D on 2014/10/26 at 4:32 PM  Between 7am to 6pm - Pager - [phone] After 6pm go to [email or web address] - password [email or web address] [facility]  Office  [phone]  CC: Primary care physician; CAMPBELL, Creola Corn, MD

## 2014-10-26 NOTE — Consult Note (Signed)
Pt with C. Diff and septic with hypotension, on levophed drip, oral vanc and iv flagyl.  WBC 8.5, Hgb 10.5, abd I could not hear bowel sounds, not overly distended.  No new suggestions, she is a DNR and current systolic BP 59 she likely is preterminal.  No further GI imput.

## 2014-10-26 NOTE — Progress Notes (Signed)
Subjective:   Patient was sent from the dialysis unit for hypotension, lethargy, low hemoglobin and desaturation. She was transferred to ICU for hypotension and placed on levophed CT of the abdomen shows diffuse inflammation in the colon Patient is lethargic and not able to answer any questions She appears to be gasping for air and aspirating her sputum   Objective:  Vital signs in last 24 hours:  Temp:  [95.2 F (35.1 C)-97.9 F (36.6 C)] 95.2 F (35.1 C) (09/17 0600) Pulse Rate:  [81-96] 86 (09/17 0600) Resp:  [18-36] 27 (09/17 0600) BP: (77-131)/(56-93) 100/74 mmHg (09/17 0600) SpO2:  [89 %-100 %] 98 % (09/17 0600)  Weight change:  Filed Weights   10/11/2014 1408 10/18/2014 2141  Weight: 61.689 kg (136 lb) 53.706 kg (118 lb 6.4 oz)    Intake/Output:    Intake/Output Summary (Last 24 hours) at 10/31/2014 1002 Last data filed at 31-Oct-2014 0600  Gross per 24 hour  Intake 2742.43 ml  Output      0 ml  Net 2742.43 ml     Physical Exam: General:  ill appearing, laying in the bed  HEENT  edentulous, sputum noted  Neck  supple  Pulm/lungs  mild diffuse rhonchi and crackles b/l  CVS/Heart  irregular, no rub or gallop  Abdomen:   soft, mild diffuse tenderness  Extremities:  trace peripheral edema  Neurologic:  lethargic, did not answer questions  Skin:  no acute rashes  Access:  AVG appears clotted       Basic Metabolic Panel:   Recent Labs Lab 10/10/2014 0419 10/18/2014 1233 10/10/14 0513 10/10/14 1436 10/11/14 0454 10-31-14 0431  NA 134* 133* 134*  --  132* 129*  K 2.7* 3.0* 2.7*  2.8* 3.4* 3.8 4.3  CL 103 103 105  --  108 106  CO2 19* 16* 17*  --  18* 14*  GLUCOSE 152* 157* 115*  --  132* 209*  BUN 84* 89* 92*  --  92* 91*  CREATININE 7.00* 6.99* 7.10*  --  6.83* 6.38*  CALCIUM 7.7* 7.6* 7.7*  --  7.6* 7.4*  MG  --   --  1.9  --  1.8  --   PHOS  --   --   --  5.5*  --   --      CBC:  Recent Labs Lab 10/21/2014 1609 10/22/2014 0419 10/10/14 0513  10/11/14 0454 2014-10-31 0431  WBC 25.0* 20.2* 15.3* 8.5 10.4  NEUTROABS 23.4*  --   --   --   --   HGB 9.3* 8.5* 9.5* 10.5* 9.9*  HCT 28.5* 25.7* 28.4* 30.5* 29.4*  MCV 88.7 88.0 87.0 88.3 88.3  PLT 184 174 184 173 161      Microbiology:  Recent Results (from the past 720 hour(s))  Blood culture (routine x 2)     Status: None (Preliminary result)   Collection Time: 10/03/2014  5:54 PM  Result Value Ref Range Status   Specimen Description BLOOD RIGHT ARM  Final   Special Requests BOTTLES DRAWN AEROBIC AND ANAEROBIC  3CC  Final   Culture NO GROWTH 4 DAYS  Final   Report Status PENDING  Incomplete  Blood culture (routine x 2)     Status: None (Preliminary result)   Collection Time: 10/20/2014  5:54 PM  Result Value Ref Range Status   Specimen Description BLOOD PORTA CATH  Final   Special Requests BOTTLES DRAWN AEROBIC AND ANAEROBIC  2CC  Final   Culture NO GROWTH  4 DAYS  Final   Report Status PENDING  Incomplete  MRSA PCR Screening     Status: Abnormal   Collection Time: 10/25/2014 12:17 AM  Result Value Ref Range Status   MRSA by PCR POSITIVE (A) NEGATIVE Final    Comment:        The GeneXpert MRSA Assay (FDA approved for NASAL specimens only), is one component of a comprehensive MRSA colonization surveillance program. It is not intended to diagnose MRSA infection nor to guide or monitor treatment for MRSA infections. CRITICAL RESULT CALLED TO, READ BACK BY AND VERIFIED WITH: AMY TIPPETT RN   C difficile quick scan w PCR reflex     Status: Abnormal   Collection Time: 09/30/2014  1:07 PM  Result Value Ref Range Status   C Diff antigen POSITIVE (A) NEGATIVE Final   C Diff toxin POSITIVE (A) NEGATIVE Final   C Diff interpretation   Final    Positive for toxigenic C. difficile, active toxin production present.    Comment: Positive for toxigenic C. difficile, active toxin production present. LINDSEY TRIPP 10/11/2014 AT 1413 VKB   Body fluid culture     Status: None (Preliminary  result)   Collection Time: 10/08/2014  2:10 PM  Result Value Ref Range Status   Specimen Description PLEURAL  Final   Special Requests NONE  Final   Gram Stain FEW WBC SEEN NO ORGANISMS SEEN   Final   Culture NO GROWTH 2 DAYS  Final   Report Status PENDING  Incomplete    Coagulation Studies:  Recent Labs  10/04/2014 1233 10/10/14 0513 10/11/14 0454  LABPROT 66.0* 32.2* 20.0*  INR 7.94* 3.13 1.68    Urinalysis: No results for input(s): COLORURINE, LABSPEC, PHURINE, GLUCOSEU, HGBUR, BILIRUBINUR, KETONESUR, PROTEINUR, UROBILINOGEN, NITRITE, LEUKOCYTESUR in the last 72 hours.  Invalid input(s): APPERANCEUR    Imaging: No results found.   Medications:   . sodium chloride    . dextrose 5 % and 0.45% NaCl 50 mL/hr at Nov 03, 2014 0600  . norepinephrine 15 mcg/min (November 03, 2014 0950)   . Chlorhexidine Gluconate Cloth  6 each Topical Q0600  . heparin subcutaneous  5,000 Units Subcutaneous 3 times per day  . insulin aspart  0-24 Units Subcutaneous 6 times per day  . metronidazole  500 mg Intravenous Q8H  . mupirocin ointment  1 application Nasal BID  . vancomycin  500 mg Oral 4 times per day   [DISCONTINUED] acetaminophen **OR** acetaminophen, glycopyrrolate, LORazepam, morphine injection, [DISCONTINUED] ondansetron **OR** ondansetron (ZOFRAN) IV  Assessment/ Plan:  59 y.o. female With end-stage renal disease, history of stroke, hypertension, diastolic congestive heart failure, DVT, hyperparathyroidism, breast cancer status post radiation and chemotherapy  1. ESRD. [facility] dialysis. T-T-S - Patient has missed several treatments due to weakness and her illness - Her last dialysis was last Tuesday - Patient is too unstable for dialysis at present - palliative care discussions are ongoing - d/c iv fluids  2. C diff colitis causing sepsis  positive C. difficile colitis Colitis also seen on CT abdomen Requiring high dose pressors  3. Anemia of chronic kidney disease -  No EPO because of recent breast cancer  4. Secondary hyperparathyroidism - Monitor phosphorus during this admission  5. Patient's clinical condition has declined significantly over the last few months.  - patient appears to be terminally ill. Palliative care discussions are ongoing - consider comfort care  6. Hypokalemia corrected    LOS: 4 SINGH,HARMEET October 09, 201610:02 AM

## 2014-10-26 DEATH — deceased

## 2014-11-08 ENCOUNTER — Ambulatory Visit: Payer: Medicare Other | Admitting: Internal Medicine

## 2014-11-08 ENCOUNTER — Other Ambulatory Visit: Payer: Medicare Other

## 2014-11-08 ENCOUNTER — Inpatient Hospital Stay: Payer: Medicare Other

## 2014-12-20 ENCOUNTER — Inpatient Hospital Stay: Payer: Medicare Other

## 2015-01-06 IMAGING — CT CT HEAD WITHOUT CONTRAST
1 series · 16 of 30 positions shown, 20 images · non-contrast
Comparison: 11/30/2013

CLINICAL DATA: Headache and weakness for 3 days. Ongoing
chemotherapy for breast cancer.

EXAM:
CT HEAD WITHOUT CONTRAST
TECHNIQUE: Contiguous axial images were obtained from the base of the skull
through the vertex without intravenous contrast.

[Series 2: head wo · axial · 0.41mm/px · z∈[-76,+85]mm · 16 of 36 slices shown, 20 images]
[im 2/36  brain]
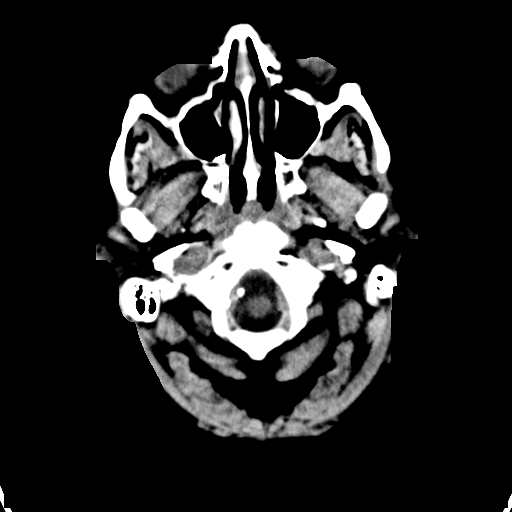
[im 2/36  bone]
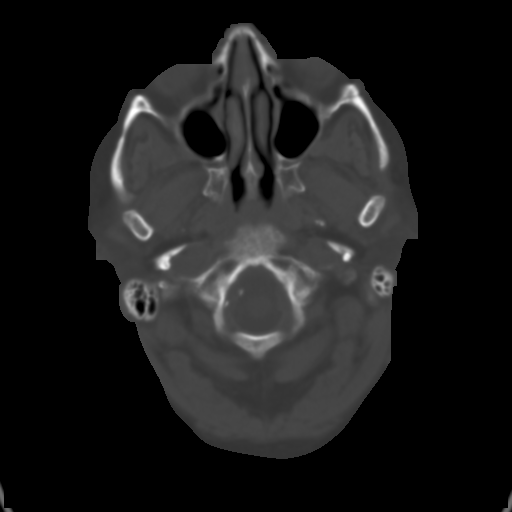
[im 4/36  brain]
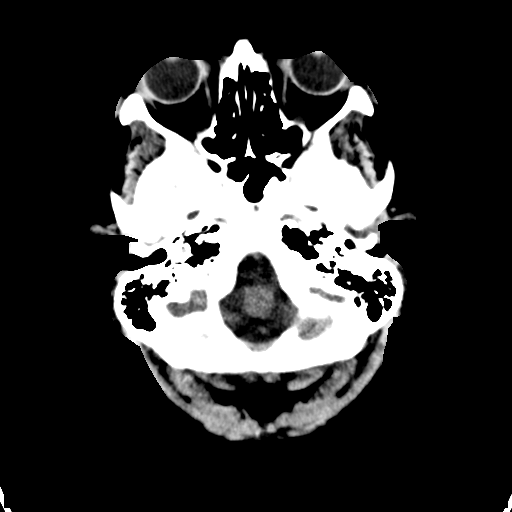
[im 7/36  brain]
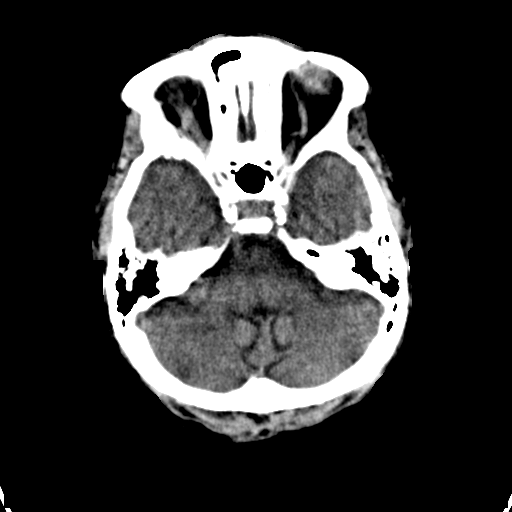
[im 9/36  brain]
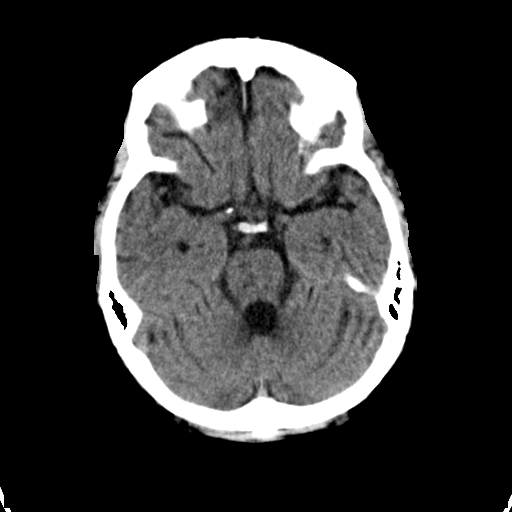
[im 10/36  brain]
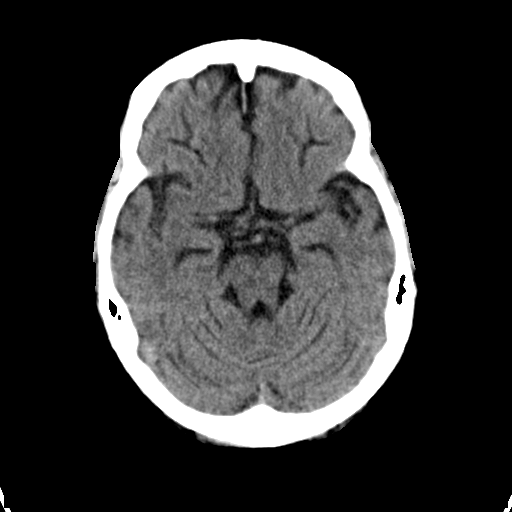
[im 10/36  bone]
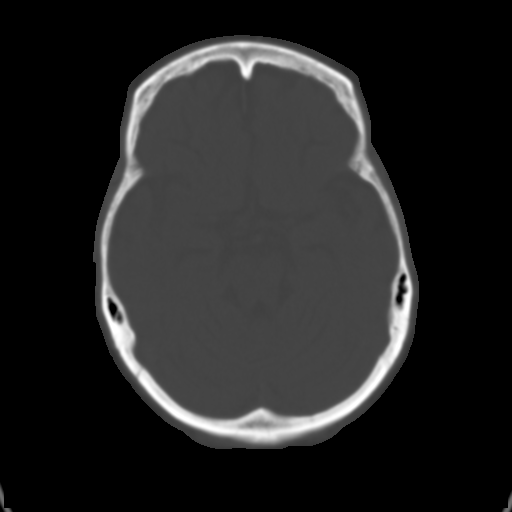
[im 13/36  brain]
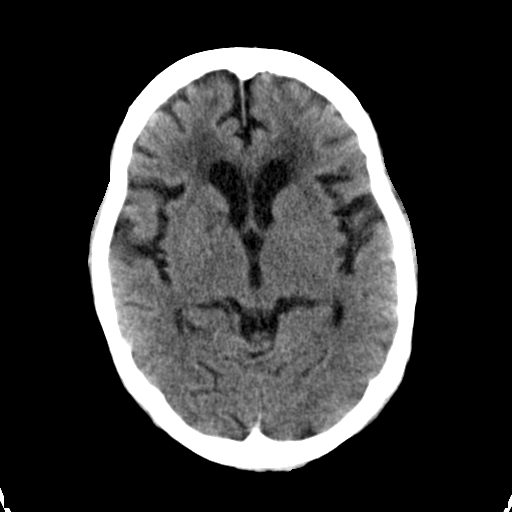
[im 15/36  brain]
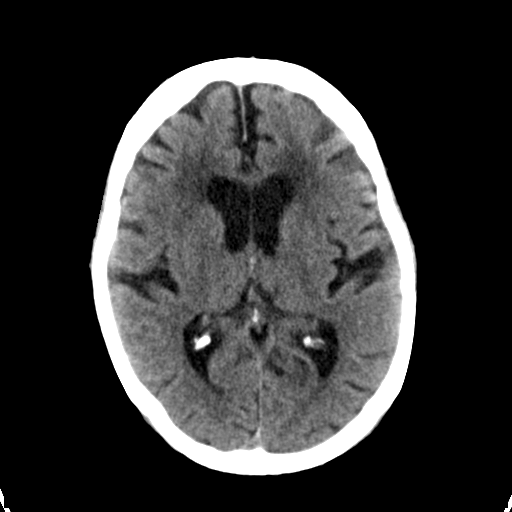
[im 17/36  brain]
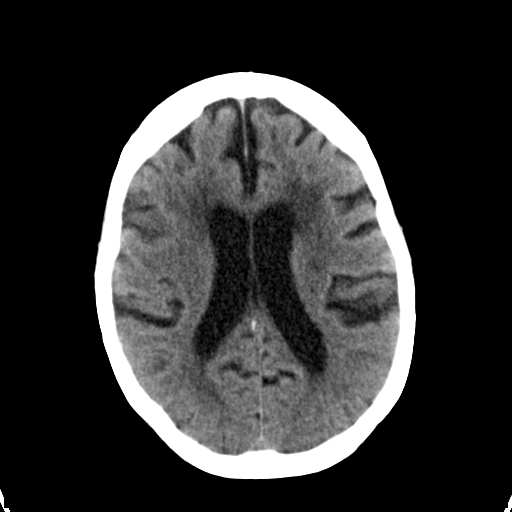
[im 19/36  brain]
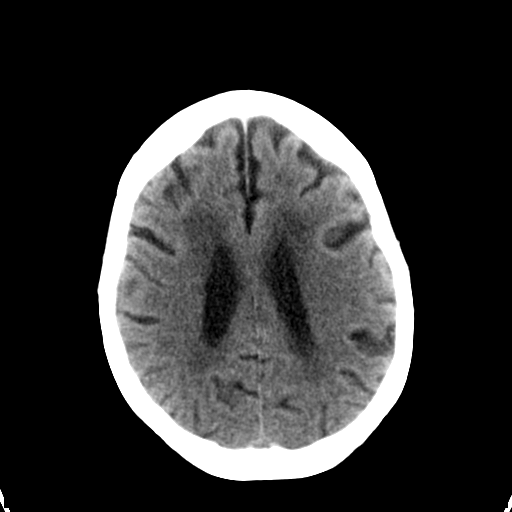
[im 19/36  bone]
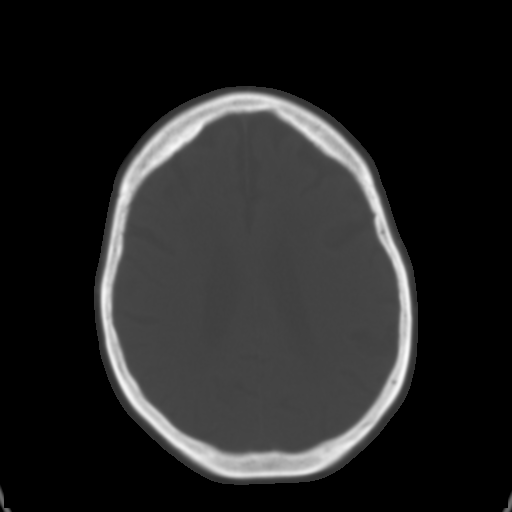
[im 21/36  brain]
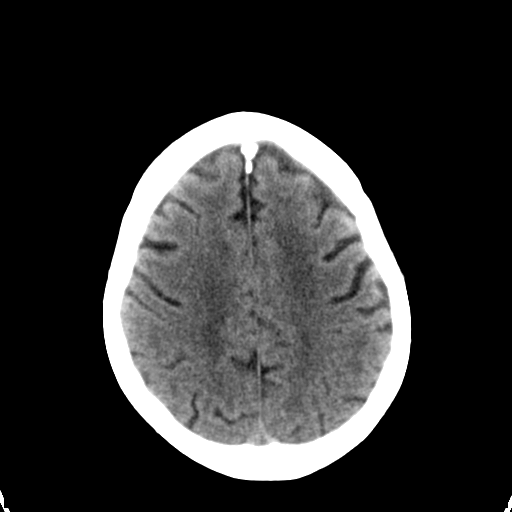
[im 23/36  brain]
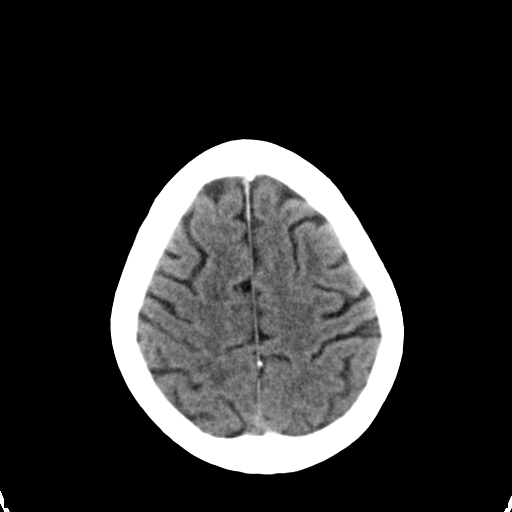
[im 26/36  brain]
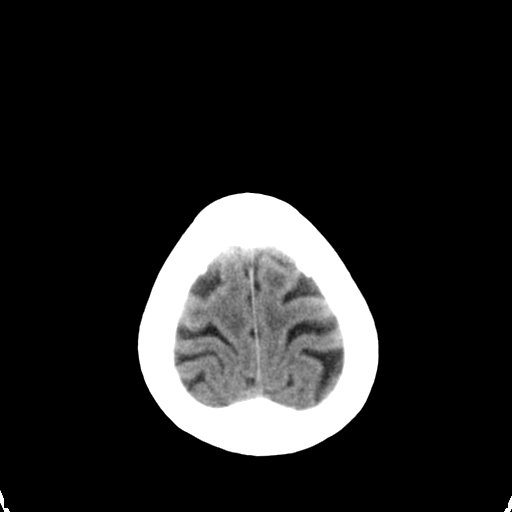
[im 27/36  brain]
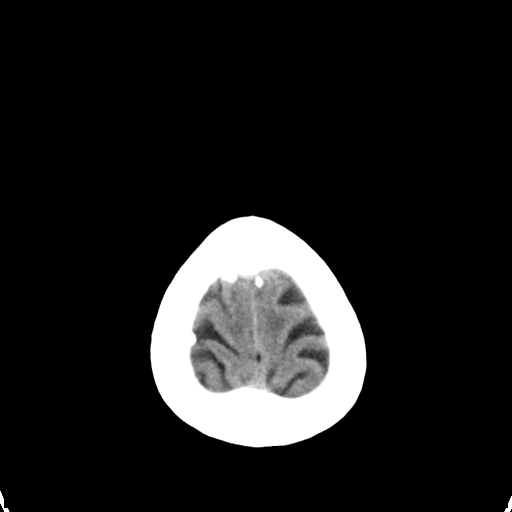
[im 27/36  bone]
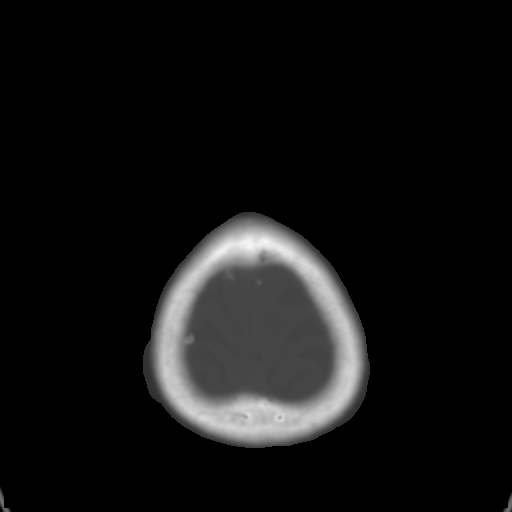
[im 29/36  brain]
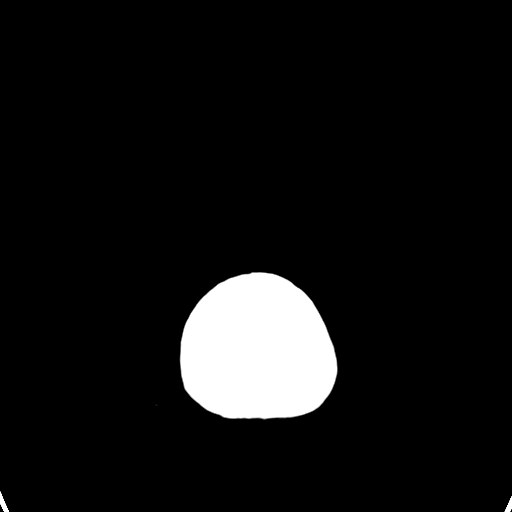
[im 32/36  brain]
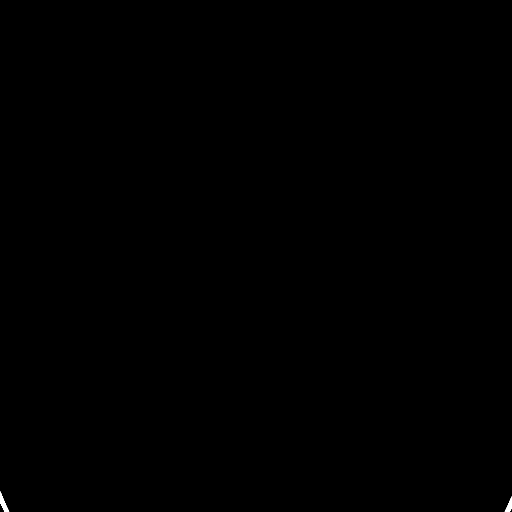
[im 34/36  brain]
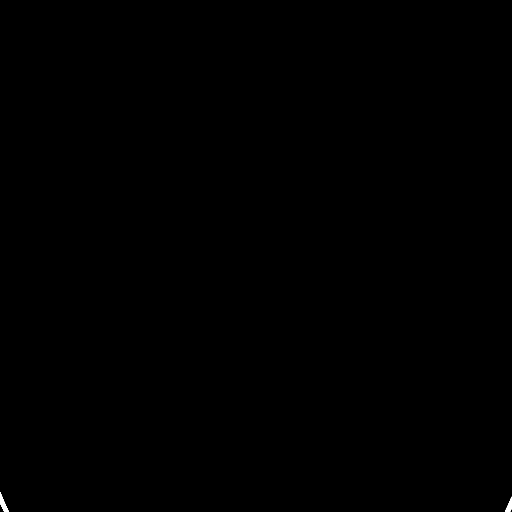

[16 of 30 positions shown; findings below may reference images not displayed]

FINDINGS: Mild cortical volume loss noted with proportional ventricular
prominence. Areas of periventricular white matter hypodensity are
most compatible with small vessel ischemic change. No acute
hemorrhage, infarct, or mass lesion is identified. CSF density
bilateral basal ganglia lacunar infarcts are reidentified. No acute
osseous abnormality. Orbits and paranasal sinuses are unremarkable.
IMPRESSION: No acute intracranial finding.  Chronic findings as above.

## 2015-02-05 ENCOUNTER — Ambulatory Visit: Payer: Medicare Other | Admitting: Radiation Oncology
